# Patient Record
Sex: Female | Born: 1970 | Race: White | Hispanic: No | Marital: Married | Smoking: Former smoker
Health system: Southern US, Community
[De-identification: ages and names within clinical notes are randomized; demographics above are authoritative.]

## PROBLEM LIST (undated history)

## (undated) DIAGNOSIS — I10 Essential (primary) hypertension: Secondary | ICD-10-CM

## (undated) DIAGNOSIS — D649 Anemia, unspecified: Secondary | ICD-10-CM

## (undated) DIAGNOSIS — F329 Major depressive disorder, single episode, unspecified: Secondary | ICD-10-CM

## (undated) DIAGNOSIS — F32A Depression, unspecified: Secondary | ICD-10-CM

## (undated) DIAGNOSIS — F419 Anxiety disorder, unspecified: Secondary | ICD-10-CM

## (undated) DIAGNOSIS — K219 Gastro-esophageal reflux disease without esophagitis: Secondary | ICD-10-CM

## (undated) DIAGNOSIS — J449 Chronic obstructive pulmonary disease, unspecified: Secondary | ICD-10-CM

## (undated) DIAGNOSIS — R011 Cardiac murmur, unspecified: Secondary | ICD-10-CM

## (undated) HISTORY — DX: Major depressive disorder, single episode, unspecified: F32.9

## (undated) HISTORY — DX: Chronic obstructive pulmonary disease, unspecified: J44.9

## (undated) HISTORY — PX: OTHER SURGICAL HISTORY: SHX169

## (undated) HISTORY — DX: Gastro-esophageal reflux disease without esophagitis: K21.9

## (undated) HISTORY — DX: Cardiac murmur, unspecified: R01.1

## (undated) HISTORY — DX: Anemia, unspecified: D64.9

## (undated) HISTORY — PX: CHOLECYSTECTOMY: SHX55

## (undated) HISTORY — DX: Essential (primary) hypertension: I10

## (undated) HISTORY — DX: Depression, unspecified: F32.A

## (undated) HISTORY — DX: Anxiety disorder, unspecified: F41.9

---

## 1997-12-11 ENCOUNTER — Emergency Department (HOSPITAL_COMMUNITY): Admission: EM | Admit: 1997-12-11 | Discharge: 1997-12-11 | Payer: Self-pay | Admitting: Emergency Medicine

## 1998-08-21 ENCOUNTER — Emergency Department (HOSPITAL_COMMUNITY): Admission: EM | Admit: 1998-08-21 | Discharge: 1998-08-22 | Payer: Self-pay | Admitting: Emergency Medicine

## 1999-03-20 ENCOUNTER — Emergency Department (HOSPITAL_COMMUNITY): Admission: EM | Admit: 1999-03-20 | Discharge: 1999-03-20 | Payer: Self-pay

## 2002-01-05 ENCOUNTER — Emergency Department (HOSPITAL_COMMUNITY): Admission: EM | Admit: 2002-01-05 | Discharge: 2002-01-05 | Payer: Self-pay | Admitting: Emergency Medicine

## 2004-04-29 ENCOUNTER — Emergency Department (HOSPITAL_COMMUNITY): Admission: EM | Admit: 2004-04-29 | Discharge: 2004-04-29 | Payer: Self-pay | Admitting: Family Medicine

## 2007-01-27 ENCOUNTER — Emergency Department: Payer: Self-pay | Admitting: Emergency Medicine

## 2007-09-10 ENCOUNTER — Emergency Department (HOSPITAL_COMMUNITY): Admission: EM | Admit: 2007-09-10 | Discharge: 2007-09-10 | Payer: Self-pay | Admitting: Emergency Medicine

## 2010-02-23 ENCOUNTER — Encounter: Admission: RE | Admit: 2010-02-23 | Discharge: 2010-02-23 | Payer: Self-pay | Admitting: Internal Medicine

## 2010-11-13 ENCOUNTER — Other Ambulatory Visit: Payer: Self-pay | Admitting: Internal Medicine

## 2010-11-13 DIAGNOSIS — Z1231 Encounter for screening mammogram for malignant neoplasm of breast: Secondary | ICD-10-CM

## 2010-12-07 ENCOUNTER — Ambulatory Visit
Admission: RE | Admit: 2010-12-07 | Discharge: 2010-12-07 | Disposition: A | Discharge status: Home / Self Care | Payer: Medicaid Other | Source: Ambulatory Visit | Attending: Internal Medicine | Admitting: Internal Medicine

## 2010-12-07 DIAGNOSIS — Z1231 Encounter for screening mammogram for malignant neoplasm of breast: Secondary | ICD-10-CM

## 2011-03-18 LAB — I-STAT 8, (EC8 V) (CONVERTED LAB)
Chloride: 105
Glucose, Bld: 147 — ABNORMAL HIGH
Potassium: 3.3 — ABNORMAL LOW
pCO2, Ven: 38.3 — ABNORMAL LOW
pH, Ven: 7.453 — ABNORMAL HIGH

## 2015-02-07 ENCOUNTER — Ambulatory Visit (INDEPENDENT_AMBULATORY_CARE_PROVIDER_SITE_OTHER): Payer: Self-pay

## 2015-02-07 ENCOUNTER — Observation Stay (HOSPITAL_COMMUNITY)
Admission: EM | Admit: 2015-02-07 | Discharge: 2015-02-09 | Disposition: A | Discharge status: Home / Self Care | Payer: Self-pay | Attending: Internal Medicine | Admitting: Internal Medicine

## 2015-02-07 ENCOUNTER — Encounter (HOSPITAL_COMMUNITY): Payer: Self-pay | Admitting: *Deleted

## 2015-02-07 ENCOUNTER — Ambulatory Visit (INDEPENDENT_AMBULATORY_CARE_PROVIDER_SITE_OTHER): Payer: Self-pay | Admitting: Family Medicine

## 2015-02-07 VITALS — BP 208/102 | HR 78 | Temp 98.4°F | Resp 18 | Ht 62.0 in | Wt 214.0 lb

## 2015-02-07 DIAGNOSIS — D649 Anemia, unspecified: Secondary | ICD-10-CM | POA: Insufficient documentation

## 2015-02-07 DIAGNOSIS — R519 Headache, unspecified: Secondary | ICD-10-CM

## 2015-02-07 DIAGNOSIS — R011 Cardiac murmur, unspecified: Secondary | ICD-10-CM | POA: Insufficient documentation

## 2015-02-07 DIAGNOSIS — I161 Hypertensive emergency: Secondary | ICD-10-CM

## 2015-02-07 DIAGNOSIS — R0902 Hypoxemia: Secondary | ICD-10-CM | POA: Insufficient documentation

## 2015-02-07 DIAGNOSIS — R0609 Other forms of dyspnea: Secondary | ICD-10-CM

## 2015-02-07 DIAGNOSIS — I10 Essential (primary) hypertension: Secondary | ICD-10-CM

## 2015-02-07 DIAGNOSIS — J449 Chronic obstructive pulmonary disease, unspecified: Secondary | ICD-10-CM | POA: Insufficient documentation

## 2015-02-07 DIAGNOSIS — R9431 Abnormal electrocardiogram [ECG] [EKG]: Secondary | ICD-10-CM

## 2015-02-07 DIAGNOSIS — Z8249 Family history of ischemic heart disease and other diseases of the circulatory system: Secondary | ICD-10-CM

## 2015-02-07 DIAGNOSIS — K219 Gastro-esophageal reflux disease without esophagitis: Secondary | ICD-10-CM | POA: Insufficient documentation

## 2015-02-07 DIAGNOSIS — Z79899 Other long term (current) drug therapy: Secondary | ICD-10-CM | POA: Insufficient documentation

## 2015-02-07 DIAGNOSIS — R51 Headache: Secondary | ICD-10-CM | POA: Insufficient documentation

## 2015-02-07 DIAGNOSIS — Z720 Tobacco use: Secondary | ICD-10-CM | POA: Insufficient documentation

## 2015-02-07 DIAGNOSIS — F329 Major depressive disorder, single episode, unspecified: Secondary | ICD-10-CM | POA: Insufficient documentation

## 2015-02-07 DIAGNOSIS — R002 Palpitations: Secondary | ICD-10-CM

## 2015-02-07 DIAGNOSIS — I160 Hypertensive urgency: Secondary | ICD-10-CM | POA: Diagnosis present

## 2015-02-07 DIAGNOSIS — F419 Anxiety disorder, unspecified: Secondary | ICD-10-CM | POA: Insufficient documentation

## 2015-02-07 LAB — GLUCOSE, POCT (MANUAL RESULT ENTRY): POC Glucose: 102 mg/dl — AB (ref 70–99)

## 2015-02-07 LAB — EKG 12-LEAD

## 2015-02-07 MED ORDER — LORAZEPAM 2 MG/ML IJ SOLN
1.0000 mg | Freq: Once | INTRAMUSCULAR | Status: AC
  Administered 2015-02-08: 1 mg via INTRAVENOUS
  Filled 2015-02-07: qty 1

## 2015-02-07 MED ORDER — OXYCODONE-ACETAMINOPHEN 5-325 MG PO TABS
1.0000 | ORAL_TABLET | Freq: Once | ORAL | Status: AC
  Administered 2015-02-07: 1 via ORAL

## 2015-02-07 MED ORDER — OXYCODONE-ACETAMINOPHEN 5-325 MG PO TABS
ORAL_TABLET | ORAL | Status: AC
  Filled 2015-02-07: qty 1

## 2015-02-07 NOTE — ED Notes (Signed)
Pt states she took 50 mg metoprolol this morning at 11am.

## 2015-02-07 NOTE — Patient Instructions (Signed)
Go to [facility] emergency room for your blood pressure, value of her headache, and possible abnormal EKG. If any changes between now and your evaluation at the emergency room call 911. If follow-up needed after emergency room, call your primary care provider or you can return here if needed. I did advise the charge nurse you were on the way.

## 2015-02-07 NOTE — ED Provider Notes (Signed)
CSN: [id number]     Arrival date & time 02/07/15  2044 History  This chart was scribed for Varney Biles, MD by Hansel Feinstein, ED Scribe. This patient was seen in room A12C/A12C and the patient's care was started at 11:33 PM.     Chief Complaint  Patient presents with  . Hypertension   The history is provided by the patient. No language interpreter was used.    HPI Comments: Pamela Ho is a 44 y.o. female with hx of HTN, GERD, anxiety, depression, COPD referred by Urgent Care who presents to the Emergency Department complaining of moderate HTN (203/98 at home, 227/95 on arrival) onset tonight. She states associated diaphoresis onset 3 months ago and worsened 3 days ago (pt attributes this to anxiety and heat), intermittent blurry vision (with HA, but none right now), throbbing intermittent HA "worst of her life" localized to occipital and temporal region (relieved completely by Percocet on arrival) onset one month ago, recent unintentional weight loss (15lbs in the past month). Per pt, she checked her BP at a Pharmacy yesterday and it came back very high, so she went to Urgent Care. Pt notes that she woke up with her current HA and took 4 Ibuprofen throughout the day with moderate relief. She is a current smoker, 2ppd, and notes that she has not finished a pack today. She notes that she began smoking heavily 25 years ago, with 2 years of 2 ppd. Pt reports night sweats onset 3 years ago, but no new changes. Pt notes a recent change in corrective lenses Rx. No Hx of cerebral hemorrhage, DM, stroke, MI. FHx of cerebral hemorrhage on paternal grandmothers side after chemotherapy, but no FHx of cancer<40yo. Pt takes Zoloft and Klonopin for anxiety, Metoprolol for HTN. She denies nausea, vomiting, lightheadedness with the HA. She also denies CP, SOB, back pain.   With the headaches, there are no nausea, vomiting, visual complains, seizures, altered mental status, loss of consciousness, new weakness, or  numbness, no gait instability.   Past Medical History  Diagnosis Date  . Anemia   . Anxiety   . COPD (chronic obstructive pulmonary disease)   . Depression   . GERD (gastroesophageal reflux disease)   . Heart murmur   . Hypertension    Past Surgical History  Procedure Laterality Date  . Cholecystectomy     Family History  Problem Relation Age of Onset  . Diabetes Mother   . Heart disease Mother   . Hyperlipidemia Mother   . Diabetes Father   . Cancer Father   . Heart disease Father   . Hyperlipidemia Father   . Hypertension Father   . Heart disease Maternal Grandmother   . Hyperlipidemia Maternal Grandmother   . Hypertension Maternal Grandmother   . Heart disease Maternal Grandfather   . Hyperlipidemia Maternal Grandfather   . Hypertension Maternal Grandfather   . Heart disease Paternal Grandmother   . Hyperlipidemia Paternal Grandmother   . Hypertension Paternal Grandmother   . Stroke Paternal Grandmother   . Diabetes Paternal Grandmother   . Cancer Paternal Grandmother    Social History  Substance Use Topics  . Smoking status: Current Every Day Smoker -- 1.50 packs/day for 30 years    Types: Cigarettes  . Smokeless tobacco: None  . Alcohol Use: No   OB History    No data available     Review of Systems  Constitutional: Positive for diaphoresis and unexpected weight change.  Eyes: Positive for visual disturbance.  Respiratory: Negative for shortness of breath.   Cardiovascular: Negative for chest pain.       HTN  Gastrointestinal: Negative for nausea and vomiting.  Musculoskeletal: Negative for back pain.  Neurological: Positive for headaches. Negative for light-headedness.  A complete 10 system review of systems was obtained and all systems are negative except as noted in the HPI and PMH.    Allergies  Review of patient's allergies indicates no known allergies.  Home Medications   Prior to Admission medications   Medication Sig Start Date End Date  Taking? Authorizing Provider  clonazePAM (KLONOPIN) 0.5 MG tablet Take 0.5 mg by mouth 2 (two) times daily as needed for anxiety.   Yes Historical Provider, MD  ibuprofen (ADVIL,MOTRIN) 200 MG tablet Take 400 mg by mouth every 6 (six) hours as needed for mild pain or moderate pain.   Yes Historical Provider, MD  IRON PO Take 1 tablet by mouth daily.    Yes Historical Provider, MD  metoprolol (LOPRESSOR) 50 MG tablet Take 50 mg by mouth daily.   Yes Historical Provider, MD  omeprazole (PRILOSEC) 20 MG capsule Take 20 mg by mouth daily.   Yes Historical Provider, MD  pravastatin (PRAVACHOL) 20 MG tablet Take 20 mg by mouth daily.   Yes Historical Provider, MD  sertraline (ZOLOFT) 100 MG tablet Take 150 mg by mouth daily.   Yes Historical Provider, MD   BP 194/84 mmHg  Pulse 72  Temp(Src) 97.9 F (36.6 C) (Oral)  Resp 24  Ht _0  (1.575 m)  Wt 214 lb (97.07 kg)  BMI 39.13 kg/m2  SpO2 92%  LMP 10/04/2014 (Approximate) Physical Exam  Constitutional: She is oriented to person, place, and time. She appears well-developed and well-nourished.  HENT:  Head: Normocephalic and atraumatic.  Eyes: Conjunctivae and EOM are normal. Pupils are equal, round, and reactive to light.  Pupils are 17m and equal, reactive to light.  Neck: Normal range of motion. Neck supple.  Cardiovascular: Normal rate, regular rhythm and normal heart sounds.   No murmurs appreciated. 2+ and equal radial pulses bilaterally. 1+ and equal DP.  Pulmonary/Chest: Effort normal. No respiratory distress. She has rales.  Rales at the bases.   Abdominal: She exhibits no distension.  Musculoskeletal: Normal range of motion.  Neurological: She is alert and oriented to person, place, and time.  Cranial nerves 2-12 intact. Sensory exam for BUE and BLE equal and normal. Finger to nose exam normal.  Skin: Skin is warm and dry.  Psychiatric: She has a normal mood and affect. Her behavior is normal.  Nursing note and vitals  reviewed.  ED Course  Procedures (including critical care time) DIAGNOSTIC STUDIES: Oxygen Saturation is 94% on RA, adequate by my interpretation.    COORDINATION OF CARE: 11:45 PM Discussed treatment plan with pt at bedside and pt agreed to plan.   Labs Review Labs Reviewed  CBC WITH DIFFERENTIAL/PLATELET - Abnormal; Notable for the following:    RBC 6.01 (*)    Hemoglobin 16.1 (*)    HCT 51.4 (*)    RDW 18.4 (*)    Neutrophils Relative % 79 (*)    All other components within normal limits  BASIC METABOLIC PANEL - Abnormal; Notable for the following:    Glucose, Bld 129 (*)    All other components within normal limits  BRAIN NATRIURETIC PEPTIDE - Abnormal; Notable for the following:    B Natriuretic Peptide 276.3 (*)    All other components within normal limits  PROTIME-INR  APTT  TROPONIN I  D-DIMER, QUANTITATIVE (NOT AT [facility])    Imaging Review Dg Chest 2 View  02/07/2015   CLINICAL DATA:  Dyspnea on exertion  EXAM: CHEST  2 VIEW  COMPARISON:  02/23/2010  FINDINGS: Borderline cardiomegaly is chronic, accentuated by hypoventilation. Negative aortic and hilar contours.  Chronic interstitial coarsening which appears bronchitic. There is no edema, consolidation, effusion, or pneumothorax.  IMPRESSION: Chronic bronchitic markings and borderline cardiomegaly. No acute superimposed process.   Electronically Signed   By: Monte Fantasia M.D.   On: 02/07/2015 21:13   Ct Head Wo Contrast  02/08/2015   CLINICAL DATA:  Headache and high blood pressure, acute onset. Initial encounter.  EXAM: CT HEAD WITHOUT CONTRAST  TECHNIQUE: Contiguous axial images were obtained from the base of the skull through the vertex without intravenous contrast.  COMPARISON:  None.  FINDINGS: There is no evidence of acute infarction, mass lesion, or intra- or extra-axial hemorrhage on CT.  The posterior fossa, including the cerebellum, brainstem and fourth ventricle, is within normal limits. The third and lateral  ventricles, and basal ganglia are unremarkable in appearance. The cerebral hemispheres are symmetric in appearance, with normal gray-white differentiation. No mass effect or midline shift is seen.  There is no evidence of fracture; visualized osseous structures are unremarkable in appearance. The orbits are within normal limits. The paranasal sinuses and mastoid air cells are well-aerated. No significant soft tissue abnormalities are seen.  IMPRESSION: Unremarkable noncontrast CT of the head.   Electronically Signed   By: Garald Balding M.D.   On: 02/08/2015 02:50   I have personally reviewed and evaluated these images and lab results as part of my medical decision-making.   EKG Interpretation   Date/Time:  Wednesday February 08 2015 00:10:37 EDT Ventricular Rate:  69 PR Interval:  127 QRS Duration: 96 QT Interval:  420 QTC Calculation: 450 R Axis:   92 Text Interpretation:  Sinus rhythm Biatrial enlargement Borderline right  axis deviation Minimal ST depression, diffuse leads avR has mild < 1 mm ST  elevation with ST depression in the lateral leads. No old tracing to  compare Confirmed by Leeyah Heather, MD, Alexandros Ewan [phone]) on 02/08/2015 12:16:01 AM      _0 :00 am - Pt's highest MAP was close to 157mhg. PT was given ativan - now her MAP is 116 (200/90). We will order iv labetalol and oral hydralazine. Pt also has hypoxia  - 86% on room air. Will add dimer to screen for PE. Pt will need admission for her hypoxia and HTN emergency. Pt has a headache - will get CT head. She is no longer diaphoretic and certainly looks more calm than the first time around.  MDM   Final diagnoses:  Hypertensive emergency  Anxiety  Hypoxia    I personally performed the services described in this documentation, which was scribed in my presence. The recorded information has been reviewed and is accurate.  Pt comes in with cc of headaches, sweats, elevated BP. She is taking metroprolol as prescribed. Headache has  been present for > 1 month, it was severe - but currently she is pain free. Pain is waxing and waning. She has no focal neuro deficits. She had some blurry vision at one point. DDX: SAH, Brain AN, Thrombosis - but i am favoring hypertensive headaches given she is pain free with percocets, and reports that she thinks that pain got worse as she didn't have access to ibuprofen in the waiting room that she  usually takes for her headaches. CT head is normal.  Pt is noted to be hypoxic. She has no hx of O2 needs. She is a smoker. Pt has no asthma like findngs on the exam and the CXR is clear. Ambulatory pulsox is in the low 80s. Will need further workup. Dimer is pending due to lab not adding it on - Dr. Hal Hope will f/u.  BP: MAP was 150 mmhg at it's peak here. With ativan - as there was an element of anxiety, BP came down to map around 120. i gave patient iv labetalol and oral hydralazine 20 and 25 mg respectively, and the BP has stayed elevated - 190/84 on reassessment (MAP 90 mmhg).     Varney Biles, MD 02/08/15 [phone]

## 2015-02-07 NOTE — Progress Notes (Addendum)
Subjective:   This chart was scribed for Pamela Ray, MD by Erling Conte, Medical Scribe. This patient was seen in Room 1 and the patient's care was started at 6:32 PM.   Patient ID: Pamela Ho, female    DOB: [birth year], 44 y.o.   MRN: [id number]  Chief Complaint  Patient presents with  . Hypertension    Check BP at pharmacy twice yesterday was around: 201/91, checked BP today at mother house: 203/98  . Depression    Per triage screening    HPI Pamela Ho is a 44 y.o. female   PCP: Dr. Jilda Panda  Pt here for elevated blood pressure. She is a new pt to our office.  Elevated Blood Pressure She reports recent elevated BP readings. She checked her BP at a pharmacy yesterday and it was running at 201/91 and she checked again today and it was 203/98. Pt reports associated intermittent HAs for 1 month. She states the HA is worse in the morning as soon as she wakes up. She states the pain is exacerbated by lying down and is relieved when she sits up. She has been taking Ibuprofen for HA relief and has been taking 1600 mg daily. She was initially taking Tylenol for the first 2 weeks but felt that the Ibuprofen helped more. Pt has a h/o of HTN and was previously taking spironolactone, which she went off of 3 months ago. She began taking metoprolol 3 months ago which she said was for her heart palpitations. She admits she has not been taking the medication as prescribed and has only been taking 22m 1x daily and she is supposed to be taking the 50 mg 2x daily. She states when she was on BP medication previously her BP readings were in the 120s/70s. Pt notes she has been having intermittent SOB with exertion for 1 year but contributes it more to her weight than being related to her high BP. She has a family h/o cardiac issues through both parents. Her mother was 698when she had a massive coronary and her father was in his late 475s Pt's maternal grandmother had an intercerebral  bleed. She denies any h/o cardiac workup. She admits to having a EKG 2 years ago which was normal with the exception of PVCs. She states that she has been having some intermittent diaphoresis which she contributes to her menopause symptoms but states it has gotten worse with her elevated BP. She denies any h/o DM or elevated blood sugar. She is current everyday smoker. She denies any chest pain, SOB, nausea, vomiting, weakness, numbness, confusion, difficulty urinating, slurred speech or new/worsening blurred vision.   Depression Positive screening.  Depression screen PHQ 2/9 02/07/2015  Decreased Interest 3  Down, Depressed, Hopeless 3  PHQ - 2 Score 6  Altered sleeping 3  Tired, decreased energy 3  Change in appetite 3  Feeling bad or failure about yourself  3  Trouble concentrating 0  Moving slowly or fidgety/restless 0  Suicidal thoughts 0  PHQ-9 Score 18  Difficult doing work/chores Very difficult  He denies ay suicidal symptoms. She is currently on treatment for depression with Zoloft and Klonopin as needed for anxiety.    There are no active problems to display for this patient.  Past Medical History  Diagnosis Date  . Anemia   . Anxiety   . COPD (chronic obstructive pulmonary disease)   . Depression   . GERD (gastroesophageal reflux disease)   . Heart murmur   .  Hypertension    Past Surgical History  Procedure Laterality Date  . Cholecystectomy     Not on File Prior to Admission medications   Medication Sig Start Date End Date Taking? Authorizing Provider  clonazePAM (KLONOPIN) 0.5 MG tablet Take 0.5 mg by mouth 2 (two) times daily as needed for anxiety.   Yes Historical Provider, MD  IRON PO Take by mouth.   Yes Historical Provider, MD  metoprolol tartrate (LOPRESSOR) 25 MG tablet Take 25 mg by mouth 2 (two) times daily. Pt has only been taking one in the morning   Yes Historical Provider, MD  omeprazole (PRILOSEC) 20 MG capsule Take 20 mg by mouth daily.   Yes  Historical Provider, MD  pravastatin (PRAVACHOL) 10 MG tablet Take 10 mg by mouth daily.   Yes Historical Provider, MD  sertraline (ZOLOFT) 100 MG tablet Take 150 mg by mouth daily.   Yes Historical Provider, MD   Social History   Social History  . Marital Status: Divorced    Spouse Name: N/A  . Number of Children: N/A  . Years of Education: N/A   Occupational History  . Not on file.   Social History Main Topics  . Smoking status: Current Every Day Smoker -- 1.50 packs/day for 30 years    Types: Cigarettes  . Smokeless tobacco: Not on file  . Alcohol Use: No  . Drug Use: No  . Sexual Activity: Not on file   Other Topics Concern  . Not on file   Social History Narrative  . No narrative on file   Review of Systems  Constitutional: Positive for diaphoresis.  Eyes: Negative for visual disturbance.  Respiratory: Negative for shortness of breath.   Cardiovascular: Positive for palpitations (h/o). Negative for chest pain.  Gastrointestinal: Negative for nausea and vomiting.  Genitourinary: Negative for difficulty urinating.  Neurological: Positive for headaches. Negative for speech difficulty, weakness and numbness.  Psychiatric/Behavioral: Positive for dysphoric mood (h/o). Negative for suicidal ideas and confusion. The patient is nervous/anxious (h/o).        Objective:   Physical Exam  Constitutional: She is oriented to person, place, and time. She appears well-developed and well-nourished. No distress.  HENT:  Head: Normocephalic and atraumatic.  Eyes: Conjunctivae and EOM are normal. Pupils are equal, round, and reactive to light. Right eye exhibits no nystagmus. Left eye exhibits no nystagmus.  Color contacts in place  Neck: Neck supple. Carotid bruit is not present. No tracheal deviation present.  Cardiovascular: Normal rate, regular rhythm, normal heart sounds and intact distal pulses.   Pulmonary/Chest: Effort normal and breath sounds normal. No respiratory  distress.  Abdominal: Soft. She exhibits no pulsatile midline mass. There is no tenderness.  Musculoskeletal: Normal range of motion.  Neurological: She is alert and oriented to person, place, and time. She displays a negative Romberg sign.  No pronator drift. Normal finger to nose. Normal heel to toe. Non focal exam  Skin: Skin is warm. She is diaphoretic (sweat on brow).  Psychiatric: She has a normal mood and affect. Her behavior is normal.  Nursing note and vitals reviewed.   Filed Vitals:   02/07/15 1803 02/07/15 1817  BP: 204/104 220/104  Pulse: 78   Temp: 98.4 F (36.9 C)   TempSrc: Oral   Resp: 18   Height: _0  (1.575 m)   Weight: 214 lb (97.07 kg)   SpO2: 92%    [facility] reading (PRIMARY) by  Dr. Carlota Raspberry CXR: Cardiomegaly with few increased interstitial markings,  no discrete infiltrate  EKG reading by Dr. Carlota Raspberry Sinus rhythm rate 66. Non specific ST depression lateral leads and Lead I. Non specific t-wave in Lead III. No prior EKG available for review.  Results for orders placed or performed in visit on 02/07/15  POCT glucose (manual entry)  Result Value Ref Range   POC Glucose 102 (A) 70 - 99 mg/dl  EKG 12-Lead  Result Value Ref Range   FINAL DIAGNOSIS:     8:02 PM- results discussed. Repeat BP 208/102. HA still present but lessening. Denies CP or current palpitations or dyspnea. Recommended ER eval but she would like to go by private vehicle. Triage Nurse advised     Assessment & Plan:   TIFANI DACK is a 44 y.o. female Essential hypertension - Plan: Basic metabolic panel, POCT glucose (manual entry), EKG 12-Lead  Nonintractable episodic headache, unspecified headache type - Plan: TSH, POCT glucose (manual entry)  Palpitations - Plan: Basic metabolic panel, TSH, EKG 29-FAOZ  Dyspnea on exertion - Plan: DG Chest 2 View  Family history of early CAD  Nonspecific abnormal electrocardiogram (ECG) (EKG)  History of hyperlipidemia, hypertension,  palpitations, tobacco abuse, and family history of early cardiac disease in both mother and father.  Presents with elevated blood pressures noted at home for the past 2 days, but did not check prior to that for at least a few months. She had been treated with metoprolol 50 mg twice a day for palpitations, not sure if this was also for blood pressure, but has only been taking that once per day. She was also previously on spironolactone that she discontinued when starting metoprolol.  Additionally his complaint of headache and intermittent post past month with occasional blurry vision for what sounds like longer than the past month.   -Nonfocal neuro exam in office, but with elevated blood pressure at level seen here, hypertensive urgency discussed. Headache did improve while in office but did still persist.  -Abnormal EKG with nonspecific ST depression as above, and unfortunately no prior EKG available for comparison. She did note that a few years ago she did have an EKG and was told it was normal.  -Suspected cardiomegaly based on chest x-Ho. No apparent signs of failure in office, but concern of this with cardiomegaly, and dyspnea on exertion with flights of stairs, versus underlying deconditioning with obesity.   -With risk factors of tobacco use, hypertension, family history of early cardiac disease, and hyperlipidemia, recommended further evaluation in the emergency room tonight for this elevated blood pressure and possible abnormal EKG. She chose to go by private vehicle. Charge nurse advised.   Meds ordered this encounter  Medications  . sertraline (ZOLOFT) 100 MG tablet    Sig: Take 150 mg by mouth daily.  . clonazePAM (KLONOPIN) 0.5 MG tablet    Sig: Take 0.5 mg by mouth 2 (two) times daily as needed for anxiety.  . metoprolol tartrate (LOPRESSOR) 25 MG tablet    Sig: Take 25 mg by mouth 2 (two) times daily. Pt has only been taking one in the morning  . IRON PO    Sig: Take by mouth.  .  pravastatin (PRAVACHOL) 10 MG tablet    Sig: Take 10 mg by mouth daily.  Marland Kitchen omeprazole (PRILOSEC) 20 MG capsule    Sig: Take 20 mg by mouth daily.   Patient Instructions  [facility] to [facility] emergency room for your blood pressure, value of her headache, and possible abnormal EKG. If any changes between  now and your evaluation at the emergency room call 911. If follow-up needed after emergency room, call your primary care provider or you can return here if needed. I did advise the charge nurse you were on the way.     I personally performed the services described in this documentation, which was scribed in my presence. The recorded information has been reviewed and considered, and addended by me as needed.

## 2015-02-07 NOTE — ED Notes (Signed)
Pt reports hx of HTN, is supposed to take 50 mg Metoprolol BID but only takes it once in the morning. Went to [facility] d/t BP but they sent pt to ED.

## 2015-02-08 ENCOUNTER — Emergency Department (HOSPITAL_COMMUNITY): Payer: Self-pay

## 2015-02-08 ENCOUNTER — Encounter (HOSPITAL_COMMUNITY): Payer: Self-pay | Admitting: Radiology

## 2015-02-08 ENCOUNTER — Observation Stay (HOSPITAL_COMMUNITY): Payer: Medicaid Other

## 2015-02-08 DIAGNOSIS — R519 Headache, unspecified: Secondary | ICD-10-CM | POA: Diagnosis present

## 2015-02-08 DIAGNOSIS — I10 Essential (primary) hypertension: Secondary | ICD-10-CM

## 2015-02-08 DIAGNOSIS — R51 Headache: Secondary | ICD-10-CM

## 2015-02-08 DIAGNOSIS — I160 Hypertensive urgency: Secondary | ICD-10-CM | POA: Diagnosis present

## 2015-02-08 LAB — BASIC METABOLIC PANEL
Anion gap: 8 (ref 5–15)
BUN: 15 mg/dL (ref 6–20)
BUN: 18 mg/dL (ref 7–25)
CALCIUM: 9.7 mg/dL (ref 8.6–10.2)
CHLORIDE: 107 mmol/L (ref 101–111)
CO2: 24 mmol/L (ref 22–32)
CO2: 29 mmol/L (ref 20–31)
Calcium: 8.9 mg/dL (ref 8.9–10.3)
Chloride: 104 mmol/L (ref 98–110)
Creat: 1.17 mg/dL — ABNORMAL HIGH (ref 0.50–1.10)
Creatinine, Ser: 0.94 mg/dL (ref 0.44–1.00)
GFR calc Af Amer: >60 mL/min (ref >60)
GFR calc non Af Amer: >60 mL/min (ref >60)
GLUCOSE: 101 mg/dL — AB (ref 65–99)
GLUCOSE: 129 mg/dL — AB (ref 65–99)
POTASSIUM: 4.3 mmol/L (ref 3.5–5.1)
Potassium: 4.1 mmol/L (ref 3.5–5.3)
SODIUM: 144 mmol/L (ref 135–146)
Sodium: 139 mmol/L (ref 135–145)

## 2015-02-08 LAB — CBC WITH DIFFERENTIAL/PLATELET
BASOS PCT: 0 % (ref 0–1)
Basophils Absolute: 0 10*3/uL (ref 0.0–0.1)
Basophils Absolute: 0 10*3/uL (ref 0.0–0.1)
Basophils Relative: 0 % (ref 0–1)
EOS ABS: 0.1 10*3/uL (ref 0.0–0.7)
EOS PCT: 1 % (ref 0–5)
Eosinophils Absolute: 0.1 10*3/uL (ref 0.0–0.7)
Eosinophils Relative: 1 % (ref 0–5)
HCT: 51.4 % — ABNORMAL HIGH (ref 36.0–46.0)
HCT: 48 % — ABNORMAL HIGH (ref 36.0–46.0)
HEMOGLOBIN: 16.1 g/dL — AB (ref 12.0–15.0)
Hemoglobin: 14.7 g/dL (ref 12.0–15.0)
LYMPHS ABS: 1.2 10*3/uL (ref 0.7–4.0)
LYMPHS PCT: 16 % (ref 12–46)
Lymphocytes Relative: 18 % (ref 12–46)
Lymphs Abs: 1.1 10*3/uL (ref 0.7–4.0)
MCH: 26.4 pg (ref 26.0–34.0)
MCH: 26.8 pg (ref 26.0–34.0)
MCHC: 31.3 g/dL (ref 30.0–36.0)
MCHC: 30.6 g/dL (ref 30.0–36.0)
MCV: 85.5 fL (ref 78.0–100.0)
MCV: 86.3 fL (ref 78.0–100.0)
MONO ABS: 0.2 10*3/uL (ref 0.1–1.0)
MONOS PCT: 4 % (ref 3–12)
Monocytes Absolute: 0.3 10*3/uL (ref 0.1–1.0)
Monocytes Relative: 4 % (ref 3–12)
NEUTROS PCT: 79 % — AB (ref 43–77)
Neutro Abs: 6 10*3/uL (ref 1.7–7.7)
Neutro Abs: 4.4 10*3/uL (ref 1.7–7.7)
Neutrophils Relative %: 76 % (ref 43–77)
Platelets: 164 10*3/uL (ref 150–400)
Platelets: 123 10*3/uL — ABNORMAL LOW (ref 150–400)
RBC: 5.56 MIL/uL — ABNORMAL HIGH (ref 3.87–5.11)
RBC: 6.01 MIL/uL — AB (ref 3.87–5.11)
RDW: 18.4 % — ABNORMAL HIGH (ref 11.5–15.5)
RDW: 18.2 % — ABNORMAL HIGH (ref 11.5–15.5)
WBC: 5.8 10*3/uL (ref 4.0–10.5)
WBC: 7.7 10*3/uL (ref 4.0–10.5)

## 2015-02-08 LAB — COMPREHENSIVE METABOLIC PANEL
ALBUMIN: 3.3 g/dL — AB (ref 3.5–5.0)
ALT: 14 U/L (ref 14–54)
ANION GAP: 10 (ref 5–15)
AST: 16 U/L (ref 15–41)
Alkaline Phosphatase: 69 U/L (ref 38–126)
BUN: 13 mg/dL (ref 6–20)
CALCIUM: 9.1 mg/dL (ref 8.9–10.3)
CHLORIDE: 106 mmol/L (ref 101–111)
CO2: 23 mmol/L (ref 22–32)
Creatinine, Ser: 0.81 mg/dL (ref 0.44–1.00)
GFR calc non Af Amer: >60 mL/min (ref >60)
GLUCOSE: 115 mg/dL — AB (ref 65–99)
POTASSIUM: 3.1 mmol/L — AB (ref 3.5–5.1)
SODIUM: 139 mmol/L (ref 135–145)
Total Bilirubin: 0.7 mg/dL (ref 0.3–1.2)
Total Protein: 6.7 g/dL (ref 6.5–8.1)

## 2015-02-08 LAB — RAPID URINE DRUG SCREEN, HOSP PERFORMED
AMPHETAMINES: NOT DETECTED
BARBITURATES: NOT DETECTED
BENZODIAZEPINES: NOT DETECTED
COCAINE: NOT DETECTED
Opiates: NOT DETECTED
TETRAHYDROCANNABINOL: NOT DETECTED

## 2015-02-08 LAB — D-DIMER, QUANTITATIVE: D-Dimer, Quant: 0.51 ug/mL-FEU — ABNORMAL HIGH (ref 0.00–0.48)

## 2015-02-08 LAB — TSH
TSH: 2.851 u[IU]/mL (ref 0.350–4.500)
TSH: 4.048 u[IU]/mL (ref 0.350–4.500)

## 2015-02-08 LAB — APTT: aPTT: 30 seconds (ref 24–37)

## 2015-02-08 LAB — BRAIN NATRIURETIC PEPTIDE: B NATRIURETIC PEPTIDE 5: 276.3 pg/mL — AB (ref 0.0–100.0)

## 2015-02-08 LAB — PROTIME-INR
INR: 1.1 (ref 0.00–1.49)
Prothrombin Time: 14.4 seconds (ref 11.6–15.2)

## 2015-02-08 LAB — PREGNANCY, URINE: PREG TEST UR: NEGATIVE

## 2015-02-08 LAB — TROPONIN I

## 2015-02-08 MED ORDER — CLONAZEPAM 0.5 MG PO TABS
0.5000 mg | ORAL_TABLET | Freq: Two times a day (BID) | ORAL | Status: DC | PRN
  Administered 2015-02-08 – 2015-02-09 (×3): 0.5 mg via ORAL
  Filled 2015-02-08 (×4): qty 1

## 2015-02-08 MED ORDER — PANTOPRAZOLE SODIUM 40 MG PO TBEC
40.0000 mg | DELAYED_RELEASE_TABLET | Freq: Every day | ORAL | Status: DC
  Administered 2015-02-08 – 2015-02-09 (×2): 40 mg via ORAL
  Filled 2015-02-08 (×2): qty 1

## 2015-02-08 MED ORDER — FUROSEMIDE 10 MG/ML IJ SOLN
40.0000 mg | Freq: Once | INTRAMUSCULAR | Status: AC
  Administered 2015-02-08: 40 mg via INTRAVENOUS
  Filled 2015-02-08: qty 4

## 2015-02-08 MED ORDER — METOCLOPRAMIDE HCL 5 MG/ML IJ SOLN
10.0000 mg | Freq: Once | INTRAMUSCULAR | Status: AC
  Administered 2015-02-08: 10 mg via INTRAVENOUS
  Filled 2015-02-08: qty 2

## 2015-02-08 MED ORDER — SODIUM CHLORIDE 0.9 % IJ SOLN
3.0000 mL | Freq: Two times a day (BID) | INTRAMUSCULAR | Status: DC
  Administered 2015-02-08 – 2015-02-09 (×3): 3 mL via INTRAVENOUS

## 2015-02-08 MED ORDER — SPIRONOLACTONE 25 MG PO TABS
25.0000 mg | ORAL_TABLET | Freq: Every day | ORAL | Status: DC
  Administered 2015-02-08 – 2015-02-09 (×2): 25 mg via ORAL
  Filled 2015-02-08 (×2): qty 1

## 2015-02-08 MED ORDER — ONDANSETRON HCL 4 MG PO TABS: 4.0000 mg | ORAL_TABLET | Freq: Four times a day (QID) | ORAL | Status: DC | PRN

## 2015-02-08 MED ORDER — METOPROLOL TARTRATE 50 MG PO TABS
50.0000 mg | ORAL_TABLET | Freq: Two times a day (BID) | ORAL | Status: DC
  Administered 2015-02-08 – 2015-02-09 (×2): 50 mg via ORAL
  Filled 2015-02-08 (×2): qty 1

## 2015-02-08 MED ORDER — HYDRALAZINE HCL 20 MG/ML IJ SOLN
10.0000 mg | INTRAMUSCULAR | Status: DC | PRN
  Administered 2015-02-08 – 2015-02-09 (×3): 10 mg via INTRAVENOUS
  Filled 2015-02-08 (×3): qty 1

## 2015-02-08 MED ORDER — SERTRALINE HCL 50 MG PO TABS
150.0000 mg | ORAL_TABLET | Freq: Every day | ORAL | Status: DC
  Administered 2015-02-08 – 2015-02-09 (×2): 150 mg via ORAL
  Filled 2015-02-08 (×4): qty 1

## 2015-02-08 MED ORDER — ONDANSETRON HCL 4 MG/2ML IJ SOLN: 4.0000 mg | Freq: Four times a day (QID) | INTRAMUSCULAR | Status: DC | PRN

## 2015-02-08 MED ORDER — ACETAMINOPHEN 325 MG PO TABS
650.0000 mg | ORAL_TABLET | Freq: Four times a day (QID) | ORAL | Status: DC | PRN
  Administered 2015-02-08 – 2015-02-09 (×4): 650 mg via ORAL
  Filled 2015-02-08 (×4): qty 2

## 2015-02-08 MED ORDER — ACETAMINOPHEN 650 MG RE SUPP: 650.0000 mg | Freq: Four times a day (QID) | RECTAL | Status: DC | PRN

## 2015-02-08 MED ORDER — KETOROLAC TROMETHAMINE 30 MG/ML IJ SOLN
30.0000 mg | Freq: Once | INTRAMUSCULAR | Status: AC
Start: 2015-02-08 — End: 2015-02-08
  Administered 2015-02-08: 30 mg via INTRAVENOUS
  Filled 2015-02-08: qty 1

## 2015-02-08 MED ORDER — LISINOPRIL 10 MG PO TABS
10.0000 mg | ORAL_TABLET | Freq: Every day | ORAL | Status: DC
  Administered 2015-02-08 – 2015-02-09 (×2): 10 mg via ORAL
  Filled 2015-02-08 (×2): qty 1

## 2015-02-08 MED ORDER — PRAVASTATIN SODIUM 20 MG PO TABS
20.0000 mg | ORAL_TABLET | Freq: Every day | ORAL | Status: DC
  Administered 2015-02-08 – 2015-02-09 (×2): 20 mg via ORAL
  Filled 2015-02-08 (×2): qty 1

## 2015-02-08 MED ORDER — LABETALOL HCL 5 MG/ML IV SOLN
20.0000 mg | Freq: Once | INTRAVENOUS | Status: AC
  Administered 2015-02-08: 20 mg via INTRAVENOUS
  Filled 2015-02-08: qty 4

## 2015-02-08 MED ORDER — IOHEXOL 350 MG/ML SOLN
100.0000 mL | Freq: Once | INTRAVENOUS | Status: AC | PRN
  Administered 2015-02-08: 100 mL via INTRAVENOUS

## 2015-02-08 MED ORDER — HYDRALAZINE HCL 25 MG PO TABS
25.0000 mg | ORAL_TABLET | Freq: Once | ORAL | Status: AC
  Administered 2015-02-08: 25 mg via ORAL
  Filled 2015-02-08: qty 1

## 2015-02-08 MED ORDER — ENSURE ENLIVE PO LIQD
237.0000 mL | Freq: Two times a day (BID) | ORAL | Status: DC
  Administered 2015-02-08: 237 mL via ORAL

## 2015-02-08 MED ORDER — METOPROLOL TARTRATE 50 MG PO TABS
50.0000 mg | ORAL_TABLET | Freq: Every day | ORAL | Status: DC
  Administered 2015-02-08: 50 mg via ORAL
  Filled 2015-02-08: qty 1

## 2015-02-08 NOTE — Progress Notes (Signed)
[facility] PROGRESS NOTE   MARISAL SWAREY TTS:[id number] DOB: 1970/10/19 DOA: 02/07/2015 PCP: Jilda Panda, MD  HPI/Subjective: Denies any chest pain or shortness of breath., Denies any headache.  Assessment/Plan: Principal Problem:   Hypertensive urgency Active Problems:   Headache   Patient seen and examined earlier today by my colleague Dr. Hal Hope. This is a no chart note. Presented with headache and elevated blood pressure, blood pressure medications restarted. Slightly elevated BNP, give 1 dose of Lasix. Hypokalemia, replete with oral supplements.  Code Status: Full Code Family Communication: Plan discussed with the patient. Disposition Plan: Remains inpatient Diet: Diet Heart Room service appropriate?: Yes; Fluid consistency:: Thin  Consultants:  none  Procedures:  none  Antibiotics:  none   Objective: Filed Vitals:   02/08/15 1122  BP:   Pulse:   Temp: 98.5 F (36.9 C)  Resp:    No intake or output data in the 24 hours ending 02/08/15 1239 Filed Weights   02/07/15 2131 02/08/15 0701  Weight: 97.07 kg (214 lb) 99.156 kg (218 lb 9.6 oz)    Exam: General: Alert and awake, oriented x3, not in any acute distress. HEENT: anicteric sclera, pupils reactive to light and accommodation, EOMI CVS: S1-S2 clear, no murmur rubs or gallops Chest: clear to auscultation bilaterally, no wheezing, rales or rhonchi Abdomen: soft nontender, nondistended, normal bowel sounds, no organomegaly Extremities: no cyanosis, clubbing or edema noted bilaterally Neuro: Cranial nerves II-XII intact, no focal neurological deficits  Data Reviewed: Basic Metabolic Panel:  Recent Labs Lab 02/08/15 02/08/15 0926  NA 139 139  K 4.3 3.1*  CL 107 106  CO2 24 23  GLUCOSE 129* 115*  BUN 15 13  CREATININE 0.94 0.81  CALCIUM 8.9 9.1   Liver Function Tests:  Recent Labs Lab 02/08/15 0926  AST 16  ALT 14  ALKPHOS 69  BILITOT 0.7  PROT 6.7  ALBUMIN 3.3*    No results for input(s): LIPASE, AMYLASE in the last 168 hours. No results for input(s): AMMONIA in the last 168 hours. CBC:  Recent Labs Lab 02/08/15 02/08/15 0926  WBC 7.7 5.8  NEUTROABS 6.0 4.4  HGB 16.1* 14.7  HCT 51.4* 48.0*  MCV 85.5 86.3  PLT 164 123*   Cardiac Enzymes:  Recent Labs Lab 02/08/15  TROPONINI <0.03   BNP (last 3 results)  Recent Labs  02/08/15  BNP 276.3*    ProBNP (last 3 results) No results for input(s): PROBNP in the last 8760 hours.  CBG: No results for input(s): GLUCAP in the last 168 hours.  Micro No results found for this or any previous visit (from the past 240 hour(s)).   Studies: Dg Chest 2 View  02/07/2015   CLINICAL DATA:  Dyspnea on exertion  EXAM: CHEST  2 VIEW  COMPARISON:  02/23/2010  FINDINGS: Borderline cardiomegaly is chronic, accentuated by hypoventilation. Negative aortic and hilar contours.  Chronic interstitial coarsening which appears bronchitic. There is no edema, consolidation, effusion, or pneumothorax.  IMPRESSION: Chronic bronchitic markings and borderline cardiomegaly. No acute superimposed process.   Electronically Signed   By: Monte Fantasia M.D.   On: 02/07/2015 21:13   Ct Head Wo Contrast  02/08/2015   CLINICAL DATA:  Headache and high blood pressure, acute onset. Initial encounter.  EXAM: CT HEAD WITHOUT CONTRAST  TECHNIQUE: Contiguous axial images were obtained from the base of the skull through the vertex without intravenous contrast.  COMPARISON:  None.  FINDINGS: There is no evidence of acute infarction, mass lesion, or intra-  or extra-axial hemorrhage on CT.  The posterior fossa, including the cerebellum, brainstem and fourth ventricle, is within normal limits. The third and lateral ventricles, and basal ganglia are unremarkable in appearance. The cerebral hemispheres are symmetric in appearance, with normal gray-white differentiation. No mass effect or midline shift is seen.  There is no evidence of fracture;  visualized osseous structures are unremarkable in appearance. The orbits are within normal limits. The paranasal sinuses and mastoid air cells are well-aerated. No significant soft tissue abnormalities are seen.  IMPRESSION: Unremarkable noncontrast CT of the head.   Electronically Signed   By: Garald Balding M.D.   On: 02/08/2015 02:50   Ct Angio Chest Pe W/cm &/or Wo Cm  02/08/2015   CLINICAL DATA:  44 year old female with hypoxia and chest pain for several days. Initial encounter.  EXAM: CT ANGIOGRAPHY CHEST WITH CONTRAST  TECHNIQUE: Multidetector CT imaging of the chest was performed using the standard protocol during bolus administration of intravenous contrast. Multiplanar CT image reconstructions and MIPs were obtained to evaluate the vascular anatomy.  CONTRAST:  160m OMNIPAQUE IOHEXOL 350 MG/ML SOLN  COMPARISON:  Chest radiographs 02/07/2015 and earlier.  FINDINGS: Adequate contrast bolus timing in the pulmonary arterial tree. Mild upper lobe respiratory motion artifact. No focal filling defect identified in the pulmonary arterial tree to suggest the presence of acute pulmonary embolism.  Major airways are patent. Low lung volumes with bilateral widespread pulmonary mosaic attenuation. Small calcified granuloma in the right lower lobe. No consolidation. No pleural effusion.  Cardiomegaly. No pericardial effusion. Increased bilateral hilar lymph node tissue, up to 8 mm in thickness. Similar mildly increased precarinal and right peritracheal nodal tissue, up to 10 mm in thickness. No calcified mediastinal nodes. Lymph nodes also appear increased at the thoracic inlet in number, and to a lesser extent size measuring up to 10 mm (left level 4 station). There is no axillary lymphadenopathy.  There is splenomegaly in the abdomen, with splenic volume greater than or equal to 719 mL (normal splenic volume range 83 - 412 mL). No discrete splenic lesion is identified. No upper abdominal lymphadenopathy. There is  a partially visible 17 mm left adrenal nodule. Mild right adrenal thickening.  Surgically absent gallbladder. Negative visualized liver, pancreas, and bowel in the upper abdomen. Negative visualized aorta.  No osseous abnormality identified.  Review of the MIP images confirms the above findings.  IMPRESSION: 1.  No evidence of acute pulmonary embolus. 2. Increased number of 10 mm or smaller thoracic inlet, mediastinal, and hilar lymph nodes. Superimposed splenomegaly. No axillary or visualized upper abdominal lymphadenopathy. Still, this constellation is suspicious for a lymphoproliferative disorder, or less likely chronic inflammatory process such as sarcoidosis. 3. Low lung volumes with pulmonary mosaic attenuation which might reflect pulmonary small airway or small vessel disease. 4. Cardiomegaly.  No pericardial or pleural effusion.   Electronically Signed   By: HGenevie AnnM.D.   On: 02/08/2015 10:27    Scheduled Meds: . feeding supplement (ENSURE ENLIVE)  237 mL Oral BID BM  . metoprolol  50 mg Oral Daily  . pantoprazole  40 mg Oral Daily  . pravastatin  20 mg Oral Daily  . sertraline  150 mg Oral Daily  . sodium chloride  3 mL Intravenous Q12H  . spironolactone  25 mg Oral Daily   Continuous Infusions:      Time spent: 35 minutes    EPark[facility] HospitalA  [facility] Pager [phone] 7PM-7AM, please contact night-coverage at [email or web address], password [facility]  02/08/2015, 12:39 PM

## 2015-02-08 NOTE — ED Notes (Signed)
Patient transported to CT 

## 2015-02-08 NOTE — ED Notes (Signed)
Pt ambulated without assistance. Her SPO2 was 91% initially. Once she started walking it fluctuated between 84%-88%. Pt was able to speak in full sentences during this. Her respirations remained constant and work of breathing remained normal. Pt tolerated well.

## 2015-02-08 NOTE — H&P (Signed)
[facility] History and Physical  Pamela Ho YJW:[id number] DOB: 1971/01/02 DOA: 02/07/2015  Referring physician: Dr.Nanvati. PCP: No primary care provider on file. Dr.Moreiro. Specialists: None.  Chief Complaint: Headache. Elevated blood pressure.  HPI: Pamela Ho is a 44 y.o. female history of hypertension and ongoing tobacco abuse presents to the ER because of headache. Patient also is noted that her blood pressure was very high. Patient has been having headache which was globally. With no focal deficits. In the ER patient's blood pressure was found to be more than 037 systolic. Patient also was mildly hypoxic in the ER. Chest x-ray shows chronic bronchitis changes. On exam patient was not wheezing. Patient was given labetalol and IV hydralazine for which patient blood pressure decreased to 190 but still started going back up again. Patient states she is presently on metoprolol but previously she was to be on spironolactone which she discontinued 2 months ago fearing that her blood pressure Medrol. Patient denies any chest pain or shortness of breath. Patient has been admitted for further observation and management of hypertensive urgency.   Review of Systems: As presented in the history of presenting illness, rest negative.  Past Medical History  Diagnosis Date  . Anemia   . Anxiety   . COPD (chronic obstructive pulmonary disease)   . Depression   . GERD (gastroesophageal reflux disease)   . Heart murmur   . Hypertension    Past Surgical History  Procedure Laterality Date  . Cholecystectomy    . Cyst from neck     Social History:  reports that she has been smoking Cigarettes.  She has a 45 pack-year smoking history. She does not have any smokeless tobacco history on file. She reports that she does not drink alcohol or use illicit drugs. Where does patient live home. Can patient participate in ADLs? Yes.  No Known Allergies  Family History:  Family History   Problem Relation Age of Onset  . Diabetes Mother   . Heart disease Mother   . Hyperlipidemia Mother   . Diabetes Father   . Cancer Father   . Heart disease Father   . Hyperlipidemia Father   . Hypertension Father   . Heart disease Maternal Grandmother   . Hyperlipidemia Maternal Grandmother   . Hypertension Maternal Grandmother   . Heart disease Maternal Grandfather   . Hyperlipidemia Maternal Grandfather   . Hypertension Maternal Grandfather   . Heart disease Paternal Grandmother   . Hyperlipidemia Paternal Grandmother   . Hypertension Paternal Grandmother   . Stroke Paternal Grandmother   . Diabetes Paternal Grandmother   . Cancer Paternal Grandmother       Prior to Admission medications   Medication Sig Start Date End Date Taking? Authorizing Provider  clonazePAM (KLONOPIN) 0.5 MG tablet Take 0.5 mg by mouth 2 (two) times daily as needed for anxiety.   Yes Historical Provider, MD  ibuprofen (ADVIL,MOTRIN) 200 MG tablet Take 400 mg by mouth every 6 (six) hours as needed for mild pain or moderate pain.   Yes Historical Provider, MD  IRON PO Take 1 tablet by mouth daily.    Yes Historical Provider, MD  metoprolol (LOPRESSOR) 50 MG tablet Take 50 mg by mouth daily.   Yes Historical Provider, MD  omeprazole (PRILOSEC) 20 MG capsule Take 20 mg by mouth daily.   Yes Historical Provider, MD  pravastatin (PRAVACHOL) 20 MG tablet Take 20 mg by mouth daily.   Yes Historical Provider, MD  sertraline (ZOLOFT) 100  MG tablet Take 150 mg by mouth daily.   Yes Historical Provider, MD    Physical Exam: Filed Vitals:   02/08/15 0515 02/08/15 0530 02/08/15 0637 02/08/15 0645  BP: 192/91 202/94 196/80 192/87  Pulse: 65 66 68 68  Temp:      TempSrc:      Resp:      Height:      Weight:      SpO2: 95% 97% 96% 96%     General:  Moderately built and nourished.  Eyes: Anicteric no pallor.  ENT: No discharge from the ears eyes nose and mouth.  Neck: No mass felt. No neck  rigidity.  Cardiovascular: S1-S2 heard.  Respiratory: No rhonchi or crepitations.  Abdomen: Soft nontender bowel sounds present.  Skin: No rash.  Musculoskeletal: No edema.  Psychiatric: Appears normal.  Neurologic: Alert awake oriented to time place and person. Moves all extremities.  Labs on Admission:  Basic Metabolic Panel:  Recent Labs Lab 02/08/15  NA 139  K 4.3  CL 107  CO2 24  GLUCOSE 129*  BUN 15  CREATININE 0.94  CALCIUM 8.9   Liver Function Tests: No results for input(s): AST, ALT, ALKPHOS, BILITOT, PROT, ALBUMIN in the last 168 hours. No results for input(s): LIPASE, AMYLASE in the last 168 hours. No results for input(s): AMMONIA in the last 168 hours. CBC:  Recent Labs Lab 02/08/15  WBC 7.7  NEUTROABS 6.0  HGB 16.1*  HCT 51.4*  MCV 85.5  PLT 164   Cardiac Enzymes:  Recent Labs Lab 02/08/15  TROPONINI <0.03    BNP (last 3 results)  Recent Labs  02/08/15  BNP 276.3*    ProBNP (last 3 results) No results for input(s): PROBNP in the last 8760 hours.  CBG: No results for input(s): GLUCAP in the last 168 hours.  Radiological Exams on Admission: Dg Chest 2 View  02/07/2015   CLINICAL DATA:  Dyspnea on exertion  EXAM: CHEST  2 VIEW  COMPARISON:  02/23/2010  FINDINGS: Borderline cardiomegaly is chronic, accentuated by hypoventilation. Negative aortic and hilar contours.  Chronic interstitial coarsening which appears bronchitic. There is no edema, consolidation, effusion, or pneumothorax.  IMPRESSION: Chronic bronchitic markings and borderline cardiomegaly. No acute superimposed process.   Electronically Signed   By: Monte Fantasia M.D.   On: 02/07/2015 21:13   Ct Head Wo Contrast  02/08/2015   CLINICAL DATA:  Headache and high blood pressure, acute onset. Initial encounter.  EXAM: CT HEAD WITHOUT CONTRAST  TECHNIQUE: Contiguous axial images were obtained from the base of the skull through the vertex without intravenous contrast.  COMPARISON:   None.  FINDINGS: There is no evidence of acute infarction, mass lesion, or intra- or extra-axial hemorrhage on CT.  The posterior fossa, including the cerebellum, brainstem and fourth ventricle, is within normal limits. The third and lateral ventricles, and basal ganglia are unremarkable in appearance. The cerebral hemispheres are symmetric in appearance, with normal gray-white differentiation. No mass effect or midline shift is seen.  There is no evidence of fracture; visualized osseous structures are unremarkable in appearance. The orbits are within normal limits. The paranasal sinuses and mastoid air cells are well-aerated. No significant soft tissue abnormalities are seen.  IMPRESSION: Unremarkable noncontrast CT of the head.   Electronically Signed   By: Garald Balding M.D.   On: 02/08/2015 02:50    EKG: Independently reviewed. Normal sinus rhythm with nonspecific ST changes.  Assessment/Plan Principal Problem:   Hypertensive urgency Active Problems:  Headache   1. Hypertensive urgency - at this time I have restarted patient on spironolactone and patient is agreeable to that. Continue metoprolol. When necessary IV hydralazine for systolic blood pressure more than 180. Closely observe. 2. Headache - probably secondary to uncontrolled blood pressure. Presently improved. Patient is nonfocal. CT head was unremarkable. Closely observe. 3. Tobacco abuse - tobacco cessation counseling requested.  Since patient was mildly hypoxic and d-dimer was positive CT angiogram of the chest was ordered which is pending.   DVT Prophylaxis SCDs.  Code Status: Full code.  Family Communication: Discussed with patient.  Disposition Plan: Admit for observation.    Lesslie Mckeehan N. [facility] Pager [phone].  If 7PM-7AM, please contact night-coverage [email or web address] Password [employer] 02/08/2015, 6:52 AM

## 2015-02-08 NOTE — ED Notes (Signed)
Attempted to call report to Janett Billow, RN on [address]. Report not received. Reports orders needed for blood pressure on floor. Number left with floor nurse to return call.

## 2015-02-09 DIAGNOSIS — R51 Headache: Secondary | ICD-10-CM

## 2015-02-09 LAB — BASIC METABOLIC PANEL
Anion gap: 8 (ref 5–15)
BUN: 12 mg/dL (ref 6–20)
CO2: 27 mmol/L (ref 22–32)
CREATININE: 0.79 mg/dL (ref 0.44–1.00)
Calcium: 9.2 mg/dL (ref 8.9–10.3)
Chloride: 104 mmol/L (ref 101–111)
GFR calc Af Amer: >60 mL/min (ref >60)
GLUCOSE: 125 mg/dL — AB (ref 65–99)
POTASSIUM: 2.9 mmol/L — AB (ref 3.5–5.1)
SODIUM: 139 mmol/L (ref 135–145)

## 2015-02-09 MED ORDER — LISINOPRIL 10 MG PO TABS: 10.0000 mg | ORAL_TABLET | Freq: Every day | ORAL | Status: DC

## 2015-02-09 MED ORDER — SPIRONOLACTONE 25 MG PO TABS: 25.0000 mg | ORAL_TABLET | Freq: Every day | ORAL | Status: DC

## 2015-02-09 MED ORDER — METOPROLOL TARTRATE 50 MG PO TABS: 50.0000 mg | ORAL_TABLET | Freq: Two times a day (BID) | ORAL | Status: DC

## 2015-02-09 MED ORDER — POTASSIUM CHLORIDE CRYS ER 20 MEQ PO TBCR
60.0000 meq | EXTENDED_RELEASE_TABLET | Freq: Four times a day (QID) | ORAL | Status: AC
  Administered 2015-02-09 (×2): 60 meq via ORAL
  Filled 2015-02-09 (×2): qty 3

## 2015-02-09 NOTE — Discharge Summary (Signed)
Physician Discharge Summary  Pamela Ho HBZ:[id number] DOB: Feb 16, 1971 DOA: 02/07/2015  PCP: Jilda Panda, MD  Admit date: 02/07/2015 Discharge date: 02/09/2015  Time spent: 40 minutes  Recommendations for Outpatient Follow-up:  1. Follow-up with primary care physician within one week. 2. Metoprolol change to 50 mg twice a day, added lisinopril 10 mg and restarted Aldactone at 25 mg daily. 3. Check BMP specifically for potassium and renal function.  Discharge Diagnoses:  Principal Problem:   Hypertensive urgency Active Problems:   Headache   Discharge Condition: Stable  Diet recommendation: Heart healthy  Filed Weights   02/07/15 2131 02/08/15 0701 02/09/15 0400  Weight: 97.07 kg (214 lb) 99.156 kg (218 lb 9.6 oz) 97.614 kg (215 lb 3.2 oz)    History of present illness:  Pamela Ho is a 44 y.o. female history of hypertension and ongoing tobacco abuse presents to the ER because of headache. Patient also is noted that her blood pressure was very high. Patient has been having headache which was globally. With no focal deficits. In the ER patient's blood pressure was found to be more than 101 systolic. Patient also was mildly hypoxic in the ER. Chest x-ray shows chronic bronchitis changes. On exam patient was not wheezing. Patient was given labetalol and IV hydralazine for which patient blood pressure decreased to 190 but still started going back up again. Patient states she is presently on metoprolol but previously she was to be on spironolactone which she discontinued 2 months ago fearing that her blood pressure Medrol. Patient denies any chest pain or shortness of breath. Patient has been admitted for further observation and management of hypertensive urgency.   Hospital Course:   Accelerated hypertension Patient presented to the hospital with global headache, his BP off more than 200. Patient reported that she was on 50 mg of metoprolol twice a day and 25 mg of Aldactone  which was changed recently. Started on twice a day metoprolol, added lisinopril and put back on Aldactone 25 mg. Blood pressure doing much better. She has negative troponin. Patient should get 2-D echo and hemoglobin A1c, patient prefers this to be done as outpatient. Patient needs outpatient follow-up with cardiology, benefit from evaluation with 2-D echo.  Headache This is could be secondary to her accelerated hypertension, this is resolved. CT scan of the head was done and showed no evidence of abnormalities.  Hypoxia Patient was hypoxic in the emergency department with oxygen saturation nadir of 84%. Has slightly positive d-dimer of 0.51, CT angiography was done negative for abnormalities. This is resolved. Please not that patient smokes one pack per day down from 2 packs per day.  Tobacco abuse Patient smokes one pack per day down from 2 packs per day, been doing that for some time. Patient counseled extensively. Follow-up with PCP.  Splenomegaly Patient has slight lymphadenopathy and splenomegaly, incidentally noticed in the CT scan. Further follow-up as outpatient is needed, she will need follow-up CT, if still has splenomegaly/lymphadenopathy might re-assess for lymphoproliferative disorders versus sarcoidosis.  Hypokalemia Presented with potassium of 3.1, this is worsened after patient got 40 mg of Lasix 2.9 (though she got 40 mEq of potassium) Patient got total of 120 mEq oral potassium prior to discharge. Patient will be on lisinopril and Aldactone as outpatient, BMP within 1 week.  Morbid obesity Patient will probably benefit from outpatient polysomnogram.  Procedures:  None  Consultations:  None  Discharge Exam: Filed Vitals:   02/09/15 0946  BP: 144/81  Pulse: 64  Temp:  Resp:    General: Alert and awake, oriented x3, not in any acute distress. HEENT: anicteric sclera, pupils reactive to light and accommodation, EOMI CVS: S1-S2 clear, no murmur rubs  or gallops Chest: clear to auscultation bilaterally, no wheezing, rales or rhonchi Abdomen: soft nontender, nondistended, normal bowel sounds, no organomegaly Extremities: no cyanosis, clubbing or edema noted bilaterally Neuro: Cranial nerves II-XII intact, no focal neurological deficits  Discharge Instructions   Discharge Instructions    Diet - low sodium heart healthy    Complete by:  As directed      Increase activity slowly    Complete by:  As directed           Current Discharge Medication List    START taking these medications   Details  lisinopril (PRINIVIL,ZESTRIL) 10 MG tablet Take 1 tablet (10 mg total) by mouth daily. Qty: 30 tablet, Refills: 0    spironolactone (ALDACTONE) 25 MG tablet Take 1 tablet (25 mg total) by mouth daily. Qty: 30 tablet, Refills: 0      CONTINUE these medications which have CHANGED   Details  metoprolol (LOPRESSOR) 50 MG tablet Take 1 tablet (50 mg total) by mouth 2 (two) times daily. Qty: 60 tablet, Refills: 0      CONTINUE these medications which have NOT CHANGED   Details  clonazePAM (KLONOPIN) 0.5 MG tablet Take 0.5 mg by mouth 2 (two) times daily as needed for anxiety.    IRON PO Take 1 tablet by mouth daily.     omeprazole (PRILOSEC) 20 MG capsule Take 20 mg by mouth daily.    pravastatin (PRAVACHOL) 20 MG tablet Take 20 mg by mouth daily.    sertraline (ZOLOFT) 100 MG tablet Take 150 mg by mouth daily.      STOP taking these medications     ibuprofen (ADVIL,MOTRIN) 200 MG tablet        No Known Allergies Follow-up Information    Follow up with Jilda Panda, MD In 1 week.   Specialty:  Internal Medicine   Contact information:   [address] [place] [address] [phone]        The results of significant diagnostics from this hospitalization (including imaging, microbiology, ancillary and laboratory) are listed below for reference.    Significant Diagnostic Studies: Dg Chest 2 View  02/07/2015    CLINICAL DATA:  Dyspnea on exertion  EXAM: CHEST  2 VIEW  COMPARISON:  02/23/2010  FINDINGS: Borderline cardiomegaly is chronic, accentuated by hypoventilation. Negative aortic and hilar contours.  Chronic interstitial coarsening which appears bronchitic. There is no edema, consolidation, effusion, or pneumothorax.  IMPRESSION: Chronic bronchitic markings and borderline cardiomegaly. No acute superimposed process.   Electronically Signed   By: Monte Fantasia M.D.   On: 02/07/2015 21:13   Ct Head Wo Contrast  02/08/2015   CLINICAL DATA:  Headache and high blood pressure, acute onset. Initial encounter.  EXAM: CT HEAD WITHOUT CONTRAST  TECHNIQUE: Contiguous axial images were obtained from the base of the skull through the vertex without intravenous contrast.  COMPARISON:  None.  FINDINGS: There is no evidence of acute infarction, mass lesion, or intra- or extra-axial hemorrhage on CT.  The posterior fossa, including the cerebellum, brainstem and fourth ventricle, is within normal limits. The third and lateral ventricles, and basal ganglia are unremarkable in appearance. The cerebral hemispheres are symmetric in appearance, with normal gray-white differentiation. No mass effect or midline shift is seen.  There is no evidence of fracture; visualized osseous structures  are unremarkable in appearance. The orbits are within normal limits. The paranasal sinuses and mastoid air cells are well-aerated. No significant soft tissue abnormalities are seen.  IMPRESSION: Unremarkable noncontrast CT of the head.   Electronically Signed   By: Garald Balding M.D.   On: 02/08/2015 02:50   Ct Angio Chest Pe W/cm &/or Wo Cm  02/08/2015   CLINICAL DATA:  44 year old female with hypoxia and chest pain for several days. Initial encounter.  EXAM: CT ANGIOGRAPHY CHEST WITH CONTRAST  TECHNIQUE: Multidetector CT imaging of the chest was performed using the standard protocol during bolus administration of intravenous contrast. Multiplanar  CT image reconstructions and MIPs were obtained to evaluate the vascular anatomy.  CONTRAST:  177m OMNIPAQUE IOHEXOL 350 MG/ML SOLN  COMPARISON:  Chest radiographs 02/07/2015 and earlier.  FINDINGS: Adequate contrast bolus timing in the pulmonary arterial tree. Mild upper lobe respiratory motion artifact. No focal filling defect identified in the pulmonary arterial tree to suggest the presence of acute pulmonary embolism.  Major airways are patent. Low lung volumes with bilateral widespread pulmonary mosaic attenuation. Small calcified granuloma in the right lower lobe. No consolidation. No pleural effusion.  Cardiomegaly. No pericardial effusion. Increased bilateral hilar lymph node tissue, up to 8 mm in thickness. Similar mildly increased precarinal and right peritracheal nodal tissue, up to 10 mm in thickness. No calcified mediastinal nodes. Lymph nodes also appear increased at the thoracic inlet in number, and to a lesser extent size measuring up to 10 mm (left level 4 station). There is no axillary lymphadenopathy.  There is splenomegaly in the abdomen, with splenic volume greater than or equal to 719 mL (normal splenic volume range 83 - 412 mL). No discrete splenic lesion is identified. No upper abdominal lymphadenopathy. There is a partially visible 17 mm left adrenal nodule. Mild right adrenal thickening.  Surgically absent gallbladder. Negative visualized liver, pancreas, and bowel in the upper abdomen. Negative visualized aorta.  No osseous abnormality identified.  Review of the MIP images confirms the above findings.  IMPRESSION: 1.  No evidence of acute pulmonary embolus. 2. Increased number of 10 mm or smaller thoracic inlet, mediastinal, and hilar lymph nodes. Superimposed splenomegaly. No axillary or visualized upper abdominal lymphadenopathy. Still, this constellation is suspicious for a lymphoproliferative disorder, or less likely chronic inflammatory process such as sarcoidosis. 3. Low lung  volumes with pulmonary mosaic attenuation which might reflect pulmonary small airway or small vessel disease. 4. Cardiomegaly.  No pericardial or pleural effusion.   Electronically Signed   By: HGenevie AnnM.D.   On: 02/08/2015 10:27    Microbiology: No results found for this or any previous visit (from the past 240 hour(s)).   Labs: Basic Metabolic Panel:  Recent Labs Lab 02/07/15 1858 02/08/15 02/08/15 0926 02/09/15 0835  NA 144 139 139 139  K 4.1 4.3 3.1* 2.9*  CL 104 107 106 104  CO2 _0 GLUCOSE 101* 129* 115* 125*  BUN _1 CREATININE 1.17* 0.94 0.81 0.79  CALCIUM 9.7 8.9 9.1 9.2   Liver Function Tests:  Recent Labs Lab 02/08/15 0926  AST 16  ALT 14  ALKPHOS 69  BILITOT 0.7  PROT 6.7  ALBUMIN 3.3*   No results for input(s): LIPASE, AMYLASE in the last 168 hours. No results for input(s): AMMONIA in the last 168 hours. CBC:  Recent Labs Lab 02/08/15 02/08/15 0926  WBC 7.7 5.8  NEUTROABS 6.0 4.4  HGB 16.1* 14.7  HCT 51.4*  48.0*  MCV 85.5 86.3  PLT 164 123*   Cardiac Enzymes:  Recent Labs Lab 02/08/15  TROPONINI <0.03   BNP: BNP (last 3 results)  Recent Labs  02/08/15  BNP 276.3*    ProBNP (last 3 results) No results for input(s): PROBNP in the last 8760 hours.  CBG: No results for input(s): GLUCAP in the last 168 hours.     Signed:  Lauraine Crespo A  [facility] 02/09/2015, 12:38 PM

## 2015-02-09 NOTE — Care Management Note (Signed)
Case Management Note  Patient Details  Name: Pamela Ho MRN: [id number] Date of Birth: Mar 05, 1971                Action/Plan: Pt d/c and in need of sleep study per MD. CM did fax order form to [place] is without insurance. No further needs from CM at this time.   Bethena Roys, RN 02/09/2015, 2:54 PM

## 2015-04-28 ENCOUNTER — Ambulatory Visit (HOSPITAL_BASED_OUTPATIENT_CLINIC_OR_DEPARTMENT_OTHER): Payer: Self-pay

## 2015-07-14 ENCOUNTER — Ambulatory Visit (HOSPITAL_BASED_OUTPATIENT_CLINIC_OR_DEPARTMENT_OTHER): Payer: Medicaid Other | Attending: Internal Medicine

## 2016-11-12 ENCOUNTER — Other Ambulatory Visit: Payer: Self-pay | Admitting: Internal Medicine

## 2016-11-12 DIAGNOSIS — Z1231 Encounter for screening mammogram for malignant neoplasm of breast: Secondary | ICD-10-CM

## 2016-11-29 ENCOUNTER — Ambulatory Visit: Payer: Medicaid Other

## 2017-01-03 ENCOUNTER — Ambulatory Visit: Payer: Medicaid Other

## 2017-01-06 ENCOUNTER — Ambulatory Visit: Payer: Medicaid Other

## 2017-01-09 ENCOUNTER — Ambulatory Visit
Admission: RE | Admit: 2017-01-09 | Discharge: 2017-01-09 | Disposition: A | Discharge status: Home / Self Care | Payer: No Typology Code available for payment source | Source: Ambulatory Visit | Attending: Internal Medicine | Admitting: Internal Medicine

## 2017-01-09 DIAGNOSIS — Z1231 Encounter for screening mammogram for malignant neoplasm of breast: Secondary | ICD-10-CM

## 2019-04-08 ENCOUNTER — Other Ambulatory Visit: Payer: Self-pay

## 2019-04-08 DIAGNOSIS — Z20822 Contact with and (suspected) exposure to covid-19: Secondary | ICD-10-CM

## 2019-04-10 LAB — NOVEL CORONAVIRUS, NAA: SARS-CoV-2, NAA: NOT DETECTED

## 2021-08-05 ENCOUNTER — Emergency Department (HOSPITAL_COMMUNITY): Payer: Self-pay

## 2021-08-05 ENCOUNTER — Other Ambulatory Visit: Payer: Self-pay

## 2021-08-05 ENCOUNTER — Inpatient Hospital Stay (HOSPITAL_COMMUNITY)
Admission: EM | Admit: 2021-08-05 | Discharge: 2021-08-12 | DRG: 286 | Disposition: A | Discharge status: Home / Self Care | Payer: Self-pay | Attending: Internal Medicine | Admitting: Internal Medicine

## 2021-08-05 ENCOUNTER — Observation Stay (HOSPITAL_COMMUNITY): Payer: Self-pay

## 2021-08-05 ENCOUNTER — Encounter (HOSPITAL_COMMUNITY): Payer: Self-pay | Admitting: Emergency Medicine

## 2021-08-05 DIAGNOSIS — Z8249 Family history of ischemic heart disease and other diseases of the circulatory system: Secondary | ICD-10-CM

## 2021-08-05 DIAGNOSIS — E669 Obesity, unspecified: Secondary | ICD-10-CM | POA: Diagnosis present

## 2021-08-05 DIAGNOSIS — I5082 Biventricular heart failure: Secondary | ICD-10-CM | POA: Diagnosis present

## 2021-08-05 DIAGNOSIS — I501 Left ventricular failure: Secondary | ICD-10-CM | POA: Diagnosis present

## 2021-08-05 DIAGNOSIS — E8809 Other disorders of plasma-protein metabolism, not elsewhere classified: Secondary | ICD-10-CM | POA: Diagnosis present

## 2021-08-05 DIAGNOSIS — K7460 Unspecified cirrhosis of liver: Secondary | ICD-10-CM | POA: Diagnosis present

## 2021-08-05 DIAGNOSIS — I110 Hypertensive heart disease with heart failure: Principal | ICD-10-CM | POA: Diagnosis present

## 2021-08-05 DIAGNOSIS — E7439 Other disorders of intestinal carbohydrate absorption: Secondary | ICD-10-CM | POA: Diagnosis present

## 2021-08-05 DIAGNOSIS — Z888 Allergy status to other drugs, medicaments and biological substances status: Secondary | ICD-10-CM

## 2021-08-05 DIAGNOSIS — K760 Fatty (change of) liver, not elsewhere classified: Secondary | ICD-10-CM | POA: Diagnosis present

## 2021-08-05 DIAGNOSIS — K766 Portal hypertension: Secondary | ICD-10-CM | POA: Diagnosis present

## 2021-08-05 DIAGNOSIS — R188 Other ascites: Secondary | ICD-10-CM

## 2021-08-05 DIAGNOSIS — F411 Generalized anxiety disorder: Secondary | ICD-10-CM | POA: Diagnosis present

## 2021-08-05 DIAGNOSIS — R7989 Other specified abnormal findings of blood chemistry: Secondary | ICD-10-CM | POA: Diagnosis present

## 2021-08-05 DIAGNOSIS — I2721 Secondary pulmonary arterial hypertension: Secondary | ICD-10-CM | POA: Diagnosis present

## 2021-08-05 DIAGNOSIS — Z79899 Other long term (current) drug therapy: Secondary | ICD-10-CM

## 2021-08-05 DIAGNOSIS — I509 Heart failure, unspecified: Secondary | ICD-10-CM

## 2021-08-05 DIAGNOSIS — I5033 Acute on chronic diastolic (congestive) heart failure: Secondary | ICD-10-CM | POA: Diagnosis present

## 2021-08-05 DIAGNOSIS — R0600 Dyspnea, unspecified: Secondary | ICD-10-CM

## 2021-08-05 DIAGNOSIS — E876 Hypokalemia: Secondary | ICD-10-CM | POA: Diagnosis present

## 2021-08-05 DIAGNOSIS — E278 Other specified disorders of adrenal gland: Secondary | ICD-10-CM

## 2021-08-05 DIAGNOSIS — R0602 Shortness of breath: Secondary | ICD-10-CM

## 2021-08-05 DIAGNOSIS — Z6837 Body mass index (BMI) 37.0-37.9, adult: Secondary | ICD-10-CM

## 2021-08-05 DIAGNOSIS — J9811 Atelectasis: Secondary | ICD-10-CM | POA: Diagnosis present

## 2021-08-05 DIAGNOSIS — I10 Essential (primary) hypertension: Secondary | ICD-10-CM

## 2021-08-05 DIAGNOSIS — Z20822 Contact with and (suspected) exposure to covid-19: Secondary | ICD-10-CM | POA: Diagnosis present

## 2021-08-05 DIAGNOSIS — J9601 Acute respiratory failure with hypoxia: Secondary | ICD-10-CM | POA: Diagnosis present

## 2021-08-05 DIAGNOSIS — Z83438 Family history of other disorder of lipoprotein metabolism and other lipidemia: Secondary | ICD-10-CM

## 2021-08-05 DIAGNOSIS — R739 Hyperglycemia, unspecified: Secondary | ICD-10-CM | POA: Diagnosis present

## 2021-08-05 DIAGNOSIS — R778 Other specified abnormalities of plasma proteins: Secondary | ICD-10-CM

## 2021-08-05 DIAGNOSIS — J441 Chronic obstructive pulmonary disease with (acute) exacerbation: Secondary | ICD-10-CM | POA: Diagnosis not present

## 2021-08-05 DIAGNOSIS — I2723 Pulmonary hypertension due to lung diseases and hypoxia: Secondary | ICD-10-CM | POA: Diagnosis present

## 2021-08-05 DIAGNOSIS — J810 Acute pulmonary edema: Secondary | ICD-10-CM

## 2021-08-05 DIAGNOSIS — J90 Pleural effusion, not elsewhere classified: Secondary | ICD-10-CM

## 2021-08-05 DIAGNOSIS — I5032 Chronic diastolic (congestive) heart failure: Secondary | ICD-10-CM | POA: Insufficient documentation

## 2021-08-05 DIAGNOSIS — F17210 Nicotine dependence, cigarettes, uncomplicated: Secondary | ICD-10-CM | POA: Diagnosis present

## 2021-08-05 DIAGNOSIS — I071 Rheumatic tricuspid insufficiency: Secondary | ICD-10-CM | POA: Diagnosis present

## 2021-08-05 DIAGNOSIS — N179 Acute kidney failure, unspecified: Secondary | ICD-10-CM | POA: Diagnosis present

## 2021-08-05 DIAGNOSIS — E785 Hyperlipidemia, unspecified: Secondary | ICD-10-CM | POA: Diagnosis present

## 2021-08-05 DIAGNOSIS — K219 Gastro-esophageal reflux disease without esophagitis: Secondary | ICD-10-CM | POA: Diagnosis present

## 2021-08-05 DIAGNOSIS — F32A Depression, unspecified: Secondary | ICD-10-CM | POA: Diagnosis present

## 2021-08-05 DIAGNOSIS — D696 Thrombocytopenia, unspecified: Secondary | ICD-10-CM

## 2021-08-05 DIAGNOSIS — J918 Pleural effusion in other conditions classified elsewhere: Secondary | ICD-10-CM | POA: Diagnosis present

## 2021-08-05 HISTORY — DX: Heart failure, unspecified: I50.9

## 2021-08-05 LAB — CBC WITH DIFFERENTIAL/PLATELET
Abs Immature Granulocytes: 0.02 10*3/uL (ref 0.00–0.07)
Basophils Absolute: 0 10*3/uL (ref 0.0–0.1)
Basophils Relative: 0 %
Eosinophils Absolute: 0 10*3/uL (ref 0.0–0.5)
Eosinophils Relative: 0 %
HCT: 43.3 % (ref 36.0–46.0)
Hemoglobin: 11.5 g/dL — ABNORMAL LOW (ref 12.0–15.0)
Immature Granulocytes: 1 %
Lymphocytes Relative: 20 %
Lymphs Abs: 0.9 10*3/uL (ref 0.7–4.0)
MCH: 21.1 pg — ABNORMAL LOW (ref 26.0–34.0)
MCHC: 26.6 g/dL — ABNORMAL LOW (ref 30.0–36.0)
MCV: 79.3 fL — ABNORMAL LOW (ref 80.0–100.0)
Monocytes Absolute: 0.3 10*3/uL (ref 0.1–1.0)
Monocytes Relative: 7 %
Neutro Abs: 3.2 10*3/uL (ref 1.7–7.7)
Neutrophils Relative %: 72 %
Platelets: 110 10*3/uL — ABNORMAL LOW (ref 150–400)
RBC: 5.46 MIL/uL — ABNORMAL HIGH (ref 3.87–5.11)
RDW: 22.8 % — ABNORMAL HIGH (ref 11.5–15.5)
Smear Review: DECREASED
WBC: 4.4 10*3/uL (ref 4.0–10.5)
nRBC: 0.7 % — ABNORMAL HIGH (ref 0.0–0.2)

## 2021-08-05 LAB — COMPREHENSIVE METABOLIC PANEL
ALT: 33 U/L (ref 0–44)
AST: 23 U/L (ref 15–41)
Albumin: 2.7 g/dL — ABNORMAL LOW (ref 3.5–5.0)
Alkaline Phosphatase: 86 U/L (ref 38–126)
Anion gap: 12 (ref 5–15)
BUN: 36 mg/dL — ABNORMAL HIGH (ref 6–20)
CO2: 31 mmol/L (ref 22–32)
Calcium: 9.5 mg/dL (ref 8.9–10.3)
Chloride: 91 mmol/L — ABNORMAL LOW (ref 98–111)
Creatinine, Ser: 1.33 mg/dL — ABNORMAL HIGH (ref 0.44–1.00)
GFR, Estimated: 48 mL/min — ABNORMAL LOW (ref >60)
Glucose, Bld: 193 mg/dL — ABNORMAL HIGH (ref 70–99)
Potassium: 2.8 mmol/L — ABNORMAL LOW (ref 3.5–5.1)
Sodium: 134 mmol/L — ABNORMAL LOW (ref 135–145)
Total Bilirubin: 3 mg/dL — ABNORMAL HIGH (ref 0.3–1.2)
Total Protein: 6.2 g/dL — ABNORMAL LOW (ref 6.5–8.1)

## 2021-08-05 LAB — TROPONIN I (HIGH SENSITIVITY)
Troponin I (High Sensitivity): 41 ng/L — ABNORMAL HIGH (ref <18)
Troponin I (High Sensitivity): 39 ng/L — ABNORMAL HIGH (ref <18)

## 2021-08-05 LAB — I-STAT VENOUS BLOOD GAS, ED
Acid-Base Excess: 9 mmol/L — ABNORMAL HIGH (ref 0.0–2.0)
Bicarbonate: 35.8 mmol/L — ABNORMAL HIGH (ref 20.0–28.0)
Calcium, Ion: 1.18 mmol/L (ref 1.15–1.40)
HCT: 42 % (ref 36.0–46.0)
Hemoglobin: 14.3 g/dL (ref 12.0–15.0)
O2 Saturation: 74 %
Potassium: 2.8 mmol/L — ABNORMAL LOW (ref 3.5–5.1)
Sodium: 136 mmol/L (ref 135–145)
TCO2: 37 mmol/L — ABNORMAL HIGH (ref 22–32)
pCO2, Ven: 54.8 mmHg (ref 44.0–60.0)
pH, Ven: 7.423 (ref 7.250–7.430)
pO2, Ven: 40 mmHg (ref 32.0–45.0)

## 2021-08-05 LAB — BRAIN NATRIURETIC PEPTIDE: B Natriuretic Peptide: 550.9 pg/mL — ABNORMAL HIGH (ref 0.0–100.0)

## 2021-08-05 LAB — RESP PANEL BY RT-PCR (FLU A&B, COVID) ARPGX2
Influenza A by PCR: NEGATIVE
Influenza B by PCR: NEGATIVE
SARS Coronavirus 2 by RT PCR: NEGATIVE

## 2021-08-05 MED ORDER — CLONAZEPAM 0.5 MG PO TABS
0.5000 mg | ORAL_TABLET | Freq: Two times a day (BID) | ORAL | Status: DC | PRN
  Administered 2021-08-06 – 2021-08-12 (×12): 0.5 mg via ORAL
  Filled 2021-08-05 (×13): qty 1

## 2021-08-05 MED ORDER — SERTRALINE HCL 50 MG PO TABS
150.0000 mg | ORAL_TABLET | Freq: Every day | ORAL | Status: DC
  Administered 2021-08-06 – 2021-08-12 (×7): 150 mg via ORAL
  Filled 2021-08-05 (×7): qty 3

## 2021-08-05 MED ORDER — PANTOPRAZOLE SODIUM 40 MG PO TBEC
40.0000 mg | DELAYED_RELEASE_TABLET | Freq: Every day | ORAL | Status: DC
  Administered 2021-08-06 – 2021-08-12 (×7): 40 mg via ORAL
  Filled 2021-08-05 (×7): qty 1

## 2021-08-05 MED ORDER — POTASSIUM CHLORIDE 10 MEQ/100ML IV SOLN
10.0000 meq | INTRAVENOUS | Status: AC
  Administered 2021-08-05 – 2021-08-06 (×2): 10 meq via INTRAVENOUS
  Filled 2021-08-05 (×2): qty 100

## 2021-08-05 MED ORDER — ONDANSETRON HCL 4 MG PO TABS: 4.0000 mg | ORAL_TABLET | Freq: Four times a day (QID) | ORAL | Status: DC | PRN

## 2021-08-05 MED ORDER — ENOXAPARIN SODIUM 40 MG/0.4ML IJ SOSY
40.0000 mg | PREFILLED_SYRINGE | INTRAMUSCULAR | Status: DC
  Administered 2021-08-06 – 2021-08-12 (×7): 40 mg via SUBCUTANEOUS
  Filled 2021-08-05 (×7): qty 0.4

## 2021-08-05 MED ORDER — ACETAMINOPHEN 650 MG RE SUPP: 650.0000 mg | Freq: Four times a day (QID) | RECTAL | Status: DC | PRN

## 2021-08-05 MED ORDER — ACETAMINOPHEN 325 MG PO TABS
650.0000 mg | ORAL_TABLET | Freq: Four times a day (QID) | ORAL | Status: DC | PRN
  Administered 2021-08-10 – 2021-08-11 (×2): 650 mg via ORAL
  Filled 2021-08-05 (×2): qty 2

## 2021-08-05 MED ORDER — FUROSEMIDE 10 MG/ML IJ SOLN
40.0000 mg | Freq: Two times a day (BID) | INTRAMUSCULAR | Status: DC
  Administered 2021-08-06 (×2): 40 mg via INTRAVENOUS
  Filled 2021-08-05 (×2): qty 4

## 2021-08-05 MED ORDER — PRAVASTATIN SODIUM 10 MG PO TABS
20.0000 mg | ORAL_TABLET | Freq: Every day | ORAL | Status: DC
  Administered 2021-08-06 – 2021-08-12 (×7): 20 mg via ORAL
  Filled 2021-08-05 (×7): qty 2

## 2021-08-05 MED ORDER — ONDANSETRON HCL 4 MG/2ML IJ SOLN: 4.0000 mg | Freq: Four times a day (QID) | INTRAMUSCULAR | Status: DC | PRN

## 2021-08-05 MED ORDER — METOPROLOL SUCCINATE ER 50 MG PO TB24
50.0000 mg | ORAL_TABLET | Freq: Every day | ORAL | Status: DC
  Administered 2021-08-06 – 2021-08-12 (×7): 50 mg via ORAL
  Filled 2021-08-05 (×4): qty 1
  Filled 2021-08-05: qty 2
  Filled 2021-08-05 (×2): qty 1

## 2021-08-05 MED ORDER — FUROSEMIDE 10 MG/ML IJ SOLN
60.0000 mg | Freq: Once | INTRAMUSCULAR | Status: AC
  Administered 2021-08-05: 60 mg via INTRAVENOUS
  Filled 2021-08-05: qty 6

## 2021-08-05 MED ORDER — GUAIFENESIN ER 600 MG PO TB12
600.0000 mg | ORAL_TABLET | Freq: Two times a day (BID) | ORAL | Status: DC | PRN
  Administered 2021-08-07 – 2021-08-12 (×7): 600 mg via ORAL
  Filled 2021-08-05 (×7): qty 1

## 2021-08-05 MED ORDER — POLYETHYLENE GLYCOL 3350 17 G PO PACK: 17.0000 g | PACK | Freq: Every day | ORAL | Status: DC | PRN

## 2021-08-05 NOTE — ED Provider Notes (Signed)
[facility] EMERGENCY DEPARTMENT Provider Note   CSN: [id number] Arrival date & time: 08/05/21  2022     History  Chief Complaint  Patient presents with   SOB / COPD    Pamela Ho is a 51 y.o. female.  Patient is a 51 year old female with a history of COPD, GERD, hypertension, anxiety and depression who is presenting today with complaint of shortness of breath.  Patient reports for months now she has noticed gradually worsening shortness of breath, cough that is intermittently productive of yellow sputum but reports over the last few days shortness of breath had gotten worse and tonight it became severe.  She reports it feels like her stomach is full and she cannot get a deep breath.  She does not feel like the nebulizer and the Solu-Medrol that was given by EMS has helped her breathing.  She denies any fever, chest pain.  She has not had nausea or vomiting.  She does continue to use tobacco products but denies any drug use or alcohol use.  No history of heart disease or liver issues.  She reports she has not seen a doctor since she started having the symptoms in November because she was scared of what they may find.  The history is provided by the patient and the EMS personnel.      Home Medications Prior to Admission medications   Medication Sig Start Date End Date Taking? Authorizing Provider  acetaminophen (TYLENOL) 500 MG tablet Take 500 mg by mouth every 6 (six) hours as needed for mild pain.   Yes [provider]  chlorthalidone (HYGROTON) 25 MG tablet Take 25 mg by mouth daily. 07/22/21  Yes [provider]  cholecalciferol (VITAMIN D3) 25 MCG (1000 UNIT) tablet Take 1,000 Units by mouth daily.   Yes [provider]  clonazePAM (KLONOPIN) 0.5 MG tablet Take 0.5 mg by mouth 2 (two) times daily as needed for anxiety.   Yes [provider]  guaiFENesin (MUCINEX) 600 MG 12 hr tablet Take 600 mg by mouth 2 (two) times  daily as needed for cough.   Yes [provider]  LEXAPRO 10 MG tablet Take 20 mg by mouth daily. 07/16/21  Yes [provider]  metoprolol succinate (TOPROL-XL) 50 MG 24 hr tablet Take 50 mg by mouth daily. 07/20/21  Yes [provider]  omeprazole (PRILOSEC) 20 MG capsule Take 20 mg by mouth daily.   Yes [provider]  pravastatin (PRAVACHOL) 20 MG tablet Take 20 mg by mouth daily.   Yes [provider]  sertraline (ZOLOFT) 100 MG tablet Take 150 mg by mouth daily.   Yes [provider]  spironolactone (ALDACTONE) 25 MG tablet Take 1 tablet (25 mg total) by mouth daily. 02/09/15  Yes Verlee Monte, MD  IRON PO Take 1 tablet by mouth daily.  Patient not taking: Reported on 08/05/2021    [provider]  lisinopril (PRINIVIL,ZESTRIL) 10 MG tablet Take 1 tablet (10 mg total) by mouth daily. Patient not taking: Reported on 08/05/2021 02/09/15   Verlee Monte, MD  metoprolol (LOPRESSOR) 50 MG tablet Take 1 tablet (50 mg total) by mouth 2 (two) times daily. Patient not taking: Reported on 08/05/2021 02/09/15   Verlee Monte, MD      Allergies    Lexapro [escitalopram] and Lisinopril    Review of Systems   Review of Systems  Physical Exam Updated Vital Signs BP 100/76    Pulse 91  Temp 97.7 F (36.5 C) (Oral)    Resp (!) 26    Ht _0  (1.575 m)    Wt 86.2 kg    SpO2 93%    BMI 34.75 kg/m  Physical Exam Vitals and nursing note reviewed.  Constitutional:      General: She is in acute distress.     Appearance: She is well-developed. She is ill-appearing.  HENT:     Head: Normocephalic and atraumatic.     Mouth/Throat:     Mouth: Mucous membranes are dry.  Eyes:     General: No scleral icterus.    Pupils: Pupils are equal, round, and reactive to light.  Cardiovascular:     Rate and Rhythm: Normal rate and regular rhythm.     Heart sounds: Normal heart sounds. No murmur heard.   No friction rub.  Pulmonary:     Effort:  Pulmonary effort is normal. Tachypnea present.     Breath sounds: Normal breath sounds. Decreased air movement present. No wheezing or rales.     Comments: Coarse wet sounding cough Abdominal:     General: Bowel sounds are normal. There is distension.     Palpations: Abdomen is soft.     Tenderness: There is no abdominal tenderness. There is no guarding or rebound.  Musculoskeletal:        General: No tenderness. Normal range of motion.     Right lower leg: Edema present.     Left lower leg: Edema present.     Comments: Trace edema in bilateral lower ext.    Skin:    General: Skin is warm and dry.     Findings: No rash.     Comments: Pale and slightly yellow skin  Neurological:     Mental Status: She is alert and oriented to person, place, and time. Mental status is at baseline.     Cranial Nerves: No cranial nerve deficit.  Psychiatric:        Mood and Affect: Mood normal.        Behavior: Behavior normal.    ED Results / Procedures / Treatments   Labs (all labs ordered are listed, but only abnormal results are displayed) Labs Reviewed  CBC WITH DIFFERENTIAL/PLATELET - Abnormal; Notable for the following components:      Result Value   RBC 5.46 (*)    Hemoglobin 11.5 (*)    MCV 79.3 (*)    MCH 21.1 (*)    MCHC 26.6 (*)    RDW 22.8 (*)    Platelets 110 (*)    nRBC 0.7 (*)    All other components within normal limits  COMPREHENSIVE METABOLIC PANEL - Abnormal; Notable for the following components:   Sodium 134 (*)    Potassium 2.8 (*)    Chloride 91 (*)    Glucose, Bld 193 (*)    BUN 36 (*)    Creatinine, Ser 1.33 (*)    Total Protein 6.2 (*)    Albumin 2.7 (*)    Total Bilirubin 3.0 (*)    GFR, Estimated 48 (*)    All other components within normal limits  BRAIN NATRIURETIC PEPTIDE - Abnormal; Notable for the following components:   B Natriuretic Peptide 550.9 (*)    All other components within normal limits  I-STAT VENOUS BLOOD GAS, ED - Abnormal; Notable for the  following components:   Bicarbonate 35.8 (*)    TCO2 37 (*)    Acid-Base Excess 9.0 (*)  Potassium 2.8 (*)    All other components within normal limits  TROPONIN I (HIGH SENSITIVITY) - Abnormal; Notable for the following components:   Troponin I (High Sensitivity) 41 (*)    All other components within normal limits  RESP PANEL BY RT-PCR (FLU A&B, COVID) [id number]    EKG EKG Interpretation  Date/Time:  Sunday August 05 2021 20:27:19 EST Ventricular Rate:  83 PR Interval:  146 QRS Duration: 100 QT Interval:  335 QTC Calculation: 394 R Axis:   90 Text Interpretation: Sinus rhythm Atrial premature complex Borderline right axis deviation Nonspecific repol abnormality, diffuse leads No significant change since last tracing Confirmed by Blanchie Dessert [id number]) on 08/05/2021 8:49:00 PM  Radiology DG Chest Portable 1 View  Result Date: 08/05/2021 CLINICAL DATA:  Shortness of breath EXAM: PORTABLE CHEST 1 VIEW COMPARISON:  02/07/2015 FINDINGS: Mild cardiomegaly. Interstitial prominence may reflect early interstitial edema. No effusions or acute bony abnormality. IMPRESSION: Mild cardiomegaly and interstitial prominence which may reflect interstitial edema. Electronically Signed   By: Rolm Baptise M.D.   On: 08/05/2021 20:55    Procedures Procedures    Medications Ordered in ED Medications  potassium chloride 10 mEq in 100 mL IVPB (has no administration in time range)  furosemide (LASIX) injection 60 mg (has no administration in time range)    ED Course/ Medical Decision Making/ A&P                           Medical Decision Making Amount and/or Complexity of Data Reviewed Independent Historian: EMS External Data Reviewed: labs and notes. Labs: ordered. Decision-making details documented in ED Course. Radiology: ordered and independent interpretation performed. Decision-making details documented in ED Course. ECG/medicine tests: ordered and independent interpretation performed.  Decision-making details documented in ED Course.  Risk Prescription drug management. Decision regarding hospitalization.   Patient is a 51 year old female presenting today with complaints of shortness of breath.  She has a wet congested sounding cough and per EMS she was wheezing prior to arrival.  Outside information was obtained from patient's external medical records as well as EMS.  Sats were normal upon their arrival but she did receive Solu-Medrol 125 and 2 DuoNebs.  Patient is currently satting at 100% on a neb mask.  She reports she does not feel like the symptoms improved with the breathing treatments.  She does have a history of COPD and concern for possible COPD exacerbation versus pneumonia but also concerned of ACS and CHF.  She denies any prior history of liver disease.  She has no prior history of blood clots and does not take any anticoagulation.  We will continue patient on nasal cannula oxygen due to her work of breathing.  Chest x-ray and labs are pending.  I independently evaluated patient's EKG that shows no acute findings at this time.  Lower suspicion for ACS, pericarditis, myocarditis.  10:16 PM I independently interpreted patient's chest x-ray and appears to have pulmonary edema.  Radiology reported mild cardiomegaly with evidence of interstitial edema.  I independently interpreted patient's labs and VBG without evidence of acidosis or hypercarbia.  COVID is negative, elevated BNP today of 550 from 200 in the past and mild elevation of troponin at 41, CMP with hypokalemia of 2.8 and creatinine of 1.33 with normal LFTs, CBC with normal white count and hemoglobin of 11.5.  On repeat evaluation patient is on nasal cannula oxygen sitting upright and reports feeling okay.  Currently sats are in  the low 90s and respiratory rate is in the mid 20s.  Suspect CHF exacerbation.  Lower suspicion for COPD at this time as patient is not wheezing and x-ray favors fluid overload.  Bedside echo  shows no evidence of large pericardial effusion.  Patient given IV potassium and started on IV Lasix for diuresis.  Patient does meet admission criteria spoke with the hospitalist for admission.  Discussed the findings with the patient and her husband.  They are comfortable with this plan.  CRITICAL CARE Performed by: Shineka Auble Total critical care time: 30 minutes Critical care time was exclusive of separately billable procedures and treating other patients. Critical care was necessary to treat or prevent imminent or life-threatening deterioration. Critical care was time spent personally by me on the following activities: development of treatment plan with patient and/or surrogate as well as nursing, discussions with consultants, evaluation of patient's response to treatment, examination of patient, obtaining history from patient or surrogate, ordering and performing treatments and interventions, ordering and review of laboratory studies, ordering and review of radiographic studies, pulse oximetry and re-evaluation of patient's condition.     EMERGENCY DEPARTMENT [place] CARDIAC EXAM "Study: Limited Ultrasound of the Heart and Pericardium"  INDICATIONS:Dyspnea Multiple views of the heart and pericardium were obtained in real-time with a multi-frequency probe.  PERFORMED RA:FOADLK IMAGES ARCHIVED?: No LIMITATIONS:  None VIEWS USED: Parasternal long axis INTERPRETATION: Cardiac activity present, Pericardial effusioin absent, and Cardiac tamponade absent         Final Clinical Impression(s) / ED Diagnoses Final diagnoses:  Acute congestive heart failure, unspecified heart failure type [facility])    Rx / DC Orders ED Discharge Orders     None         Blanchie Dessert, MD 08/05/21 2220

## 2021-08-05 NOTE — ED Triage Notes (Signed)
Patient arrived with EMS from home reports worsening SOB with chest congestion and productive cough/wheezing . No fever or chills. She received Solumedrol 125 mg IV and 2 doses of Duoneb nebulizer prior to arrival .

## 2021-08-05 NOTE — H&P (Signed)
History and Physical    Patient: Pamela Ho MRN: [id number] DOA: 08/05/2021  Date of Service: the patient was seen and examined on 08/06/2021  Patient coming from: Home via EMS  Chief Complaint:  Chief Complaint  Patient presents with   SOB / COPD    HPI:   51 year old female with past medical history of hypertension, COPD, obesity, dependence, nicotine dependence who presents to [facility] emergency room via EMS with complaints of shortness of breath.  Patient reports that for the past several months she has been experiencing increasing dyspnea with exertion.  Symptoms were initially mild but gradually worsened over the next several months.  In the past week in particular, patient's shortness breath has become particularly severe and has now become associated with cough intermittently productive of white to yellow sputum.  Patient notes that over the same span of time she has developed pillow orthopnea, bilateral lower extremity edema and increasing abdominal girth.  Patient denies any associated chest pain, fevers, sick contacts, recent travel or contact with COVID-19 infection.  Patient's symptoms continued to worsen until EMS was contacted who promptly came to evaluate the patient, noting that she was in mild respiratory distress.  EMS notes the patient was exhibiting diffuse wheezing and regularly is administered bronchodilator therapy as well as a dose of Solu-Medrol.  Patient was promptly brought into [facility] emergency department for evaluation.  Upon evaluation in the emergency department ER provider clinically felt patient was suffering from acute congestive heart failure.  BNP was found to be somewhat elevated at 550.  Chest x-ray revealed mild cardiogenic pulmonary edema.  Bedside echocardiography performed by the emergency room provider revealed no evidence of pericardial effusion.  ER provider ordered 60 mg of intravenous Lasix and the hospitalist group was  then called to assess the patient for admission of the hospital.    Review of Systems: Review of Systems  Respiratory:  Positive for cough, shortness of breath and wheezing.   Cardiovascular:  Positive for orthopnea and leg swelling.  All other systems reviewed and are negative.   Past Medical History:  Diagnosis Date   Acute congestive heart failure ([place]) 08/05/2021   Anemia    Anxiety    COPD (chronic obstructive pulmonary disease) (HCC)    Depression    GERD (gastroesophageal reflux disease)    Heart murmur    Hypertension     Past Surgical History:  Procedure Laterality Date   CHOLECYSTECTOMY     cyst from neck      Social History:  reports that she has been smoking cigarettes. She has a 45.00 pack-year smoking history. She has never used smokeless tobacco. She reports that she does not drink alcohol and does not use drugs.  Allergies  Allergen Reactions   Lexapro [Escitalopram]     "Made me crazy"    Lisinopril     "Angioedema"    Family History  Problem Relation Age of Onset   Diabetes Mother    Heart disease Mother    Hyperlipidemia Mother    Diabetes Father    Cancer Father    Heart disease Father    Hyperlipidemia Father    Hypertension Father    Heart disease Maternal Grandmother    Hyperlipidemia Maternal Grandmother    Hypertension Maternal Grandmother    Heart disease Maternal Grandfather    Hyperlipidemia Maternal Grandfather    Hypertension Maternal Grandfather    Heart disease Paternal Grandmother    Hyperlipidemia Paternal Grandmother  Hypertension Paternal Grandmother    Stroke Paternal Grandmother    Diabetes Paternal Grandmother    Cancer Paternal Grandmother    Breast cancer Neg Hx     Prior to Admission medications   Medication Sig Start Date End Date Taking? Authorizing Provider  acetaminophen (TYLENOL) 500 MG tablet Take 500 mg by mouth every 6 (six) hours as needed for mild pain.   Yes [provider]  chlorthalidone  (HYGROTON) 25 MG tablet Take 25 mg by mouth daily. 07/22/21  Yes [provider]  cholecalciferol (VITAMIN D3) 25 MCG (1000 UNIT) tablet Take 1,000 Units by mouth daily.   Yes [provider]  clonazePAM (KLONOPIN) 0.5 MG tablet Take 0.5 mg by mouth 2 (two) times daily as needed for anxiety.   Yes [provider]  guaiFENesin (MUCINEX) 600 MG 12 hr tablet Take 600 mg by mouth 2 (two) times daily as needed for cough.   Yes [provider]  LEXAPRO 10 MG tablet Take 20 mg by mouth daily. 07/16/21  Yes [provider]  metoprolol succinate (TOPROL-XL) 50 MG 24 hr tablet Take 50 mg by mouth daily. 07/20/21  Yes [provider]  omeprazole (PRILOSEC) 20 MG capsule Take 20 mg by mouth daily.   Yes [provider]  pravastatin (PRAVACHOL) 20 MG tablet Take 20 mg by mouth daily.   Yes [provider]  sertraline (ZOLOFT) 100 MG tablet Take 150 mg by mouth daily.   Yes [provider]  spironolactone (ALDACTONE) 25 MG tablet Take 1 tablet (25 mg total) by mouth daily. 02/09/15  Yes Verlee Monte, MD  IRON PO Take 1 tablet by mouth daily.  Patient not taking: Reported on 08/05/2021    [provider]  lisinopril (PRINIVIL,ZESTRIL) 10 MG tablet Take 1 tablet (10 mg total) by mouth daily. Patient not taking: Reported on 08/05/2021 02/09/15   Verlee Monte, MD  metoprolol (LOPRESSOR) 50 MG tablet Take 1 tablet (50 mg total) by mouth 2 (two) times daily. Patient not taking: Reported on 08/05/2021 02/09/15   Verlee Monte, MD    Physical Exam:  Vitals:   08/05/21 2115 08/05/21 2145 08/05/21 2215 08/05/21 2230  BP: 100/76 (!) 104/53 (!) 107/54 101/62  Pulse: 91 87 86 85  Resp: (!) 26 (!) _0 Temp:      TempSrc:      SpO2: 93% 94% 92% 92%  Weight:      Height:        Constitutional: Awake alert and oriented x3, no associated distress.   Skin: Mild jaundice noted.  Sparse petechia noted over the extremities.  Poor  skin turgor noted.   Eyes: Pupils are equally reactive to light.  Minimal scleral icterus noted with some conjunctival pallor.   ENMT: Moist mucous membranes noted.  Posterior pharynx clear of any exudate or lesions.   Neck: normal, supple, no masses, no thyromegaly.  No evidence of jugular venous distension.   Respiratory: Bibasilar rales noted with scattered rhonchi bilaterally.  No evidence of wheezing.  Normal respiratory effort. No accessory muscle use.  Cardiovascular: Regular rate and rhythm, no murmurs / rubs / gallops.  Severe bilateral lower extremity pitting edema that tracks in the feet all the way up through the thighs.. 2+ pedal pulses. No carotid bruits.  Chest:   Nontender without crepitus or deformity.   Back:   Nontender without crepitus or deformity. Abdomen: Markedly protuberant abdomen that is soft and nontender.    No evidence  of intra-abdominal masses.  Positive bowel sounds noted in all quadrants.   Musculoskeletal: No joint deformity upper and lower extremities. Good ROM, no contractures. Normal muscle tone.  Neurologic: CN 2-12 grossly intact. Sensation intact.  Patient moving all 4 extremities spontaneously.  Patient is following all commands.  Patient is responsive to verbal stimuli.   Psychiatric: Patient exhibits normal mood with flat affect.  Patient seems to possess insight as to their current situation.    Data Reviewed:  I have personally reviewed and interpreted labs, imaging.  Significant findings are:  Platelet count 110.   Hemoglobin 11.5.   Potassium 2.8.   Troponins noted to be slightly elevated at 39 and 41.  Chest x-ray personally reviewed revealing mild cardiomegaly with bilateral patchy lower lobe infiltrates concerning for pulmonary edema.   Right upper quadrant ultrasound revealing surface nodularity consistent with cirrhosis with minimal  perihepatic ascites.  EKG: Personally reviewed.  Rhythm is normal sinus rhythm with heart rate of 83 bpm.   No dynamic ST segment changes appreciated.    Assessment and Plan: * Acute congestive heart failure (HCC) Patient presenting with several month history of progressively worsening dyspnea on exertion, peripheral edema and cough with increasing abdominal girth BNP found to be markedly elevated at 550 with chest x-ray identifying cardiomegaly with pulmonary edema all concerning for cardiogenic volume overload I believe patient is primarily suffering from acute congestive heart failure however there likely is a component of some degree of volume overload due to previously undiagnosed cirrhosis Patient has been given a dose of 60 mg of intravenous Lasix by the emergency department provider, will continue Lasix 40 mg IV twice daily and adjust accordingly based on clinical response Resume home regimen of spironolactone as well considering hypokalemia and liver disease. Echocardiogram ordered for the morning Strict input and output monitoring Supplemental oxygen for bouts of hypoxia Monitoring renal function and electrolytes with serial chemistires Daily weights   Acute pulmonary edema (HCC)- (present on admission) Pulmonary edema seen on chest x-ray felt to primarily be cardiogenic in nature due to congestive heart failure Providing patient with diuretics, with as needed antitussives and supplemental oxygen necessary Remainder of assessment and plan as above  Cirrhosis of liver ([place])- (present on admission) Patient exhibiting mild jaundice, petechia and increased abdominal girth on exam Evaluation of labs reveals hypoalbuminemia, mild hyperbilirubinemia and what seems to be thrombocytopenia since at least 2016 I have obtained a right upper quadrant ultrasound that reveals evidence of new diagnosis of cirrhosis of undetermined etiology Obtaining PT, PTT, hepatitis panel, acetaminophen level, antimicrosomal antibody, ANA, anti-smooth muscle antibody Obtaining echocardiogram  Obtaining ascites  survey considering protuberant abdomen Based on results of initial work-up and clinical course consideration can be made for inpatient GI evaluation versus referral for outpatient follow-up  Elevated troponin level not due myocardial infarction- (present on admission) Slightly elevated serial troponins with flat trajectory of elevation Patient is chest pain-free Likely secondary to underlying illness, plaque rupture is unlikely Monitoring patient on telemetry   Essential hypertension- (present on admission) Resume patients home regimen of oral antihypertensives Titrate antihypertensive regimen as necessary to achieve adequate BP control PRN intravenous antihypertensives for excessively elevated blood pressure    Nicotine dependence, cigarettes, uncomplicated- (present on admission) Counseling patient on smoking cessation daily  Generalized anxiety disorder- (present on admission) Continuing home regimen of as needed clonazepam Continue home regimen of Zoloft  GERD without esophagitis- (present on admission) Continuing home regimen of daily PPI therapy.   Hypokalemia- (present on admission)  Replacing with potassium chloride Resuming home regimen of spironolactone Evaluating for concurrent hypomagnesemia  Monitoring potassium levels with serial chemistries.        Code Status:  Full code  code status decision has been confirmed with: patient Family Communication: Husband is at bedside and has been updated on plan of care.  Consults: None  Severity of Illness:  The appropriate patient status for this p we will resume home regimen of spironolactone as well considering hypokalemia atient is OBSERVATION. Observation status is judged to be reasonable and necessary in order to provide the required intensity of service to ensure the patient's safety. The patient's presenting symptoms, physical exam findings, and initial radiographic and laboratory data in the context of their  medical condition is felt to place them at decreased risk for further clinical deterioration. Furthermore, it is anticipated that the patient will be medically stable for discharge from the hospital within 2 midnights of admission.   Author:  Vernelle Emerald MD  08/06/2021 3:36 AM

## 2021-08-06 ENCOUNTER — Observation Stay (HOSPITAL_COMMUNITY): Payer: No Typology Code available for payment source

## 2021-08-06 ENCOUNTER — Observation Stay (HOSPITAL_COMMUNITY): Payer: Self-pay

## 2021-08-06 DIAGNOSIS — F411 Generalized anxiety disorder: Secondary | ICD-10-CM | POA: Diagnosis present

## 2021-08-06 DIAGNOSIS — E876 Hypokalemia: Secondary | ICD-10-CM | POA: Diagnosis present

## 2021-08-06 DIAGNOSIS — I5033 Acute on chronic diastolic (congestive) heart failure: Secondary | ICD-10-CM

## 2021-08-06 DIAGNOSIS — I5032 Chronic diastolic (congestive) heart failure: Secondary | ICD-10-CM | POA: Insufficient documentation

## 2021-08-06 DIAGNOSIS — I509 Heart failure, unspecified: Secondary | ICD-10-CM

## 2021-08-06 DIAGNOSIS — K219 Gastro-esophageal reflux disease without esophagitis: Secondary | ICD-10-CM | POA: Diagnosis present

## 2021-08-06 DIAGNOSIS — K7460 Unspecified cirrhosis of liver: Secondary | ICD-10-CM | POA: Diagnosis present

## 2021-08-06 LAB — ECHOCARDIOGRAM COMPLETE
AR max vel: 2.31 cm2
AV Peak grad: 8.2 mmHg
Ao pk vel: 1.43 m/s
Area-P 1/2: 4.15 cm2
Calc EF: 62.9 %
Height: 62 in
S' Lateral: 2.7 cm
Single Plane A2C EF: 62.1 %
Single Plane A4C EF: 63.8 %
Weight: 3040 oz

## 2021-08-06 LAB — COMPREHENSIVE METABOLIC PANEL
ALT: 29 U/L (ref 0–44)
AST: 21 U/L (ref 15–41)
Albumin: 2.5 g/dL — ABNORMAL LOW (ref 3.5–5.0)
Alkaline Phosphatase: 77 U/L (ref 38–126)
Anion gap: 11 (ref 5–15)
BUN: 35 mg/dL — ABNORMAL HIGH (ref 6–20)
CO2: 32 mmol/L (ref 22–32)
Calcium: 9.4 mg/dL (ref 8.9–10.3)
Chloride: 91 mmol/L — ABNORMAL LOW (ref 98–111)
Creatinine, Ser: 1.31 mg/dL — ABNORMAL HIGH (ref 0.44–1.00)
GFR, Estimated: 49 mL/min — ABNORMAL LOW (ref >60)
Glucose, Bld: 346 mg/dL — ABNORMAL HIGH (ref 70–99)
Potassium: 3 mmol/L — ABNORMAL LOW (ref 3.5–5.1)
Sodium: 134 mmol/L — ABNORMAL LOW (ref 135–145)
Total Bilirubin: 2.2 mg/dL — ABNORMAL HIGH (ref 0.3–1.2)
Total Protein: 5.8 g/dL — ABNORMAL LOW (ref 6.5–8.1)

## 2021-08-06 LAB — HEPATITIS PANEL, ACUTE
HCV Ab: NONREACTIVE
Hep A IgM: NONREACTIVE
Hep B C IgM: NONREACTIVE
Hepatitis B Surface Ag: NONREACTIVE

## 2021-08-06 LAB — PROTIME-INR

## 2021-08-06 LAB — HEMOGLOBIN A1C
Hgb A1c MFr Bld: 5.5 % (ref 4.8–5.6)
Mean Plasma Glucose: 111.15 mg/dL

## 2021-08-06 LAB — APTT

## 2021-08-06 LAB — ACETAMINOPHEN LEVEL: Acetaminophen (Tylenol), Serum: <10 ug/mL — ABNORMAL LOW (ref 10–30)

## 2021-08-06 LAB — AMMONIA: Ammonia: 51 umol/L — ABNORMAL HIGH (ref 9–35)

## 2021-08-06 LAB — GLUCOSE, CAPILLARY
Glucose-Capillary: 201 mg/dL — ABNORMAL HIGH (ref 70–99)
Glucose-Capillary: 184 mg/dL — ABNORMAL HIGH (ref 70–99)

## 2021-08-06 LAB — MAGNESIUM: Magnesium: 1.8 mg/dL (ref 1.7–2.4)

## 2021-08-06 MED ORDER — INSULIN ASPART 100 UNIT/ML IJ SOLN
0.0000 [IU] | Freq: Every day | INTRAMUSCULAR | Status: DC
  Administered 2021-08-11: 2 [IU] via SUBCUTANEOUS

## 2021-08-06 MED ORDER — ALBUTEROL SULFATE (2.5 MG/3ML) 0.083% IN NEBU
2.5000 mg | INHALATION_SOLUTION | RESPIRATORY_TRACT | Status: DC | PRN
  Administered 2021-08-08 – 2021-08-09 (×4): 2.5 mg via RESPIRATORY_TRACT
  Filled 2021-08-06 (×6): qty 3

## 2021-08-06 MED ORDER — HYDRALAZINE HCL 20 MG/ML IJ SOLN: 10.0000 mg | Freq: Four times a day (QID) | INTRAMUSCULAR | Status: DC | PRN

## 2021-08-06 MED ORDER — SPIRONOLACTONE 25 MG PO TABS
25.0000 mg | ORAL_TABLET | Freq: Every day | ORAL | Status: DC
  Administered 2021-08-06 – 2021-08-12 (×7): 25 mg via ORAL
  Filled 2021-08-06 (×7): qty 1

## 2021-08-06 MED ORDER — LACTULOSE 10 GM/15ML PO SOLN: 10.0000 g | Freq: Two times a day (BID) | ORAL | Status: DC | PRN

## 2021-08-06 MED ORDER — INSULIN ASPART 100 UNIT/ML IJ SOLN
0.0000 [IU] | Freq: Three times a day (TID) | INTRAMUSCULAR | Status: DC
  Administered 2021-08-06: 3 [IU] via SUBCUTANEOUS
  Administered 2021-08-07 (×2): 1 [IU] via SUBCUTANEOUS
  Administered 2021-08-08 (×3): 2 [IU] via SUBCUTANEOUS
  Administered 2021-08-09 (×2): 1 [IU] via SUBCUTANEOUS
  Administered 2021-08-09 – 2021-08-11 (×3): 2 [IU] via SUBCUTANEOUS
  Administered 2021-08-11: 3 [IU] via SUBCUTANEOUS
  Administered 2021-08-12: 5 [IU] via SUBCUTANEOUS
  Administered 2021-08-12: 3 [IU] via SUBCUTANEOUS

## 2021-08-06 MED ORDER — FLUTICASONE PROPIONATE 50 MCG/ACT NA SUSP
1.0000 | Freq: Every day | NASAL | Status: DC
  Administered 2021-08-06: 2 via NASAL
  Administered 2021-08-07: 1 via NASAL
  Administered 2021-08-08: 2 via NASAL
  Administered 2021-08-09: 1 via NASAL
  Administered 2021-08-10 – 2021-08-12 (×3): 2 via NASAL
  Filled 2021-08-06: qty 16

## 2021-08-06 MED ORDER — POTASSIUM CHLORIDE CRYS ER 20 MEQ PO TBCR
40.0000 meq | EXTENDED_RELEASE_TABLET | Freq: Once | ORAL | Status: AC
  Administered 2021-08-06: 40 meq via ORAL
  Filled 2021-08-06: qty 2

## 2021-08-06 NOTE — Progress Notes (Signed)
Heart Failure Nurse Navigator Progress Note  Following to assess for HV TOC readiness.   Pending ECHO.   Complicated hospitalization d/t mixed COPD, CHF, and cirrhosis. Will follow to determine need/timing of HV TOC clinic appt.   Pricilla Holm, MSN, RN Heart Failure Nurse Navigator [phone]

## 2021-08-06 NOTE — Assessment & Plan Note (Addendum)
Continuing PPI daily

## 2021-08-06 NOTE — Assessment & Plan Note (Addendum)
-  Continuing home regimen of as needed clonazepam & Zoloft

## 2021-08-06 NOTE — Assessment & Plan Note (Addendum)
Patient presenting with several month history of progressively worsening dyspnea on exertion, peripheral edema and cough with increasing abdominal girth  BNP found to be markedly elevated at 550 with chest x-ray identifying cardiomegaly with pulmonary edema all concerning for cardiogenic volume overload  I believe patient is primarily suffering from acute congestive heart failure however there likely is a component of some degree of volume overload due to previously undiagnosed cirrhosis  Patient has been given a dose of 60 mg of intravenous Lasix by the emergency department provider, will continue Lasix 40 mg IV twice daily and adjust accordingly based on clinical response  Resume home regimen of spironolactone as well considering hypokalemia and liver disease.  Echocardiogram ordered for the morning Strict input and output monitoring Supplemental oxygen for bouts of hypoxia Monitoring renal function and electrolytes with serial chemistires Daily weights

## 2021-08-06 NOTE — Assessment & Plan Note (Addendum)
-  Elevated troponin level likely due to LV strain.  Downtrending.

## 2021-08-06 NOTE — Progress Notes (Signed)
PROGRESS NOTE  Pamela Ho  DOB: April 27, 1971  PCP: Jilda Panda, MD IWL:[id number]  DOA: 08/05/2021  LOS: 0 days  Hospital Day: 2  Brief narrative: Pamela Ho is a 51 y.o. female with PMH significant for Obesity, HTN, chronic smoking, COPD, GERD, anxiety/depression. Patient presented to the ED on 08/05/2021 with complaint of progressively worsening shortness of breath Patient reports shortness of breath for last several months which has been progressively worsening particularly in the last 1 week.  She has associated cough, orthopnea, bilateral lower extremity edema, abdominal distention. EMS noted her to have wheezing, gave bronchodilators and a dose of Solu-Medrol and brought to the ED.  In the ED, patient was afebrile, heart rate in 80s, respiratory rate 22, blood pressure 127/67, breathing on room air Labs significant for sodium low at 134, potassium low at 2.8, BUN/creatinine elevated to 36/1.33, total bilirubin 3, BNP elevated to 550, troponin elevated to 41, WC count normal at 4.4, hemoglobin 11.5, platelet 110 Respiratory virus panel including influenza and COVID PCR negative  Chest x-ray showed mild cardiomegaly and interstitial edema EKG with normal sinus rhythm at 83 bpm, QTc 394 ms, no ST-T wave changes Ultrasound abdomen showed findings consistent with liver cirrhosis, mildly prominent portal vein, minimal therapeutic ascites and a small right pleural effusion  Patient was given 1 dose of Lasix IV 60 mg in the ED. Admitted to hospitalist service for further evaluation and management.   Subjective: Patient was seen and examined this morning.  Pleasant middle-aged Caucasian female.  Lying down in bed.  On 2 L oxygen by nasal cannula.  Trying to catch up sleep from last night. Chart reviewed.  Hemodynamically stable Labs this morning with potassium still low at 3, BUN/creatinine 35/1.31.  Assessment/Plan: Principal Problem:   Acute congestive heart  failure (HCC) Active Problems:   Acute pulmonary edema (HCC)   Essential hypertension   Elevated troponin level not due myocardial infarction   Nicotine dependence, cigarettes, uncomplicated   Cirrhosis of liver (HCC)   Generalized anxiety disorder   GERD without esophagitis   Hypokalemia   Volume overload status -Likely from combination of liver cirrhosis with portal hypertension and probable underlying CHF. -Patient presented with several months of progressive shortness of breath, cough, abdominal distention, bilateral pedal edema, orthopnea. -Elevated BNP, chest x-ray with pulm edema -Given IV Lasix in the ED, currently on 40 mg IV twice daily -Pending echocardiogram -Currently on 3 L oxygen by nasal cannula.  Liver cirrhosis with portal hypertension Hyperammonemia -It seems patient has a history of cirrhosis since 2016.  But she states she hurt it first time this admission.   -Unclear etiology.  Denies alcohol use.  On admission, hepatitis panel and serologic work-up was sent. -Not enough fluid pocket to drain ascites -Home meds include metoprolol succinate 50 mg daily, chlorthalidone 25 mg daily, Aldactone 25 mg daily. -Currently on IV Lasix.  Continue metoprolol and Aldactone. -Continue to monitor blood pressure -Ammonia level elevated to 51.  Will start on lactulose. Recent Labs  Lab 08/05/21 2054 08/06/21 0415  AST 23 21  ALT 33 29  ALKPHOS 86 77  BILITOT 3.0* 2.2*  PROT 6.2* 5.8*  ALBUMIN 2.7* 2.5*  AMMONIA  --  51*  INR  --  SPECIMEN CLOTTED   AKI -Baseline creatinine not available.  Presented with creatinine elevated 1.33.  Expect improvement with decongestion. Recent Labs    08/05/21 2054 08/06/21 0415  BUN 36* 35*  CREATININE 1.33* 1.31*   Hypokalemia -Potassium was  significantly low at 2.8 on presentation.  Replaced.  Still low at 3 this morning.  Continue replacement.  Monitor in light of IV diuresis Recent Labs  Lab 08/05/21 2054 08/05/21 2101  08/06/21 0415  K 2.8* 2.8* 3.0*  MG  --   --  1.8   Elevated troponin  -Likely due to LV strain.  Seems downtrending. Recent Labs    08/05/21 2054 08/05/21 2227  TROPONINIHS 41* 39*   Hyperglycemia -blood sugar level was elevated to 346 this morning.  No history of diabetes.  Obtain A1c -Start on sliding scale insulin No results found for: HGBA1C No results for input(s): GLUCAP in the last 168 hours.  GERD -Continue PPI  Chronic smoking -Patient states he smokes 1-1/2 pack/day.  Counseled to quit.  Nicotine patch offered  Generalized anxiety disorder -Continue Zoloft and as needed Klonopin  Mobility: Encourage ambulation Goals of care   Code Status: Full Code    Nutritional status:  Body mass index is 37.9 kg/m.      Diet:  Diet Order             Diet heart healthy/carb modified Room service appropriate? Yes; Fluid consistency: Thin  Diet effective now                   DVT prophylaxis:  enoxaparin (LOVENOX) injection 40 mg Start: 08/06/21 1000   Antimicrobials: None Fluid: None Consultants: None Family Communication: Family at bedside  Status is: Observation  Continue in-hospital care because: Needs IV diuresis, pending further work-up Level of care: Telemetry Cardiac   Dispo: The patient is from: Home              Anticipated d/c is to: Hopefully home in 2 to 3 days              Patient currently is not medically stable to d/c.   Difficult to place patient No     Infusions:    Scheduled Meds:  enoxaparin (LOVENOX) injection  40 mg Subcutaneous Q24H   furosemide  40 mg Intravenous BID   insulin aspart  0-5 Units Subcutaneous QHS   insulin aspart  0-9 Units Subcutaneous TID WC   metoprolol succinate  50 mg Oral Daily   pantoprazole  40 mg Oral Daily   pravastatin  20 mg Oral Daily   sertraline  150 mg Oral Daily   spironolactone  25 mg Oral Daily    PRN meds: acetaminophen **OR** acetaminophen, albuterol, clonazePAM, guaiFENesin,  hydrALAZINE, lactulose, ondansetron **OR** ondansetron (ZOFRAN) IV, polyethylene glycol   Antimicrobials: Anti-infectives (From admission, onward)    None       Objective: Vitals:   08/06/21 1001 08/06/21 1057  BP:  97/69  Pulse:  72  Resp:  19  Temp: 97.6 F (36.4 C) (!) 97.5 F (36.4 C)  SpO2:  90%    Intake/Output Summary (Last 24 hours) at 08/06/2021 1258 Last data filed at 08/06/2021 1244 Gross per 24 hour  Intake --  Output 1325 ml  Net -1325 ml   Filed Weights   08/05/21 2025 08/06/21 1057  Weight: 86.2 kg 94 kg   Weight change:  Body mass index is 37.9 kg/m.   Physical Exam: General exam: Middle-aged Caucasian female.  Looks older for her age.  Not in physical distress Skin: No rashes, lesions or ulcers. HEENT: Atraumatic, normocephalic, no obvious bleeding Lungs: Diminished air entry in both bases CVS: Regular rate and rhythm, no murmur GI/Abd soft, nontender, nondistended, bowel  sound present CNS: Sleepy, opens eyes on verbal command.  Able to follow motor commands. Psychiatry: Sad affect Extremities: Trace to 1+ bilateral pedal edema  Data Review: I have personally reviewed the laboratory data and studies available.  F/u labs ordered Unresulted Labs (From admission, onward)     Start     Ordered   08/07/21 0500  CBC with Differential/Platelet  Daily,   R      08/06/21 0938   08/07/21 0349  Basic metabolic panel  Daily,   R      08/06/21 0938   08/07/21 0500  Ammonia  Tomorrow morning,   R        08/06/21 0938   08/07/21 0500  HIV Antibody (routine testing w rflx)  Once,   R        08/07/21 0500   08/06/21 1258  Hemoglobin A1c  Add-on,   AD       Comments: To assess prior glycemic control    08/06/21 1257   08/06/21 0535  CBC with Differential/Platelet  Once,   R        08/06/21 0535   08/06/21 0500  CBC WITH DIFFERENTIAL  Tomorrow morning,   R        08/05/21 2305   08/06/21 0500  Hepatitis panel, acute  Tomorrow morning,   R         08/06/21 0202   08/06/21 0500  Antinuclear Antibodies, IFA  Tomorrow morning,   R        08/06/21 0322   08/06/21 0500  Anti-smooth muscle antibody, IgG  Tomorrow morning,   R        08/06/21 0322   08/06/21 0500  AntiMicrosomal Ab-Liver / Kidney  Tomorrow morning,   R        08/06/21 0322            Signed, Terrilee Croak, MD [facility] 08/06/2021

## 2021-08-06 NOTE — Assessment & Plan Note (Addendum)
Hypomagnesemia  - replaced

## 2021-08-06 NOTE — Progress Notes (Signed)
Mobility Specialist Progress Note:   08/06/21 1620  Mobility  Activity Transferred to/from [facility]  Level of Assistance Standby assist, set-up cues, supervision of patient - no hands on  Assistive Device None  Distance Ambulated (ft) 2 ft  Activity Response Tolerated well  $Mobility charge 1 Mobility   Pt requesting to get on BSC. Not requiring any physical assist throughout transfer. Pt left on [facility] educated to call NSG when finished. RN aware.   Nelta Numbers [facility] Phone: [phone] Office Phone: [phone]

## 2021-08-06 NOTE — Progress Notes (Signed)
Heart Failure Nurse Navigator Progress Note  Attempted to assess for HV TOC readiness. Pt currently using BSC. Family at bedside.   Will attempt interview at a later time.  Pricilla Holm, MSN, RN Heart Failure Nurse Navigator [phone]

## 2021-08-06 NOTE — Progress Notes (Signed)
Inpatient Diabetes Program Recommendations  AACE/ADA: New Consensus Statement on Inpatient Glycemic Control (2015)  Target Ranges:  Prepandial:   less than 140 mg/dL      Peak postprandial:   less than 180 mg/dL (1-2 hours)      Critically ill patients:  140 - 180 mg/dL   No results found for: GLUCAP, HGBA1C  Review of Glycemic Control  Latest Reference Range & Units 08/06/21 04:15  Glucose 70 - 99 mg/dL 346 (H)  (H): Data is abnormally high  Inpatient Diabetes Program Recommendations:   Please consider: -Glycemic control order set with Novolog 0-9 units tid + hs 0-5 hs -A1c if appropriate  Thank you, Bethena Roys E. Clark Clowdus, RN, MSN, CDE  Diabetes Coordinator Inpatient Glycemic Control Team Team Pager [phone] (8am-5pm) 08/06/2021 12:07 PM

## 2021-08-06 NOTE — ED Notes (Signed)
Breakfast orders Placed

## 2021-08-06 NOTE — Assessment & Plan Note (Addendum)
° °

## 2021-08-06 NOTE — Assessment & Plan Note (Addendum)
-  smoking 1- 1.5 PPD since age 51 - Patient has been counseled to stop smoking

## 2021-08-06 NOTE — Assessment & Plan Note (Addendum)
-  It seems patient has a history of cirrhosis since 2016.    -Unclear etiology.  Denies alcohol use.  - Acute hepatitis panel negative. AMA/ASMA/ANA negative.  - ? If congestive hepatopathy from right heart failure vs NASH

## 2021-08-06 NOTE — TOC Progression Note (Signed)
Transition of Care [facility]) - Progression Note    Patient Details  Name: Pamela Ho MRN: [id number] Date of Birth: [birth year]  Transition of Care [facility]) CM/SW Contact  Zenon Mayo, RN Phone Number: 08/06/2021, 1:47 PM  Clinical Narrative:     Transition of Care [facility]) Screening Note   Patient Details  Name: Pamela Ho Date of Birth: Mar 30, 1971   Transition of Care [facility]) CM/SW Contact:    Zenon Mayo, RN Phone Number: 08/06/2021, 1:47 PM    From home, with SOB, CHF,  Cirrhosis of liver, TOC will continue to monitor patient advancement through interdisciplinary progression rounds. If new patient transition needs arise, please place a TOC consult.          Expected Discharge Plan and Services                                                 Social Determinants of Health (SDOH) Interventions    Readmission Risk Interventions No flowsheet data found.

## 2021-08-06 NOTE — Assessment & Plan Note (Signed)
·   Pulmonary edema seen on chest x-ray felt to primarily be cardiogenic in nature due to congestive heart failure  Providing patient with diuretics, with as needed antitussives and supplemental oxygen necessary  Remainder of assessment and plan as above

## 2021-08-06 NOTE — Progress Notes (Signed)
Patient arrived onto the unit from ED. Tele monitor applied and CCMD notified. [place] replaced. VS obtained and stable. Provided education on heart healthy diet and the importance of daily weight to patient's and patients daughter and husband at bedside. Patient, patient's daughter and husband acknowledge understanding.

## 2021-08-07 ENCOUNTER — Encounter (HOSPITAL_COMMUNITY): Payer: Self-pay | Admitting: Internal Medicine

## 2021-08-07 DIAGNOSIS — R739 Hyperglycemia, unspecified: Secondary | ICD-10-CM | POA: Diagnosis present

## 2021-08-07 DIAGNOSIS — J9601 Acute respiratory failure with hypoxia: Secondary | ICD-10-CM | POA: Diagnosis present

## 2021-08-07 DIAGNOSIS — N179 Acute kidney failure, unspecified: Secondary | ICD-10-CM | POA: Diagnosis present

## 2021-08-07 DIAGNOSIS — E7439 Other disorders of intestinal carbohydrate absorption: Secondary | ICD-10-CM | POA: Diagnosis present

## 2021-08-07 DIAGNOSIS — I5033 Acute on chronic diastolic (congestive) heart failure: Secondary | ICD-10-CM

## 2021-08-07 LAB — CBC WITH DIFFERENTIAL/PLATELET
Abs Immature Granulocytes: 0 10*3/uL (ref 0.00–0.07)
Basophils Absolute: 0 10*3/uL (ref 0.0–0.1)
Basophils Relative: 0 %
Eosinophils Absolute: 0 10*3/uL (ref 0.0–0.5)
Eosinophils Relative: 0 %
HCT: 42.6 % (ref 36.0–46.0)
Hemoglobin: 10.8 g/dL — ABNORMAL LOW (ref 12.0–15.0)
Lymphocytes Relative: 10 %
Lymphs Abs: 0.7 10*3/uL (ref 0.7–4.0)
MCH: 20.6 pg — ABNORMAL LOW (ref 26.0–34.0)
MCHC: 25.4 g/dL — ABNORMAL LOW (ref 30.0–36.0)
MCV: 81.3 fL (ref 80.0–100.0)
Monocytes Absolute: 0.4 10*3/uL (ref 0.1–1.0)
Monocytes Relative: 6 %
Neutro Abs: 5.5 10*3/uL (ref 1.7–7.7)
Neutrophils Relative %: 84 %
Platelets: 101 10*3/uL — ABNORMAL LOW (ref 150–400)
RBC: 5.24 MIL/uL — ABNORMAL HIGH (ref 3.87–5.11)
RDW: 22.7 % — ABNORMAL HIGH (ref 11.5–15.5)
WBC: 6.5 10*3/uL (ref 4.0–10.5)
nRBC: 0.9 % — ABNORMAL HIGH (ref 0.0–0.2)
nRBC: 4 /100 WBC — ABNORMAL HIGH

## 2021-08-07 LAB — BASIC METABOLIC PANEL
Anion gap: 10 (ref 5–15)
BUN: 24 mg/dL — ABNORMAL HIGH (ref 6–20)
CO2: 41 mmol/L — ABNORMAL HIGH (ref 22–32)
Calcium: 10.1 mg/dL (ref 8.9–10.3)
Chloride: 88 mmol/L — ABNORMAL LOW (ref 98–111)
Creatinine, Ser: 1.04 mg/dL — ABNORMAL HIGH (ref 0.44–1.00)
GFR, Estimated: >60 mL/min (ref >60)
Glucose, Bld: 114 mg/dL — ABNORMAL HIGH (ref 70–99)
Potassium: 2.6 mmol/L — CL (ref 3.5–5.1)
Sodium: 139 mmol/L (ref 135–145)

## 2021-08-07 LAB — GLUCOSE, CAPILLARY
Glucose-Capillary: 115 mg/dL — ABNORMAL HIGH (ref 70–99)
Glucose-Capillary: 150 mg/dL — ABNORMAL HIGH (ref 70–99)
Glucose-Capillary: 149 mg/dL — ABNORMAL HIGH (ref 70–99)
Glucose-Capillary: 155 mg/dL — ABNORMAL HIGH (ref 70–99)

## 2021-08-07 LAB — HIV ANTIBODY (ROUTINE TESTING W REFLEX): HIV Screen 4th Generation wRfx: NONREACTIVE

## 2021-08-07 LAB — ANTI-SMOOTH MUSCLE ANTIBODY, IGG: F-Actin IgG: 5 Units (ref 0–19)

## 2021-08-07 LAB — ANTI-MICROSOMAL ANTIBODY LIVER / KIDNEY: LKM1 Ab: 1.3 Units (ref 0.0–20.0)

## 2021-08-07 LAB — AMMONIA: Ammonia: 40 umol/L — ABNORMAL HIGH (ref 9–35)

## 2021-08-07 MED ORDER — FUROSEMIDE 10 MG/ML IJ SOLN
40.0000 mg | Freq: Two times a day (BID) | INTRAMUSCULAR | Status: DC
  Administered 2021-08-07 – 2021-08-08 (×2): 40 mg via INTRAVENOUS
  Filled 2021-08-07 (×2): qty 4

## 2021-08-07 MED ORDER — FUROSEMIDE 40 MG PO TABS
40.0000 mg | ORAL_TABLET | Freq: Two times a day (BID) | ORAL | Status: DC
  Administered 2021-08-07: 40 mg via ORAL
  Filled 2021-08-07: qty 1

## 2021-08-07 MED ORDER — POTASSIUM CHLORIDE CRYS ER 20 MEQ PO TBCR
40.0000 meq | EXTENDED_RELEASE_TABLET | Freq: Once | ORAL | Status: AC
  Administered 2021-08-07: 40 meq via ORAL
  Filled 2021-08-07: qty 2

## 2021-08-07 MED ORDER — POTASSIUM CHLORIDE CRYS ER 20 MEQ PO TBCR
40.0000 meq | EXTENDED_RELEASE_TABLET | ORAL | Status: AC
  Administered 2021-08-07 (×2): 40 meq via ORAL
  Filled 2021-08-07 (×2): qty 2

## 2021-08-07 MED ORDER — POTASSIUM CHLORIDE 10 MEQ/100ML IV SOLN
10.0000 meq | INTRAVENOUS | Status: DC
  Filled 2021-08-07: qty 100

## 2021-08-07 NOTE — Progress Notes (Signed)
PROGRESS NOTE  Pamela Ho  DOB: 14-Oct-1970  PCP: Jilda Panda, MD ZSW:[id number]  DOA: 08/05/2021  LOS: 1 day  Hospital Day: 3  Brief narrative: Pamela Ho is a 51 y.o. female with PMH significant for Obesity, HTN, chronic smoking, COPD, GERD, anxiety/depression. Patient presented to the ED on 08/05/2021 with complaint of progressively worsening shortness of breath Patient reports shortness of breath for last several months which has been progressively worsening particularly in the last 1 week.  She has associated cough, orthopnea, bilateral lower extremity edema, abdominal distention. EMS noted her to have wheezing, gave bronchodilators and a dose of Solu-Medrol and brought to the ED.  In the ED, patient was afebrile, heart rate in 80s, respiratory rate 22, blood pressure 127/67, breathing on room air Labs significant for sodium low at 134, potassium low at 2.8, BUN/creatinine elevated to 36/1.33, total bilirubin 3, BNP elevated to 550, troponin elevated to 41, WC count normal at 4.4, hemoglobin 11.5, platelet 110 Respiratory virus panel including influenza and COVID PCR negative  Chest x-ray showed mild cardiomegaly and interstitial edema EKG with normal sinus rhythm at 83 bpm, QTc 394 ms, no ST-T wave changes Ultrasound abdomen showed findings consistent with liver cirrhosis, mildly prominent portal vein, minimal therapeutic ascites and a small right pleural effusion  Patient was given 1 dose of Lasix IV 60 mg in the ED. Admitted to hospitalist service for further evaluation and management.   Subjective: Patient was seen and examined this morning.   Lying on bed.  Remains on 3 L oxygen by nasal cannula.  Feels better than yesterday.  Daughter at bedside.  Labs from this morning with significantly low potassium.  Assessment/Plan: Active Problems:   Acute on chronic diastolic CHF (congestive heart failure) (HCC)   Acute respiratory failure with hypoxia (HCC)    Cirrhosis of liver (HCC)   AKI (acute kidney injury) (HCC)   Hypokalemia   Elevated troponin level not due myocardial infarction   Hyperglycemia   GERD without esophagitis   Generalized anxiety disorder   Nicotine dependence, cigarettes, uncomplicated   Assessment and Plan: Acute on chronic diastolic CHF (congestive heart failure) ([place]) Essential hypertension -Patient presented with several months of progressive shortness of breath, cough, abdominal distention, bilateral pedal edema, orthopnea. -Elevated BNP, chest x-ray with pulm edema -Echocardiogram with EF 60 to 65%, G2 DD, and RVSP 59 -Given IV Lasix in the ED, currently on 40 mg IV twice daily. -About 4 L of negative balance since admission. -Net IO Since Admission: -3,850.16 mL [08/07/21 1404] -Continue to monitor for daily intake output, weight, blood pressure, BNP, renal function and electrolytes. Recent Labs  Lab 08/05/21 2054 08/05/21 2101 08/06/21 0415 08/07/21 0501  BNP 550.9*  --   --   --   BUN 36*  --  35* 24*  CREATININE 1.33*  --  1.31* 1.04*  K 2.8* 2.8* 3.0* 2.6*  MG  --   --  1.8  --     Essential hypertension    Acute respiratory failure with hypoxia ([place])- (present on admission) - Due to CHF exacerbation.  Currently on 3 L oxygen by nasal cannula.  Wean down as tolerated. Check ambulatory oxygen requirement.  Cirrhosis of liver ([place])- (present on admission) -It seems patient has a history of cirrhosis since 2016.  But she states she hurt it first time this admission.   -Unclear etiology.  Denies alcohol use.  On admission, hepatitis panel and serologic work-up was sent.  Acute hepatitis panel  negative. AMA/ASMA/ANA negative.  I wonder if patient had liver cirrhosis due to congestive hepatopathy from right heart failure. -Ultrasound paracentesis was tried but not enough fluid pocket to drain ascites -Home meds include metoprolol succinate 50 mg daily, chlorthalidone 25 mg daily, Aldactone 25 mg  daily. -Currently on IV Lasix.  Continue metoprolol and Aldactone. -Continue to monitor blood pressure -Ammonia level elevated to 51.  started on lactulose. Recent Labs  Lab 08/05/21 2054 08/06/21 0415 08/07/21 0501  AST 23 21  --   ALT 33 29  --   ALKPHOS 86 77  --   BILITOT 3.0* 2.2*  --   PROT 6.2* 5.8*  --   ALBUMIN 2.7* 2.5*  --   AMMONIA  --  51* 40*  INR  --  SPECIMEN CLOTTED  --     AKI (acute kidney injury) ([place])- (present on admission) -Baseline creatinine not available.  Presented with creatinine elevated 1.33.  Expect improvement with decongestion. Recent Labs    08/05/21 2054 08/06/21 0415 08/07/21 0501  BUN 36* 35* 24*  CREATININE 1.33* 1.31* 1.04*     Hypokalemia- (present on admission) -Potassium continues to run low despite replacement.  She is on IV Lasix.  Continue aggressive replacement.  Continue to monitor. Recent Labs  Lab 08/05/21 2054 08/05/21 2101 08/06/21 0415 08/07/21 0501  K 2.8* 2.8* 3.0* 2.6*  MG  --   --  1.8  --     Elevated troponin level not due myocardial infarction- (present on admission) -Elevated troponin level likely due to LV strain.  Downtrending.   Hyperglycemia- (present on admission) -blood sugar level was elevated to 346 this morning.  No history of diabetes.  Obtain A1c -Start on sliding scale insulin Recent Labs  Lab 08/06/21 1545 08/06/21 2150 08/07/21 0554 08/07/21 1126  GLUCAP 201* 184* 115* 150*    GERD without esophagitis- (present on admission) Continuing home regimen of daily PPI therapy.   Generalized anxiety disorder- (present on admission) -Continuing home regimen of as needed clonazepam -Continue home regimen of Zoloft  Nicotine dependence, cigarettes, uncomplicated- (present on admission) Counseling patient on smoking cessation daily   Mobility: Encourage ambulation Goals of care   Code Status: Full Code    Nutritional status:  Body mass index is 36.94 kg/m.      Diet:  Diet  Order             Diet heart healthy/carb modified Room service appropriate? Yes; Fluid consistency: Thin  Diet effective now                   DVT prophylaxis:  enoxaparin (LOVENOX) injection 40 mg Start: 08/06/21 1000   Antimicrobials: None Fluid: None Consultants: None Family Communication: Daughter at bedside  Status is: Observation  Continue in-hospital care because: Needs IV diuresis, pending further work-up Level of care: Telemetry Cardiac   Dispo: The patient is from: Home              Anticipated d/c is to: Hopefully home in 2 to 3 days              Patient currently is not medically stable to d/c.   Difficult to place patient No     Infusions:    Scheduled Meds:  enoxaparin (LOVENOX) injection  40 mg Subcutaneous Q24H   fluticasone  1-2 spray Each Nare Daily   furosemide  40 mg Intravenous BID   insulin aspart  0-5 Units Subcutaneous QHS   insulin aspart  0-9 Units Subcutaneous TID WC   metoprolol succinate  50 mg Oral Daily   pantoprazole  40 mg Oral Daily   pravastatin  20 mg Oral Daily   sertraline  150 mg Oral Daily   spironolactone  25 mg Oral Daily    PRN meds: acetaminophen **OR** acetaminophen, albuterol, clonazePAM, guaiFENesin, hydrALAZINE, lactulose, ondansetron **OR** ondansetron (ZOFRAN) IV, polyethylene glycol   Antimicrobials: Anti-infectives (From admission, onward)    None       Objective: Vitals:   08/07/21 1100 08/07/21 1124  BP: (!) 107/48 106/61  Pulse: 71 75  Resp: 20 20  Temp: 98.6 F (37 C) 98.5 F (36.9 C)  SpO2: 91% 92%    Intake/Output Summary (Last 24 hours) at 08/07/2021 1417 Last data filed at 08/07/2021 1355 Gross per 24 hour  Intake 374.84 ml  Output 3000 ml  Net -2625.16 ml   Filed Weights   08/05/21 2025 08/06/21 1057 08/07/21 0526  Weight: 86.2 kg 94 kg 91.6 kg   Weight change: 7.817 kg Body mass index is 36.94 kg/m.   Physical Exam: General exam: Middle-aged Caucasian female.  Looks  older for her age.  Not in physical distress Skin: No rashes, lesions or ulcers. HEENT: Atraumatic, normocephalic, no obvious bleeding Lungs: Diminished air entry in both bases CVS: Regular rate and rhythm, no murmur GI/Abd soft, nontender, nondistended, bowel sound present CNS: Sleepy, opens eyes on verbal command.  Able to follow motor commands. Psychiatry: Sad affect Extremities: Trace to 1+ bilateral pedal edema  Data Review: I have personally reviewed the laboratory data and studies available.  F/u labs ordered Unresulted Labs (From admission, onward)     Start     Ordered   08/08/21 0500  Magnesium  Tomorrow morning,   STAT        08/07/21 0815   08/07/21 0500  CBC with Differential/Platelet  Daily,   R      08/06/21 0938   08/07/21 4037  Basic metabolic panel  Daily,   R      08/06/21 0938   08/06/21 0535  CBC with Differential/Platelet  Once,   R        08/06/21 0535   08/06/21 0500  CBC WITH DIFFERENTIAL  Tomorrow morning,   R        08/05/21 2305   08/06/21 0500  Antinuclear Antibodies, IFA  Tomorrow morning,   R        08/06/21 0322            Signed, Terrilee Croak, MD [facility] 08/07/2021

## 2021-08-07 NOTE — Assessment & Plan Note (Addendum)
-  History of diabetes mellitus.  A1c 5.5.  Blood sugar level was initially elevated because of stress.  Currently running consistently less than 200. Recent Labs  Lab 08/08/21 1150 08/08/21 1612 08/08/21 2056 08/09/21 0420 08/09/21 1059  GLUCAP 156* 185* 143* 127* 142*

## 2021-08-07 NOTE — Assessment & Plan Note (Addendum)
-  Baseline creatinine not available.  Presented with creatinine elevated to 1.33.    Recent Labs    08/05/21 2054 08/06/21 0415 08/07/21 0501 08/08/21 0500 08/09/21 0455  BUN 36* 35* 24* 18 12  CREATININE 1.33* 1.31* 1.04* 0.92 0.74

## 2021-08-07 NOTE — Assessment & Plan Note (Addendum)
With pulmonary hypertension, RV failure and essential hypertension - Question portal pulmonary hypertension -Patient presented with several months of progressive shortness of breath, cough, abdominal distention, bilateral pedal edema, orthopnea. -Elevated BNP, chest x-ray with pulm edema -Echocardiogram with EF 60 to 65%, G2 DD, and RVSP 59  -Also continued on home meds that include metoprolol succinate 50 mg daily and Aldactone 25 mg daily. -Net IO Since Admission: -6,820.16 mL [08/09/21 1318] - transition to Lasix 40 mg daily per heart failure team - holding ACE I

## 2021-08-07 NOTE — Assessment & Plan Note (Deleted)
-  Primarily due to to CHF exacerbation.  Also contributed by pleural effusion, may have underlying COPD as he is a lifelong smoker, may have undiagnosed obstructive sleep apnea.   -Echocardiogram showed right ventricular systolic pressure elevated to 59.  She is currently requiring 7 L of oxygen at rest despite significant diuresis.   -CT chest from 2/15 showed pleural effusion but no other pulmonary parenchymal disease.  Will get cardiology consultation.  She may benefit from right heart cath.

## 2021-08-07 NOTE — Progress Notes (Signed)
Provided dietary and fluid restriction education with patient and patient's family at the bedside. Patient verbalize understanding of importance of low sodium heart healthy diet. With patient's permission, patient answered questions related to patient's plan of care with patient's parents.

## 2021-08-07 NOTE — Progress Notes (Signed)
Heart Failure Nurse Navigator Progress Note  PCP: Jilda Panda, MD PCP-Cardiologist: none Admission Diagnosis: COPD Admitted from: home with family  Presentation:   Pamela Ho presented 2/13 with increased SOB x several weeks. Pt resting in bed sitting criss-cross on 3 LPM via [place]. Spouse at bedside. Patient interactive with interview process. Pt states she is on room air at home, tolerated walking halls with staff well. No assistive devices. Pt states she smokes 1.5PPD x 36 years. Pt states she has high anxiety--takes lexapro or zoloft in the past. Feel zoloft works better for her. Pt has no insurance and is unemployed. Pt on room air at home prior to admission. Has PCP with [facility]. Would like to establish with [facility] if available/needs arise for specialist. Pt states she does not drive due to panic, spouse will drive her.  Explained benefits of Heart & Vascular Transitions of Care Clinic appointment, patient agreeable.    ECHO/ LVEF: 55-60%, G2DD, moderately reduced RV.   Clinical Course:  Past Medical History:  Diagnosis Date   Acute congestive heart failure ([place]) 08/05/2021   Anemia    Anxiety    COPD (chronic obstructive pulmonary disease) (HCC)    Depression    GERD (gastroesophageal reflux disease)    Heart murmur    Hypertension      Social History   Socioeconomic History   Marital status: Married    Spouse name: Katarzyna Wolven   Number of children: 2   Years of education: Not on file   Highest education level: Some college, no degree  Occupational History   Occupation: unemployed  Tobacco Use   Smoking status: Every Day    Packs/day: 1.50    Years: 30.00    Pack years: 45.00    Types: Cigarettes   Smokeless tobacco: Never  Vaping Use   Vaping Use: Never used  Substance and Sexual Activity   Alcohol use: No    Alcohol/week: 0.0 standard drinks   Drug use: No   Sexual activity: Not on file  Other Topics Concern   Not on file   Social History Narrative   Not on file   Social Determinants of Health   Financial Resource Strain: High Risk   Difficulty of Paying Living Expenses: Hard  Food Insecurity: No Food Insecurity   Worried About Running Out of Food in the Last Year: Never true   Ran Out of Food in the Last Year: Never true  Transportation Needs: No Transportation Needs   Lack of Transportation (Medical): No   Lack of Transportation (Non-Medical): No  Physical Activity: Not on file  Stress: Not on file  Social Connections: Not on file    High Risk Criteria for Readmission and/or Poor Patient Outcomes: Heart failure hospital admissions (last 6 months): 1  No Show rate: 14% Difficult social situation: no Demonstrates medication adherence: no, missed medications d/t cost Primary Language: English Literacy level: able to read/write and comprehend.   Barriers of Care:   -medication compliance/cost -tobacco dependence -no insurance -unemployed  Considerations/Referrals:   Referral made to Heart Failure Pharmacist Stewardship: no Referral made to Heart Failure CSW/NCM TOC: yes, appreciated Referral made to Heart & Vascular TOC clinic: yes, 2/22 @ noon  Items for Follow-up on DC/TOC: -optimize -no insurance -medication cost    Pricilla Holm, MSN, RN Heart Failure Nurse Navigator [phone]

## 2021-08-07 NOTE — Progress Notes (Addendum)
Heart Failure Navigation Team Progress Note  PCP: Jilda Panda, MD Primary Cardiologist: N/A Admitted from: home  Past Medical History:  Diagnosis Date   Acute congestive heart failure ([place]) 08/05/2021   Anemia    Anxiety    COPD (chronic obstructive pulmonary disease) (HCC)    Depression    GERD (gastroesophageal reflux disease)    Heart murmur    Hypertension     Social History   Socioeconomic History   Marital status: Married    Spouse name: Stevee Valenta   Number of children: 2   Years of education: Not on file   Highest education level: Some college, no degree  Occupational History   Occupation: unemployed  Tobacco Use   Smoking status: Every Day    Packs/day: 1.50    Years: 30.00    Pack years: 45.00    Types: Cigarettes   Smokeless tobacco: Never  Vaping Use   Vaping Use: Never used  Substance and Sexual Activity   Alcohol use: No    Alcohol/week: 0.0 standard drinks   Drug use: No   Sexual activity: Not on file  Other Topics Concern   Not on file  Social History Narrative   Not on file   Social Determinants of Health   Financial Resource Strain: High Risk   Difficulty of Paying Living Expenses: Hard  Food Insecurity: No Food Insecurity   Worried About Running Out of Food in the Last Year: Never true   Ran Out of Food in the Last Year: Never true  Transportation Needs: No Transportation Needs   Lack of Transportation (Medical): No   Lack of Transportation (Non-Medical): No  Physical Activity: Not on file  Stress: Not on file  Social Connections: Not on file     Heart & Vascular Transition of Care Clinic follow-up: Scheduled for 08/15/21 at noon.  Confirmed transportation.  Immediate social needs: health insurance  HF CSW reached out to [employer] to see about the patient being screened for Medicaid.  Mardel Grudzien, MSW, LCSW [phone] Heart Failure Social Worker

## 2021-08-07 NOTE — Evaluation (Signed)
Physical Therapy Evaluation Patient Details Name: Pamela Ho MRN: [id number] DOB: 1970/11/18 Today's Date: 08/07/2021  History of Present Illness  51 yo admitted 2/12 with SOB and acute CHF. PMhx: HTN, COPD, obesity, nicotine dependence  Clinical Impression  Pt with flat affect and reports struggling with depression and change in medication for months. Pt reports she does not wear O2 at home and feels more SOB since admission. Pt on 4L on arrival at 98% with drop to 87% on 1L and increased time and cues for breathing to recover to 94% on 5L. Pt required 6L during gait to maintain 93%. Pt with decreased strength, balance, transfers and cardiopulmonary tolerance who will benefit from acute therapy to maximize mobility and safety. Pt with initial education for low sodium diet, walking program and pulse ox for home use.        Recommendations for follow up therapy are one component of a multi-disciplinary discharge planning process, led by the attending physician.  Recommendations may be updated based on patient status, additional functional criteria and insurance authorization.  Follow Up Recommendations Home health PT    Assistance Recommended at Discharge Set up Supervision/Assistance  Patient can return home with the following  Assistance with cooking/housework;Assist for transportation;A little help with walking and/or transfers    Equipment Recommendations Rolling walker (2 wheels)  Recommendations for Other Services       Functional Status Assessment Patient has had a recent decline in their functional status and demonstrates the ability to make significant improvements in function in a reasonable and predictable amount of time.     Precautions / Restrictions Precautions Precautions: Fall;Other (comment) Precaution Comments: watch sats      Mobility  Bed Mobility Overal bed mobility: Modified Independent                  Transfers Overall transfer level:  Modified independent                      Ambulation/Gait Ambulation/Gait assistance: Min guard Gait Distance (Feet): 140 Feet Assistive device: None Gait Pattern/deviations: Step-through pattern, Decreased stride length, Wide base of support   Gait velocity interpretation: 1.31 - 2.62 ft/sec, indicative of limited community ambulator   General Gait Details: pt with 2 partial LOB with physical assist to recover balance. Pt with preference for holding rail for stability with 2 standing rest breaks and sats 93% on 6L with gait  Stairs            Wheelchair Mobility    Modified Rankin (Stroke Patients Only)       Balance Overall balance assessment: Needs assistance   Sitting balance-Leahy Scale: Good Sitting balance - Comments: sitting EOB, toilet and chair without assist     Standing balance-Leahy Scale: Good Standing balance comment: pt able to stand without assist and perform limited gait without support but LOB with increased distance                             Pertinent Vitals/Pain Pain Assessment Pain Assessment: No/denies pain    Home Living Family/patient expects to be discharged to:: Private residence Living Arrangements: Spouse/significant other;Children Available Help at Discharge: Family;Available 24 hours/day Type of Home: House Home Access: Level entry       Home Layout: One level Home Equipment: None Additional Comments: pt states she hasn't left the house significantly for years due to anxiety/depression    Prior Function Prior  Level of Function : Independent/Modified Independent               ADLs Comments: family does the housework, cooking and grocery shopping     Hand Dominance        Extremity/Trunk Assessment   Upper Extremity Assessment Upper Extremity Assessment: Generalized weakness    Lower Extremity Assessment Lower Extremity Assessment: Generalized weakness    Cervical / Trunk  Assessment Cervical / Trunk Assessment: Other exceptions Cervical / Trunk Exceptions: rounded shoulders  Communication   Communication: No difficulties  Cognition Arousal/Alertness: Awake/alert Behavior During Therapy: Flat affect Overall Cognitive Status: Within Functional Limits for tasks assessed                                 General Comments: pt and daughter report pt has been struggling with depression for years but since November medication change has significantly impacted her mental health and function        General Comments      Exercises     Assessment/Plan    PT Assessment Patient needs continued PT services  PT Problem List Decreased mobility;Decreased activity tolerance;Cardiopulmonary status limiting activity;Decreased balance;Decreased knowledge of use of DME       PT Treatment Interventions Gait training;Balance training;Functional mobility training;Therapeutic activities;Patient/family education;Neuromuscular re-education;Therapeutic exercise;DME instruction    PT Goals (Current goals can be found in the Care Plan section)  Acute Rehab PT Goals Patient Stated Goal: return home, work on [employer] PT Goal Formulation: With patient/family Time For Goal Achievement: 08/21/21 Potential to Achieve Goals: Fair    Frequency Min 3X/week     Co-evaluation               AM-PAC PT "6 Clicks" Mobility  Outcome Measure Help needed turning from your back to your side while in a flat bed without using bedrails?: None Help needed moving from lying on your back to sitting on the side of a flat bed without using bedrails?: None Help needed moving to and from a bed to a chair (including a wheelchair)?: A Little Help needed standing up from a chair using your arms (e.g., wheelchair or bedside chair)?: A Little Help needed to walk in hospital room?: A Little Help needed climbing 3-5 steps with a railing? : A Little 6 Click Score: 20    End of Session  Equipment Utilized During Treatment: Oxygen Activity Tolerance: Patient tolerated treatment well Patient left: in chair;with call bell/phone within reach;with family/visitor present Nurse Communication: Mobility status PT Visit Diagnosis: Other abnormalities of gait and mobility (R26.89);Difficulty in walking, not elsewhere classified (R26.2)    Time: 0100-7121 PT Time Calculation (min) (ACUTE ONLY): 33 min   Charges:   PT Evaluation $PT Eval Moderate Complexity: 1 Mod PT Treatments $Gait Training: 8-22 mins        Nickola Lenig P, PT Acute Rehabilitation Services Pager: [phone] Office: [phone]   Anayla Giannetti B Evony Rezek 08/07/2021, 1:03 PM

## 2021-08-07 NOTE — Progress Notes (Signed)
SATURATION QUALIFICATIONS: (This note is used to comply with regulatory documentation for home oxygen)  Patient Saturations on 1L at Rest = 87%  Patient Saturations on 4L at rest  = 95%  Patient Saturations on 6 Liters of oxygen while Ambulating = 93%  Please briefly explain why patient needs home oxygen: Pt requires supplemental oxygen at all times to maintain sats >90% Pamela Ho, PT Acute Rehabilitation Services Pager: [phone] Office: [phone]

## 2021-08-08 ENCOUNTER — Inpatient Hospital Stay (HOSPITAL_COMMUNITY): Payer: Self-pay

## 2021-08-08 DIAGNOSIS — J9601 Acute respiratory failure with hypoxia: Secondary | ICD-10-CM

## 2021-08-08 LAB — CBC WITH DIFFERENTIAL/PLATELET
Abs Immature Granulocytes: 0.05 10*3/uL (ref 0.00–0.07)
Basophils Absolute: 0 10*3/uL (ref 0.0–0.1)
Basophils Relative: 0 %
Eosinophils Absolute: 0 10*3/uL (ref 0.0–0.5)
Eosinophils Relative: 0 %
HCT: 40.6 % (ref 36.0–46.0)
Hemoglobin: 10.7 g/dL — ABNORMAL LOW (ref 12.0–15.0)
Immature Granulocytes: 1 %
Lymphocytes Relative: 14 %
Lymphs Abs: 0.8 10*3/uL (ref 0.7–4.0)
MCH: 21.1 pg — ABNORMAL LOW (ref 26.0–34.0)
MCHC: 26.4 g/dL — ABNORMAL LOW (ref 30.0–36.0)
MCV: 80.1 fL (ref 80.0–100.0)
Monocytes Absolute: 0.4 10*3/uL (ref 0.1–1.0)
Monocytes Relative: 7 %
Neutro Abs: 4.5 10*3/uL (ref 1.7–7.7)
Neutrophils Relative %: 78 %
Platelets: 90 10*3/uL — ABNORMAL LOW (ref 150–400)
RBC: 5.07 MIL/uL (ref 3.87–5.11)
RDW: 22.3 % — ABNORMAL HIGH (ref 11.5–15.5)
Smear Review: DECREASED
WBC: 5.8 10*3/uL (ref 4.0–10.5)
nRBC: 0.7 % — ABNORMAL HIGH (ref 0.0–0.2)

## 2021-08-08 LAB — BASIC METABOLIC PANEL
Anion gap: 8 (ref 5–15)
BUN: 18 mg/dL (ref 6–20)
CO2: 42 mmol/L — ABNORMAL HIGH (ref 22–32)
Calcium: 9.8 mg/dL (ref 8.9–10.3)
Chloride: 89 mmol/L — ABNORMAL LOW (ref 98–111)
Creatinine, Ser: 0.92 mg/dL (ref 0.44–1.00)
GFR, Estimated: >60 mL/min (ref >60)
Glucose, Bld: 159 mg/dL — ABNORMAL HIGH (ref 70–99)
Potassium: 3.4 mmol/L — ABNORMAL LOW (ref 3.5–5.1)
Sodium: 139 mmol/L (ref 135–145)

## 2021-08-08 LAB — GLUCOSE, CAPILLARY
Glucose-Capillary: 160 mg/dL — ABNORMAL HIGH (ref 70–99)
Glucose-Capillary: 156 mg/dL — ABNORMAL HIGH (ref 70–99)
Glucose-Capillary: 185 mg/dL — ABNORMAL HIGH (ref 70–99)
Glucose-Capillary: 143 mg/dL — ABNORMAL HIGH (ref 70–99)

## 2021-08-08 LAB — MAGNESIUM: Magnesium: 1.6 mg/dL — ABNORMAL LOW (ref 1.7–2.4)

## 2021-08-08 MED ORDER — IOHEXOL 350 MG/ML SOLN
80.0000 mL | Freq: Once | INTRAVENOUS | Status: AC | PRN
  Administered 2021-08-08: 80 mL via INTRAVENOUS

## 2021-08-08 MED ORDER — FUROSEMIDE 40 MG PO TABS
40.0000 mg | ORAL_TABLET | Freq: Two times a day (BID) | ORAL | Status: DC
  Administered 2021-08-08 – 2021-08-09 (×3): 40 mg via ORAL
  Filled 2021-08-08 (×3): qty 1

## 2021-08-08 MED ORDER — POTASSIUM CHLORIDE CRYS ER 20 MEQ PO TBCR
40.0000 meq | EXTENDED_RELEASE_TABLET | Freq: Once | ORAL | Status: AC
  Administered 2021-08-08: 40 meq via ORAL
  Filled 2021-08-08: qty 2

## 2021-08-08 MED ORDER — MAGNESIUM SULFATE 4 GM/100ML IV SOLN
4.0000 g | Freq: Once | INTRAVENOUS | Status: AC
  Administered 2021-08-08: 4 g via INTRAVENOUS
  Filled 2021-08-08: qty 100

## 2021-08-08 NOTE — Progress Notes (Signed)
Called by RN to give PRN neb. Patient sound asleep and daughter stated if patient wakes up and wants a neb then she will call again. Vitals stable.

## 2021-08-08 NOTE — Progress Notes (Signed)
PROGRESS NOTE  Pamela Ho  DOB: 1970/06/27  PCP: Jilda Panda, MD CVE:[id number]  DOA: 08/05/2021  LOS: 2 days  Hospital Day: 4  Brief narrative: Pamela Ho is a 51 y.o. female with PMH significant for Obesity, HTN, chronic smoking, COPD, GERD, anxiety/depression. Patient presented to the ED on 08/05/2021 with complaint of progressively worsening shortness of breath Patient reports shortness of breath for last several months which has been progressively worsening particularly in the last 1 week.  She has associated cough, orthopnea, bilateral lower extremity edema, abdominal distention. EMS noted her to have wheezing, gave bronchodilators and a dose of Solu-Medrol and brought to the ED.  In the ED, patient was afebrile, heart rate in 80s, respiratory rate 22, blood pressure 127/67, breathing on room air Labs significant for sodium low at 134, potassium low at 2.8, BUN/creatinine elevated to 36/1.33, total bilirubin 3, BNP elevated to 550, troponin elevated to 41, WC count normal at 4.4, hemoglobin 11.5, platelet 110 Respiratory virus panel including influenza and COVID PCR negative  Chest x-ray showed mild cardiomegaly and interstitial edema EKG with normal sinus rhythm at 83 bpm, QTc 394 ms, no ST-T wave changes Ultrasound abdomen showed findings consistent with liver cirrhosis, mildly prominent portal vein, minimal therapeutic ascites and a small right pleural effusion  Patient was given 1 dose of Lasix IV 60 mg in the ED. Admitted to hospitalist service for further evaluation and management.   Subjective: Patient was seen and examined this afternoon.  Lying on bed.  Still requiring 6 L oxygen by nasal cannula. Chest clear on auscultation except for mild cough on deep breathing. Daughter at bedside.  Assessment/Plan: Active Problems:   Acute on chronic diastolic CHF (congestive heart failure) (HCC)   Acute respiratory failure with hypoxia (HCC)   Cirrhosis of  liver (HCC)   AKI (acute kidney injury) (HCC)   Hypokalemia   Elevated troponin level not due myocardial infarction   Hyperglycemia   GERD without esophagitis   Generalized anxiety disorder   Nicotine dependence, cigarettes, uncomplicated   Assessment and Plan: Acute on chronic diastolic CHF (congestive heart failure) ([place]) Essential hypertension -Patient presented with several months of progressive shortness of breath, cough, abdominal distention, bilateral pedal edema, orthopnea. -Elevated BNP, chest x-ray with pulm edema -Echocardiogram with EF 60 to 65%, G2 DD, and RVSP 59 -Currently on IV Lasix 40 mg twice daily with more than 6 L of negative balance.  Switch to oral diuresis today. -Net IO Since Admission: -6,040.16 mL [08/08/21 1421] -Continue to monitor for daily intake output, weight, blood pressure, BNP, renal function and electrolytes. Recent Labs  Lab 08/05/21 2054 08/05/21 2101 08/06/21 0415 08/07/21 0501 08/08/21 0500  BNP 550.9*  --   --   --   --   BUN 36*  --  35* 24* 18  CREATININE 1.33*  --  1.31* 1.04* 0.92  K 2.8* 2.8* 3.0* 2.6* 3.4*  MG  --   --  1.8  --  1.6*    Essential hypertension    Acute respiratory failure with hypoxia ([place])- (present on admission) - Primarily due to to CHF exacerbation.  However patient is requiring more oxygen despite significant diuresis.  She also has underlying COPD and may have sleep apnea.  She is requiring 6 L oxygen today.   I reviewed her last CT scan from 2016 which had suggested sarcoidosis versus lymphoproliferative disorder.  It seems she never had a repeat scan or a follow-up with pulmonologist.   -  I discussed that CT scan with radiologist on-call.  He recommended repeat CT scan of chest with contrast today.  Based on the findings, we we will decide if she needs pulmonary evaluation in the hospital. -Continue to wean down oxygen as tolerated.  Cirrhosis of liver ([place])- (present on admission) -It seems patient has  a history of cirrhosis since 2016.  But she states she hurt it first time this admission.   -Unclear etiology.  Denies alcohol use.  On admission, hepatitis panel and serologic work-up was sent.  Acute hepatitis panel negative. AMA/ASMA/ANA negative.  I wonder if patient had liver cirrhosis due to congestive hepatopathy from right heart failure. -Ultrasound paracentesis was tried but not enough fluid pocket to drain ascites -Home meds include metoprolol succinate 50 mg daily, chlorthalidone 25 mg daily, Aldactone 25 mg daily. -Currently on IV Lasix.  Continue metoprolol and Aldactone. -Continue to monitor blood pressure -On admission, ammonia level was elevated to 51.  started on lactulose.  Improving. Recent Labs  Lab 08/05/21 2054 08/06/21 0415 08/07/21 0501  AST 23 21  --   ALT 33 29  --   ALKPHOS 86 77  --   BILITOT 3.0* 2.2*  --   PROT 6.2* 5.8*  --   ALBUMIN 2.7* 2.5*  --   AMMONIA  --  51* 40*  INR  --  SPECIMEN CLOTTED  --     AKI (acute kidney injury) ([place])- (present on admission) -Baseline creatinine not available.  Presented with creatinine elevated to 1.33.  Improving renal function with decongestion. Recent Labs    08/05/21 2054 08/06/21 0415 08/07/21 0501 08/08/21 0500  BUN 36* 35* 24* 18  CREATININE 1.33* 1.31* 1.04* 0.92     Hypokalemia- (present on admission) Hypomagnesemia  -potassium and magnesium continues to run low despite replacement probably because of IV Lasix.  Labs this morning with potassium low at 3.4 magnesium low at 1.6. Recent Labs  Lab 08/05/21 2054 08/05/21 2101 08/06/21 0415 08/07/21 0501 08/08/21 0500  K 2.8* 2.8* 3.0* 2.6* 3.4*  MG  --   --  1.8  --  1.6*    Elevated troponin level not due myocardial infarction- (present on admission) -Elevated troponin level likely due to LV strain.  Downtrending.   Hyperglycemia- (present on admission) -History of diabetes mellitus.  A1c 5.5.  Blood sugar level was initially elevated because  of stress.  Currently running consistently less than 200. Recent Labs  Lab 08/07/21 1126 08/07/21 1722 08/07/21 2031 08/08/21 0459 08/08/21 1150  GLUCAP 150* 149* 155* 160* 156*    GERD without esophagitis- (present on admission) Continuing PPI daily  Generalized anxiety disorder- (present on admission) -Continuing home regimen of as needed clonazepam -Continue home regimen of Zoloft  Nicotine dependence, cigarettes, uncomplicated- (present on admission) Counseling patient on smoking cessation daily   Mobility: Encourage ambulation Goals of care   Code Status: Full Code    Nutritional status:  Body mass index is 36.73 kg/m.      Diet:  Diet Order             Diet heart healthy/carb modified Room service appropriate? Yes; Fluid consistency: Thin  Diet effective now                   DVT prophylaxis:  enoxaparin (LOVENOX) injection 40 mg Start: 08/06/21 1000   Antimicrobials: None Fluid: None Consultants: None Family Communication: Daughter at bedside  Status is: Observation  Continue in-hospital care because: Needs IV diuresis, pending  further work-up Level of care: Telemetry Cardiac   Dispo: The patient is from: Home              Anticipated d/c is to: Hopefully home in 2 to 3 days              Patient currently is not medically stable to d/c.   Difficult to place patient No     Infusions:    Scheduled Meds:  enoxaparin (LOVENOX) injection  40 mg Subcutaneous Q24H   fluticasone  1-2 spray Each Nare Daily   furosemide  40 mg Oral BID   insulin aspart  0-5 Units Subcutaneous QHS   insulin aspart  0-9 Units Subcutaneous TID WC   metoprolol succinate  50 mg Oral Daily   pantoprazole  40 mg Oral Daily   pravastatin  20 mg Oral Daily   sertraline  150 mg Oral Daily   spironolactone  25 mg Oral Daily    PRN meds: acetaminophen **OR** acetaminophen, albuterol, clonazePAM, guaiFENesin, hydrALAZINE, lactulose, ondansetron **OR** ondansetron  (ZOFRAN) IV, polyethylene glycol   Antimicrobials: Anti-infectives (From admission, onward)    None       Objective: Vitals:   08/08/21 0817 08/08/21 1148  BP: (!) 112/59 103/67  Pulse: 73 65  Resp: 18 19  Temp: 97.8 F (36.6 C) 98.1 F (36.7 C)  SpO2: 93% 94%    Intake/Output Summary (Last 24 hours) at 08/08/2021 1437 Last data filed at 08/08/2021 1148 Gross per 24 hour  Intake 360 ml  Output 2550 ml  Net -2190 ml   Filed Weights   08/06/21 1057 08/07/21 0526 08/08/21 0519  Weight: 94 kg 91.6 kg 91.1 kg   Weight change: -2.9 kg Body mass index is 36.73 kg/m.   Physical Exam: General exam: Middle-aged Caucasian female.  Looks older for her age.  Not in physical distress Skin: No rashes, lesions or ulcers. HEENT: Atraumatic, normocephalic, no obvious bleeding Lungs: Diminished air entry in both bases.  No wheezing or crackles. CVS: Regular rate and rhythm, no murmur GI/Abd soft, nontender, nondistended, bowel sound present CNS: Sleepy, opens eyes on verbal command.  Able to follow motor commands. Psychiatry: Sad affect Extremities: No pedal edema, no calf tenderness  Data Review: I have personally reviewed the laboratory data and studies available.  F/u labs ordered Unresulted Labs (From admission, onward)     Start     Ordered   08/07/21 0500  CBC with Differential/Platelet  Daily,   R      08/06/21 0938   08/07/21 9265  Basic metabolic panel  Daily,   R      08/06/21 0938   08/06/21 0535  CBC with Differential/Platelet  Once,   R        08/06/21 0535   08/06/21 0500  CBC WITH DIFFERENTIAL  Tomorrow morning,   R        08/05/21 2305   08/06/21 0500  Antinuclear Antibodies, IFA  Tomorrow morning,   R        08/06/21 0322            Signed, Terrilee Croak, MD [facility] 08/08/2021

## 2021-08-08 NOTE — Progress Notes (Signed)
MD ordered ABG & Bipap. I went to do these & patient is sitting up eating breakfast in no distress. She says NO to both ABG & Bipap. She's currently on 6L [place] with SpO2-94%. She says she feels fine just a little congested this morning. Will continue to monitor.

## 2021-08-08 NOTE — Progress Notes (Signed)
Patient resting comfortably off BiPAP at this time. Vitals stable.

## 2021-08-09 ENCOUNTER — Inpatient Hospital Stay (HOSPITAL_COMMUNITY): Payer: Self-pay

## 2021-08-09 DIAGNOSIS — J90 Pleural effusion, not elsewhere classified: Secondary | ICD-10-CM

## 2021-08-09 DIAGNOSIS — E278 Other specified disorders of adrenal gland: Secondary | ICD-10-CM

## 2021-08-09 HISTORY — PX: IR THORACENTESIS ASP PLEURAL SPACE W/IMG GUIDE: IMG5380

## 2021-08-09 LAB — BASIC METABOLIC PANEL
Anion gap: 9 (ref 5–15)
BUN: 12 mg/dL (ref 6–20)
CO2: 45 mmol/L — ABNORMAL HIGH (ref 22–32)
Calcium: 9.5 mg/dL (ref 8.9–10.3)
Chloride: 86 mmol/L — ABNORMAL LOW (ref 98–111)
Creatinine, Ser: 0.74 mg/dL (ref 0.44–1.00)
GFR, Estimated: >60 mL/min (ref >60)
Glucose, Bld: 109 mg/dL — ABNORMAL HIGH (ref 70–99)
Potassium: 3.2 mmol/L — ABNORMAL LOW (ref 3.5–5.1)
Sodium: 140 mmol/L (ref 135–145)

## 2021-08-09 LAB — CBC WITH DIFFERENTIAL/PLATELET
Abs Immature Granulocytes: 0.03 10*3/uL (ref 0.00–0.07)
Basophils Absolute: 0 10*3/uL (ref 0.0–0.1)
Basophils Relative: 0 %
Eosinophils Absolute: 0 10*3/uL (ref 0.0–0.5)
Eosinophils Relative: 1 %
HCT: 39.4 % (ref 36.0–46.0)
Hemoglobin: 10 g/dL — ABNORMAL LOW (ref 12.0–15.0)
Immature Granulocytes: 1 %
Lymphocytes Relative: 13 %
Lymphs Abs: 0.8 10*3/uL (ref 0.7–4.0)
MCH: 20.7 pg — ABNORMAL LOW (ref 26.0–34.0)
MCHC: 25.4 g/dL — ABNORMAL LOW (ref 30.0–36.0)
MCV: 81.4 fL (ref 80.0–100.0)
Monocytes Absolute: 0.4 10*3/uL (ref 0.1–1.0)
Monocytes Relative: 7 %
Neutro Abs: 4.6 10*3/uL (ref 1.7–7.7)
Neutrophils Relative %: 78 %
Platelets: 95 10*3/uL — ABNORMAL LOW (ref 150–400)
RBC: 4.84 MIL/uL (ref 3.87–5.11)
RDW: 21.8 % — ABNORMAL HIGH (ref 11.5–15.5)
WBC: 5.9 10*3/uL (ref 4.0–10.5)
nRBC: 0.3 % — ABNORMAL HIGH (ref 0.0–0.2)

## 2021-08-09 LAB — BODY FLUID CELL COUNT WITH DIFFERENTIAL
Eos, Fluid: 1 %
Lymphs, Fluid: 39 %
Monocyte-Macrophage-Serous Fluid: 4 % — ABNORMAL LOW (ref 50–90)
Neutrophil Count, Fluid: 55 % — ABNORMAL HIGH (ref 0–25)
Other Cells, Fluid: 1 %
Total Nucleated Cell Count, Fluid: 2347 cu mm — ABNORMAL HIGH (ref 0–1000)

## 2021-08-09 LAB — GLUCOSE, PLEURAL OR PERITONEAL FLUID: Glucose, Fluid: 122 mg/dL

## 2021-08-09 LAB — ANTINUCLEAR ANTIBODIES, IFA: ANA Ab, IFA: NEGATIVE

## 2021-08-09 LAB — PROTEIN, PLEURAL OR PERITONEAL FLUID: Total protein, fluid: <3 g/dL

## 2021-08-09 LAB — ALBUMIN, PLEURAL OR PERITONEAL FLUID: Albumin, Fluid: <1.5 g/dL

## 2021-08-09 LAB — LACTATE DEHYDROGENASE, PLEURAL OR PERITONEAL FLUID: LD, Fluid: 249 U/L — ABNORMAL HIGH (ref 3–23)

## 2021-08-09 LAB — GLUCOSE, CAPILLARY
Glucose-Capillary: 127 mg/dL — ABNORMAL HIGH (ref 70–99)
Glucose-Capillary: 142 mg/dL — ABNORMAL HIGH (ref 70–99)
Glucose-Capillary: 193 mg/dL — ABNORMAL HIGH (ref 70–99)
Glucose-Capillary: 108 mg/dL — ABNORMAL HIGH (ref 70–99)

## 2021-08-09 MED ORDER — LIDOCAINE HCL 1 % IJ SOLN
INTRAMUSCULAR | Status: AC
  Filled 2021-08-09: qty 20

## 2021-08-09 MED ORDER — LIDOCAINE HCL (PF) 1 % IJ SOLN
INTRAMUSCULAR | Status: DC | PRN
  Administered 2021-08-09: 10 mL
  Administered 2021-08-10: 1 mL

## 2021-08-09 MED ORDER — POTASSIUM CHLORIDE CRYS ER 20 MEQ PO TBCR
40.0000 meq | EXTENDED_RELEASE_TABLET | Freq: Once | ORAL | Status: AC
  Administered 2021-08-09: 40 meq via ORAL
  Filled 2021-08-09: qty 2

## 2021-08-09 MED ORDER — PHENYLEPHRINE HCL 1 % NA SOLN
1.0000 [drp] | Freq: Four times a day (QID) | NASAL | Status: DC | PRN
  Administered 2021-08-09: 1 [drp] via NASAL
  Filled 2021-08-09: qty 15

## 2021-08-09 NOTE — Assessment & Plan Note (Deleted)
-  CT scan chest from 2/15 showed moderate to large right pleural effusion.  Thoracentesis today yielded 1.5 L of pleural fluid.  Fluid analysis pending.

## 2021-08-09 NOTE — Assessment & Plan Note (Addendum)
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 -Radiologist recommended adrenal washout CT or chemical shift MRI.  -Consider imaging as outpatient

## 2021-08-09 NOTE — Progress Notes (Signed)
PROGRESS NOTE  Pamela Ho  DOB: March 17, 1971  PCP: Jilda Panda, MD TOI:[id number]  DOA: 08/05/2021  LOS: 3 days  Hospital Day: 5  Brief narrative: Pamela Ho is a 51 y.o. female with PMH significant for Obesity, HTN, chronic smoking, COPD, GERD, anxiety/depression. Patient presented to the ED on 08/05/2021 with complaint of progressively worsening shortness of breath Patient reports shortness of breath for last several months which has been progressively worsening particularly in the last 1 week.  She has associated cough, orthopnea, bilateral lower extremity edema, abdominal distention. EMS noted her to have wheezing, gave bronchodilators and a dose of Solu-Medrol and brought to the ED.  In the ED, patient was afebrile, heart rate in 80s, respiratory rate 22, blood pressure 127/67, breathing on room air Labs significant for sodium low at 134, potassium low at 2.8, BUN/creatinine elevated to 36/1.33, total bilirubin 3, BNP elevated to 550, troponin elevated to 41, WC count normal at 4.4, hemoglobin 11.5, platelet 110 Respiratory virus panel including influenza and COVID PCR negative  Chest x-ray showed mild cardiomegaly and interstitial edema EKG with normal sinus rhythm at 83 bpm, QTc 394 ms, no ST-T wave changes Ultrasound abdomen showed findings consistent with liver cirrhosis, mildly prominent portal vein, minimal therapeutic ascites and a small right pleural effusion  Patient was given 1 dose of Lasix IV 60 mg in the ED. Admitted to hospitalist service for further evaluation and management.   Subjective: Patient was seen and examined this afternoon.  Sitting up in bed.  Feels better than yesterday but is still on 7 L oxygen by nasal cannula.  O2 sat 93%. CT chest from yesterday showed right pleural effusion.  Underwent thoracentesis this morning.  Pending fluid analysis.  Assessment/Plan: Active Problems:   Acute on chronic diastolic CHF (congestive heart  failure) (HCC)   Acute respiratory failure with hypoxia (HCC)   Cirrhosis of liver (HCC)   AKI (acute kidney injury) (HCC)   Hypokalemia   Elevated troponin level not due myocardial infarction   Hyperglycemia   GERD without esophagitis   Generalized anxiety disorder   Nicotine dependence, cigarettes, uncomplicated   Assessment and Plan: Acute on chronic diastolic CHF (congestive heart failure) ([place]) Essential hypertension -Patient presented with several months of progressive shortness of breath, cough, abdominal distention, bilateral pedal edema, orthopnea. -Elevated BNP, chest x-ray with pulm edema -Echocardiogram with EF 60 to 65%, G2 DD, and RVSP 59 -Currently on IV Lasix 40 mg twice daily with more than 6 L of negative balance.  Switch to oral diuresis today. -Net IO Since Admission: -6,040.16 mL [08/08/21 1421] -Continue to monitor for daily intake output, weight, blood pressure, BNP, renal function and electrolytes. Recent Labs  Lab 08/05/21 2054 08/05/21 2101 08/06/21 0415 08/07/21 0501 08/08/21 0500  BNP 550.9*  --   --   --   --   BUN 36*  --  35* 24* 18  CREATININE 1.33*  --  1.31* 1.04* 0.92  K 2.8* 2.8* 3.0* 2.6* 3.4*  MG  --   --  1.8  --  1.6*    Essential hypertension    Acute respiratory failure with hypoxia ([place])- (present on admission) - Primarily due to to CHF exacerbation.  However patient is requiring more oxygen despite significant diuresis.  She also has underlying COPD and may have sleep apnea.  She is requiring 6 L oxygen today.   I reviewed her last CT scan from 2016 which had suggested sarcoidosis versus lymphoproliferative disorder.  It  seems she never had a repeat scan or a follow-up with pulmonologist.   -I discussed that CT scan with radiologist on-call.  He recommended repeat CT scan of chest with contrast today.  Based on the findings, we we will decide if she needs pulmonary evaluation in the hospital. -Continue to wean down oxygen as  tolerated.  Cirrhosis of liver ([place])- (present on admission) -It seems patient has a history of cirrhosis since 2016.  But she states she hurt it first time this admission.   -Unclear etiology.  Denies alcohol use.  On admission, hepatitis panel and serologic work-up was sent.  Acute hepatitis panel negative. AMA/ASMA/ANA negative.  I wonder if patient had liver cirrhosis due to congestive hepatopathy from right heart failure. -Ultrasound paracentesis was tried but not enough fluid pocket to drain ascites -Home meds include metoprolol succinate 50 mg daily, chlorthalidone 25 mg daily, Aldactone 25 mg daily. -Currently on IV Lasix.  Continue metoprolol and Aldactone. -Continue to monitor blood pressure -On admission, ammonia level was elevated to 51.  started on lactulose.  Improving. Recent Labs  Lab 08/05/21 2054 08/06/21 0415 08/07/21 0501  AST 23 21  --   ALT 33 29  --   ALKPHOS 86 77  --   BILITOT 3.0* 2.2*  --   PROT 6.2* 5.8*  --   ALBUMIN 2.7* 2.5*  --   AMMONIA  --  51* 40*  INR  --  SPECIMEN CLOTTED  --     AKI (acute kidney injury) ([place])- (present on admission) -Baseline creatinine not available.  Presented with creatinine elevated to 1.33.  Improving renal function with decongestion. Recent Labs    08/05/21 2054 08/06/21 0415 08/07/21 0501 08/08/21 0500  BUN 36* 35* 24* 18  CREATININE 1.33* 1.31* 1.04* 0.92     Hypokalemia- (present on admission) Hypomagnesemia  -potassium and magnesium continues to run low despite replacement probably because of IV Lasix.  Labs this morning with potassium low at 3.4 magnesium low at 1.6. Recent Labs  Lab 08/05/21 2054 08/05/21 2101 08/06/21 0415 08/07/21 0501 08/08/21 0500  K 2.8* 2.8* 3.0* 2.6* 3.4*  MG  --   --  1.8  --  1.6*    Elevated troponin level not due myocardial infarction- (present on admission) -Elevated troponin level likely due to LV strain.  Downtrending.   Hyperglycemia- (present on  admission) -History of diabetes mellitus.  A1c 5.5.  Blood sugar level was initially elevated because of stress.  Currently running consistently less than 200. Recent Labs  Lab 08/07/21 1126 08/07/21 1722 08/07/21 2031 08/08/21 0459 08/08/21 1150  GLUCAP 150* 149* 155* 160* 156*    GERD without esophagitis- (present on admission) Continuing PPI daily  Generalized anxiety disorder- (present on admission) -Continuing home regimen of as needed clonazepam -Continue home regimen of Zoloft  Nicotine dependence, cigarettes, uncomplicated- (present on admission) Counseling patient on smoking cessation daily   Mobility: Encourage ambulation Goals of care   Code Status: Full Code    Nutritional status:  Body mass index is 35.32 kg/m.      Diet:  Diet Order             Diet heart healthy/carb modified Room service appropriate? Yes; Fluid consistency: Thin  Diet effective now                   DVT prophylaxis:  enoxaparin (LOVENOX) injection 40 mg Start: 08/06/21 1000   Antimicrobials: None Fluid: None Consultants: None Family Communication: Family not  at bedside  Status is: Observation  Continue in-hospital care because: Still remains significantly dyspneic.  Cardiology consulted Level of care: Telemetry Cardiac   Dispo: The patient is from: Home              Anticipated d/c is to: Pending cardiology work-up.              Patient currently is not medically stable to d/c.   Difficult to place patient No     Infusions:    Scheduled Meds:  enoxaparin (LOVENOX) injection  40 mg Subcutaneous Q24H   fluticasone  1-2 spray Each Nare Daily   furosemide  40 mg Oral BID   insulin aspart  0-5 Units Subcutaneous QHS   insulin aspart  0-9 Units Subcutaneous TID WC   lidocaine       metoprolol succinate  50 mg Oral Daily   pantoprazole  40 mg Oral Daily   pravastatin  20 mg Oral Daily   sertraline  150 mg Oral Daily   spironolactone  25 mg Oral Daily    PRN  meds: acetaminophen **OR** acetaminophen, albuterol, clonazePAM, guaiFENesin, hydrALAZINE, lactulose, lidocaine (PF), ondansetron **OR** ondansetron (ZOFRAN) IV, phenylephrine, polyethylene glycol   Antimicrobials: Anti-infectives (From admission, onward)    None       Objective: Vitals:   08/09/21 0929 08/09/21 0942  BP: 120/70 (!) 107/51  Pulse:    Resp:    Temp:    SpO2:      Intake/Output Summary (Last 24 hours) at 08/09/2021 1316 Last data filed at 08/09/2021 0942 Gross per 24 hour  Intake 820 ml  Output 1600 ml  Net -780 ml    Filed Weights   08/07/21 0526 08/08/21 0519 08/09/21 0507  Weight: 91.6 kg 91.1 kg 87.6 kg   Weight change: -3.51 kg Body mass index is 35.32 kg/m.   Physical Exam: General exam: Middle-aged Caucasian female.  Looks older for her age.  On 7 L oxygen by nasal cannula Skin: No rashes, lesions or ulcers. HEENT: Atraumatic, normocephalic, no obvious bleeding Lungs: Diminished air entry in both bases.  No wheezing or crackles. CVS: Regular rate and rhythm, no murmur GI/Abd soft, nontender, nondistended, bowel sound present CNS: Sleepy, opens eyes on verbal command.  Able to follow motor commands. Psychiatry: Sad affect Extremities: No pedal edema, no calf tenderness  Data Review: I have personally reviewed the laboratory data and studies available.  F/u labs ordered Unresulted Labs (From admission, onward)     Start     Ordered   08/09/21 1028  Miscellaneous [facility] test (send-out)  RELEASE UPON ORDERING,   TIMED        08/09/21 1028   08/09/21 1028  Acid Fast Culture with reflexed sensitivities  RELEASE UPON ORDERING,   STAT        08/09/21 1028   08/09/21 1028  Acid Fast Smear (AFB)  RELEASE UPON ORDERING,   STAT        08/09/21 1028   08/06/21 0535  CBC with Differential/Platelet  Once,   R        08/06/21 0535   08/06/21 0500  CBC WITH DIFFERENTIAL  Tomorrow morning,   R        08/05/21 2305            Signed, Terrilee Croak, MD [facility] 08/09/2021

## 2021-08-09 NOTE — Progress Notes (Signed)
RT note. Patient on 8L [place] sat 94%. V60 in patients room if needed later, RT will continue to monitor.

## 2021-08-09 NOTE — Consult Note (Signed)
Cardiology Consultation:   Patient ID: Pamela Ho MRN: [id number]; DOB: May 30, 1971  Admit date: 08/05/2021 Date of Consult: 08/09/2021  PCP:  Jilda Panda, MD   [facility] Providers Cardiologist:  None        Patient Profile:   Pamela Ho is a 51 y.o. female with a hx of obesity, hypertension, chronic smoking, GERD, anxiety depression who is being seen 08/09/2021 for the evaluation of shortness of breath at the request of Dr Terrilee Croak.  History of Present Illness:   Pamela Ho presented to the emergency department to be evaluated for worsening shortness of breath on exertion.  At that time she did have associated cough, orthopnea, bilateral lower extremity edema with dominant extension.  She was evaluated in the emergency room she was negative for flu as well as COVID-19.  Her chest x-ray showed cardiomegaly and interstitial edema.  During her work-up her ultrasound did show consistency with liver cirrhosis and mildly prominent portal vein minimal therapeutic ascites and small right pleural effusion.  She was given IV Lasix.  She was admitted for acute respiratory failure with hypoxia  She is underwent thoracocentesis due to right pleural effusion noted on her CT of the chest.  She is improving with her shortness of breath currently on IV Lasix therefore cardiology has been consulted to evaluate the patient as well.  She was sitting in bed when I arrive, during my encounter her husband is by the bedside.  She tells me the shortness of breath has improved but she still is requiring oxygen she is not on oxygen at home.  Past Medical History:  Diagnosis Date   Acute congestive heart failure ([place]) 08/05/2021   Anemia    Anxiety    COPD (chronic obstructive pulmonary disease) (HCC)    Depression    GERD (gastroesophageal reflux disease)    Heart murmur    Hypertension     Past Surgical History:  Procedure Laterality Date   CHOLECYSTECTOMY     cyst  from neck     IR THORACENTESIS ASP PLEURAL SPACE W/IMG GUIDE  08/09/2021       Inpatient Medications: Scheduled Meds:  enoxaparin (LOVENOX) injection  40 mg Subcutaneous Q24H   fluticasone  1-2 spray Each Nare Daily   furosemide  40 mg Oral BID   insulin aspart  0-5 Units Subcutaneous QHS   insulin aspart  0-9 Units Subcutaneous TID WC   lidocaine       metoprolol succinate  50 mg Oral Daily   pantoprazole  40 mg Oral Daily   pravastatin  20 mg Oral Daily   sertraline  150 mg Oral Daily   spironolactone  25 mg Oral Daily   Continuous Infusions:  PRN Meds: acetaminophen **OR** acetaminophen, albuterol, clonazePAM, guaiFENesin, hydrALAZINE, lactulose, lidocaine (PF), ondansetron **OR** ondansetron (ZOFRAN) IV, phenylephrine, polyethylene glycol  Allergies:    Allergies  Allergen Reactions   Lexapro [Escitalopram]     "Made me crazy"    Lisinopril     "Angioedema"    Social History:   Social History   Socioeconomic History   Marital status: Married    Spouse name: Paislynn Hegstrom   Number of children: 2   Years of education: Not on file   Highest education level: Some college, no degree  Occupational History   Occupation: unemployed  Tobacco Use   Smoking status: Every Day    Packs/day: 1.50    Years: 30.00    Pack years: 45.00  Types: Cigarettes   Smokeless tobacco: Never  Vaping Use   Vaping Use: Never used  Substance and Sexual Activity   Alcohol use: No    Alcohol/week: 0.0 standard drinks   Drug use: No   Sexual activity: Not on file  Other Topics Concern   Not on file  Social History Narrative   Not on file   Social Determinants of Health   Financial Resource Strain: High Risk   Difficulty of Paying Living Expenses: Hard  Food Insecurity: No Food Insecurity   Worried About Running Out of Food in the Last Year: Never true   Ran Out of Food in the Last Year: Never true  Transportation Needs: No Transportation Needs   Lack of Transportation  (Medical): No   Lack of Transportation (Non-Medical): No  Physical Activity: Not on file  Stress: Not on file  Social Connections: Not on file  Intimate Partner Violence: Not on file    Family History:    Family History  Problem Relation Age of Onset   Diabetes Mother    Heart disease Mother    Hyperlipidemia Mother    Diabetes Father    Cancer Father    Heart disease Father    Hyperlipidemia Father    Hypertension Father    Heart disease Maternal Grandmother    Hyperlipidemia Maternal Grandmother    Hypertension Maternal Grandmother    Heart disease Maternal Grandfather    Hyperlipidemia Maternal Grandfather    Hypertension Maternal Grandfather    Heart disease Paternal Grandmother    Hyperlipidemia Paternal Grandmother    Hypertension Paternal Grandmother    Stroke Paternal Grandmother    Diabetes Paternal Grandmother    Cancer Paternal Grandmother    Breast cancer Neg Hx      ROS:  Review of Systems  Constitution: Negative for decreased appetite, fever and weight gain.  HENT: Negative for congestion, ear discharge, hoarse voice and sore throat.   Eyes: Negative for discharge, redness, vision loss in right eye and visual halos.  Cardiovascular: Report dyspnea on exertion.  Negative for chest pain, leg swelling, orthopnea and palpitations.  Respiratory: Negative for cough, hemoptysis, shortness of breath and snoring.   Endocrine: Negative for heat intolerance and polyphagia.  Hematologic/Lymphatic: Negative for bleeding problem. Does not bruise/bleed easily.  Skin: Negative for flushing, nail changes, rash and suspicious lesions.  Musculoskeletal: Negative for arthritis, joint pain, muscle cramps, myalgias, neck pain and stiffness.  Gastrointestinal: Negative for abdominal pain, bowel incontinence, diarrhea and excessive appetite.  Genitourinary: Negative for decreased libido, genital sores and incomplete emptying.  Neurological: Negative for brief paralysis, focal  weakness, headaches and loss of balance.  Psychiatric/Behavioral: Negative for altered mental status, depression and suicidal ideas.  Allergic/Immunologic: Negative for HIV exposure and persistent infections.       Physical Exam/Data:   Vitals:   08/08/21 2301 08/09/21 0507 08/09/21 0929 08/09/21 0942  BP:  116/64 120/70 (!) 107/51  Pulse: 66 66    Resp: (!) 21 20    Temp:  98.2 F (36.8 C)    TempSrc:  Oral    SpO2: 95% 93%    Weight:  87.6 kg    Height:        Intake/Output Summary (Last 24 hours) at 08/09/2021 1627 Last data filed at 08/09/2021 0942 Gross per 24 hour  Intake 700 ml  Output 1600 ml  Net -900 ml   Last 3 Weights 08/09/2021 08/08/2021 08/07/2021  Weight (lbs) 193 lb 1.6 oz 200 lb  13.4 oz 201 lb 15.1 oz  Weight (kg) 87.59 kg 91.1 kg 91.6 kg     Body mass index is 35.32 kg/m.  General:  Well nourished, well developed, in no acute distress HEENT: normal Neck: no JVD Vascular: No carotid bruits; Distal pulses 2+ bilaterally Cardiac:  normal S1, S2; RRR; no murmur  Lungs:  clear to auscultation bilaterally, no wheezing, rhonchi or rales  Abd: soft, nontender, no hepatomegaly  Ext: no edema Musculoskeletal:  No deformities, BUE and BLE strength normal and equal Skin: warm and dry  Neuro:  CNs 2-12 intact, no focal abnormalities noted Psych:  Normal affect   EKG:  The EKG was personally reviewed and demonstrates:  none today  Telemetry:  Telemetry was personally reviewed and demonstrates:  Sinus rhythm  Relevant CV Studies:  TTE 08/06/2021 IMPRESSIONS   1. Left ventricular ejection fraction, by estimation, is 60 to 65%. The left ventricle has normal function. The left ventricle has no regional wall motion abnormalities. There is mild left ventricular hypertrophy.  Left ventricular diastolic parameters are consistent with Grade II diastolic dysfunction (pseudonormalization).  Elevated left atrial pressure. There is the interventricular septum is flattened in  systole and diastole, consistent with right ventricular pressure and volume overload.   2. Right ventricular systolic function is moderately reduced. The right ventricular size is moderately enlarged. There is moderately elevated pulmonary artery systolic pressure. The estimated right ventricular systolic pressure is 87.5 mmHg.   3. Right atrial size was severely dilated.   4. The mitral valve is normal in structure. No evidence of mitral valve regurgitation. No evidence of mitral stenosis.   5. Tricuspid valve regurgitation is mild to moderate.   6. The aortic valve is tricuspid. Aortic valve regurgitation is trivial.No aortic stenosis is present.   7. The inferior vena cava is dilated in size with >50% respiratory variability, suggesting right atrial pressure of 8 mmHg.   FINDINGS   Left Ventricle: Left ventricular ejection fraction, by estimation, is 60 to 65%. The left ventricle has normal function. The left ventricle has no regional wall motion abnormalities. The left ventricular internal cavity size was normal in size. There is   mild left ventricular hypertrophy. The interventricular septum is flattened in systole and diastole, consistent with right ventricular  pressure and volume overload. Left ventricular diastolic parameters are consistent with Grade II diastolic dysfunction   (pseudonormalization). Elevated left atrial pressure.   Right Ventricle: The right ventricular size is moderately enlarged. Right ventricular wall thickness was not well visualized. Right ventricular systolic function is moderately reduced. There is moderately elevated pulmonary artery systolic pressure. The tricuspid regurgitant velocity is 3.57 m/s, and with an assumed right atrial pressure of 8 mmHg, the estimated right ventricular systolic pressure is 64.3 mmHg.   Left Atrium: Left atrial size was normal in size.   Right Atrium: Right atrial size was severely dilated.   Pericardium: Trivial pericardial  effusion is present.   Mitral Valve: The mitral valve is normal in structure. No evidence of mitral valve regurgitation. No evidence of mitral valve stenosis.   Tricuspid Valve: The tricuspid valve is normal in structure. Tricuspid valve regurgitation is mild to moderate.   Aortic Valve: The aortic valve is tricuspid. Aortic valve regurgitation is trivial. No aortic stenosis is present. Aortic valve peak gradient measures 8.2 mmHg.   Pulmonic Valve: The pulmonic valve was not well visualized. Pulmonic valve regurgitation is trivial.   Aorta: The aortic root and ascending aorta are structurally normal, with no  evidence of dilitation.   Venous: The inferior vena cava is dilated in size with greater than 50% respiratory variability, suggesting right atrial pressure of 8 mmHg.   IAS/Shunts: The interatrial septum was not well visualized.   Laboratory Data:  High Sensitivity Troponin:   Recent Labs  Lab 08/05/21 2054 08/05/21 2227  TROPONINIHS 41* 39*     Chemistry Recent Labs  Lab 08/06/21 0415 08/07/21 0501 08/08/21 0500 08/09/21 0455  NA 134* 139 139 140  K 3.0* 2.6* 3.4* 3.2*  CL 91* 88* 89* 86*  CO2 32 41* 42* 45*  GLUCOSE 346* 114* 159* 109*  BUN 35* 24* 18 12  CREATININE 1.31* 1.04* 0.92 0.74  CALCIUM 9.4 10.1 9.8 9.5  MG 1.8  --  1.6*  --   GFRNONAA 49* >60 >60 >60  ANIONGAP _0 Recent Labs  Lab 08/05/21 2054 08/06/21 0415  PROT 6.2* 5.8*  ALBUMIN 2.7* 2.5*  AST 23 21  ALT 33 29  ALKPHOS 86 77  BILITOT 3.0* 2.2*   Lipids No results for input(s): CHOL, TRIG, HDL, LABVLDL, LDLCALC, CHOLHDL in the last 168 hours.  Hematology Recent Labs  Lab 08/07/21 0501 08/08/21 0500 08/09/21 0455  WBC 6.5 5.8 5.9  RBC 5.24* 5.07 4.84  HGB 10.8* 10.7* 10.0*  HCT 42.6 40.6 39.4  MCV 81.3 80.1 81.4  MCH 20.6* 21.1* 20.7*  MCHC 25.4* 26.4* 25.4*  RDW 22.7* 22.3* 21.8*  PLT 101* 90* 95*   Thyroid No results for input(s): TSH, FREET4 in the last 168 hours.   BNP Recent Labs  Lab 08/05/21 2054  BNP 550.9*    DDimer No results for input(s): DDIMER in the last 168 hours.   Radiology/Studies:  DG Chest 1 View  Result Date: 08/09/2021 CLINICAL DATA:  Status post right thoracentesis. EXAM: CHEST  1 VIEW COMPARISON:  08/05/2021. FINDINGS: 0953 hours. Right base collapse/consolidation with small to moderate right pleural effusion, new in the interval. No evidence for pneumothorax. Cardiopericardial silhouette is at upper limits of normal for size. Interstitial markings are diffusely coarsened. IMPRESSION: 1. New right base collapse/consolidation with small to moderate right pleural effusion. 2. No pneumothorax. Electronically Signed   By: Misty Stanley M.D.   On: 08/09/2021 10:02   CT CHEST W CONTRAST  Result Date: 08/08/2021 CLINICAL DATA:  Chronic dyspnea of uncertain etiology, history COPD EXAM: CT CHEST WITH CONTRAST TECHNIQUE: Multidetector CT imaging of the chest was performed during intravenous contrast administration. Imaging performed with patient in RIGHT lateral decubitus position. RADIATION DOSE REDUCTION: This exam was performed according to the departmental dose-optimization program which includes automated exposure control, adjustment of the mA and/or kV according to patient size and/or use of iterative reconstruction technique. CONTRAST:  39m OMNIPAQUE IOHEXOL 350 MG/ML SOLN IV COMPARISON:  02/08/2015 FINDINGS: Cardiovascular: Atherosclerotic calcifications aorta and coronary arteries. Aorta normal caliber. Heart unremarkable. No pericardial effusion. Mediastinum/Nodes: Enlarged RIGHT paratracheal lymph node 12 mm image 55. Additional scattered normal sized mediastinal nodes. Base of cervical region normal appearance. Esophagus unremarkable. Lungs/Pleura: Moderate to large RIGHT pleural effusion. Significant atelectasis of RIGHT lung with complete atelectasis of RIGHT middle and RIGHT lower lobes and partial atelectasis of RIGHT upper lobe.  Scattered infiltrate RIGHT upper lobe. LEFT lung clear. No pneumothorax. Upper Abdomen: Splenomegaly, spleen 21.0 x 5.8 x 16.0 cm (volume = 1000 cm^3). Fatty infiltration of liver. Hypervascular focus posterior RIGHT lobe liver 17 x 11 mm image 140, nonspecific. 2.1 x 1.9 cm LEFT adrenal nodule  measuring 44 Hounsfield units. Remaining visualized upper abdomen unremarkable. Musculoskeletal: No osseous abnormalities. IMPRESSION: Moderate to large RIGHT pleural effusion with complete atelectasis of RIGHT middle and RIGHT lower lobes and partial atelectasis of RIGHT upper lobe. Scattered infiltrate in RIGHT upper lobe. Nonspecific enlarged RIGHT paratracheal lymph node 12 mm short axis. Splenomegaly. 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 Recommend adrenal washout CT or chemical shift MRI. JACR 2017 Aug; 14(8):1038-44, JCAT 2016 Mar-Apr; 40(2):194-200, Urol J 2006 Spring; 3(2):71-4. Fatty infiltration of liver with nonspecific hypervascular focus posterior RIGHT lobe liver 17 x 11 mm, could represent a flash filling hemangioma or other vascular lesion, can be assessed at time of LEFT adrenal mass evaluation. Aortic Atherosclerosis (ICD10-I70.0). Electronically Signed   By: Lavonia Dana M.D.   On: 08/08/2021 15:24   DG Chest Portable 1 View  Result Date: 08/05/2021 CLINICAL DATA:  Shortness of breath EXAM: PORTABLE CHEST 1 VIEW COMPARISON:  02/07/2015 FINDINGS: Mild cardiomegaly. Interstitial prominence may reflect early interstitial edema. No effusions or acute bony abnormality. IMPRESSION: Mild cardiomegaly and interstitial prominence which may reflect interstitial edema. Electronically Signed   By: Rolm Baptise M.D.   On: 08/05/2021 20:55   ECHOCARDIOGRAM COMPLETE  Result Date: 08/06/2021    ECHOCARDIOGRAM REPORT   Patient Name:   [facility] [place] Date of Exam: 08/06/2021 Medical Rec #:  [id number]             Height:       62.0 in Accession #:    [id number]            Weight:       190.0  lb Date of Birth:  1970/07/18            BSA:          1.870 m Patient Age:    60 years              BP:           101/62 mmHg Patient Gender: F                     HR:           82 bpm. Exam Location:  Inpatient Procedure: 2D Echo, Cardiac Doppler and Color Doppler Indications:    CHF  History:        Patient has no prior history of Echocardiogram examinations.                 Risk Factors:Hypertension.  Sonographer:    Jyl Heinz Referring Phys: [id number] [place]  1. Left ventricular ejection fraction, by estimation, is 60 to 65%. The left ventricle has normal function. The left ventricle has no regional wall motion abnormalities. There is mild left ventricular hypertrophy. Left ventricular diastolic parameters are consistent with Grade II diastolic dysfunction (pseudonormalization). Elevated left atrial pressure. There is the interventricular septum is flattened in systole and diastole, consistent with right ventricular pressure and volume overload.  2. Right ventricular systolic function is moderately reduced. The right ventricular size is moderately enlarged. There is moderately elevated pulmonary artery systolic pressure. The estimated right ventricular systolic pressure is 74.7 mmHg.  3. Right atrial size was severely dilated.  4. The mitral valve is normal in structure. No evidence of mitral valve regurgitation. No evidence of mitral stenosis.  5. Tricuspid valve regurgitation is mild to moderate.  6. The aortic valve is tricuspid. Aortic valve regurgitation is trivial. No aortic stenosis is  present.  7. The inferior vena cava is dilated in size with >50% respiratory variability, suggesting right atrial pressure of 8 mmHg. FINDINGS  Left Ventricle: Left ventricular ejection fraction, by estimation, is 60 to 65%. The left ventricle has normal function. The left ventricle has no regional wall motion abnormalities. The left ventricular internal cavity size was normal in size. There is   mild left ventricular hypertrophy. The interventricular septum is flattened in systole and diastole, consistent with right ventricular pressure and volume overload. Left ventricular diastolic parameters are consistent with Grade II diastolic dysfunction  (pseudonormalization). Elevated left atrial pressure. Right Ventricle: The right ventricular size is moderately enlarged. Right vetricular wall thickness was not well visualized. Right ventricular systolic function is moderately reduced. There is moderately elevated pulmonary artery systolic pressure. The tricuspid regurgitant velocity is 3.57 m/s, and with an assumed right atrial pressure of 8 mmHg, the estimated right ventricular systolic pressure is 91.6 mmHg. Left Atrium: Left atrial size was normal in size. Right Atrium: Right atrial size was severely dilated. Pericardium: Trivial pericardial effusion is present. Mitral Valve: The mitral valve is normal in structure. No evidence of mitral valve regurgitation. No evidence of mitral valve stenosis. Tricuspid Valve: The tricuspid valve is normal in structure. Tricuspid valve regurgitation is mild to moderate. Aortic Valve: The aortic valve is tricuspid. Aortic valve regurgitation is trivial. No aortic stenosis is present. Aortic valve peak gradient measures 8.2 mmHg. Pulmonic Valve: The pulmonic valve was not well visualized. Pulmonic valve regurgitation is trivial. Aorta: The aortic root and ascending aorta are structurally normal, with no evidence of dilitation. Venous: The inferior vena cava is dilated in size with greater than 50% respiratory variability, suggesting right atrial pressure of 8 mmHg. IAS/Shunts: The interatrial septum was not well visualized.  LEFT VENTRICLE PLAX 2D LVIDd:         4.20 cm      Diastology LVIDs:         2.70 cm      LV e' medial:    4.79 cm/s LV PW:         0.90 cm      LV E/e' medial:  17.5 LV IVS:        1.00 cm      LV e' lateral:   7.72 cm/s LVOT diam:     2.00 cm      LV  E/e' lateral: 10.8 LV SV:         66 LV SV Index:   35 LVOT Area:     3.14 cm  LV Volumes (MOD) LV vol d, MOD A2C: 111.0 ml LV vol d, MOD A4C: 101.0 ml LV vol s, MOD A2C: 42.1 ml LV vol s, MOD A4C: 36.6 ml LV SV MOD A2C:     68.9 ml LV SV MOD A4C:     101.0 ml LV SV MOD BP:      68.1 ml RIGHT VENTRICLE            IVC RV Basal diam:  4.40 cm    IVC diam: 2.10 cm RV Mid diam:    3.50 cm RV S prime:     9.57 cm/s TAPSE (M-mode): 1.7 cm LEFT ATRIUM             Index        RIGHT ATRIUM           Index LA diam:        3.60 cm 1.92 cm/m   RA  Area:     24.40 cm LA Vol (A2C):   64.3 ml 34.38 ml/m  RA Volume:   80.40 ml  42.98 ml/m LA Vol (A4C):   47.0 ml 25.13 ml/m LA Biplane Vol: 56.9 ml 30.42 ml/m  AORTIC VALVE AV Area (Vmax): 2.31 cm AV Vmax:        143.00 cm/s AV Peak Grad:   8.2 mmHg LVOT Vmax:      105.00 cm/s LVOT Vmean:     79.300 cm/s LVOT VTI:       0.211 m  AORTA Ao Root diam: 2.90 cm Ao Asc diam:  2.80 cm MITRAL VALVE               TRICUSPID VALVE MV Area (PHT): 4.15 cm    TR Peak grad:   51.0 mmHg MV Decel Time: 183 msec    TR Vmax:        357.00 cm/s MV E velocity: 83.60 cm/s MV A velocity: 55.30 cm/s  SHUNTS MV E/A ratio:  1.51        Systemic VTI:  0.21 m                            Systemic Diam: 2.00 cm Oswaldo Milian MD Electronically signed by Oswaldo Milian MD Signature Date/Time: 08/06/2021/1:26:16 PM    Final    [place] ASCITES (ABDOMEN LIMITED)  Result Date: 08/06/2021 CLINICAL DATA:  Evaluate for ascites. EXAM: LIMITED ABDOMEN ULTRASOUND FOR ASCITES TECHNIQUE: Limited ultrasound survey for ascites was performed in all four abdominal quadrants. COMPARISON:  Right upper quadrant ultrasound dated 08/05/2021. FINDINGS: There is a small ascites with the largest pocket in the left upper quadrant measuring approximately 5 cm deep to the peritoneal surface. IMPRESSION: Small ascites. Electronically Signed   By: Anner Crete M.D.   On: 08/06/2021 03:28   US Abdomen Limited RUQ  (LIVER/GB)  Result Date: 08/05/2021 CLINICAL DATA:  Abnormal LFTs. EXAM: ULTRASOUND ABDOMEN LIMITED RIGHT UPPER QUADRANT COMPARISON:  CTA chest 02/08/2015. FINDINGS: Gallbladder: Surgically absent. No notes were made by the tech as to whether or not there was elicited positive right upper abdominal tenderness. Common bile duct: Diameter: 5.1 mm.  No intrahepatic biliary prominence is seen. Liver: No focal lesion identified. The liver is 18.1 cm length with mild increased echogenicity of steatosis. Again, portions of the left lobe demonstrate nodular surface changes of cirrhosis, with prior CT also demonstrating splenomegaly. Portal vein is patent on color Doppler imaging with normal direction of blood flow towards the liver. The portal vein again measuring mildly prominent, 14.3 mm. Other: Small right pleural effusion. Minimal perihepatic ascites. Unremarkable visualized right kidney. IMPRESSION: 1. Mild increased hepatic echogenicity of steatosis and mild hepatic enlargement. 2. Surface nodularity over portions of the left lobe consistent with cirrhosis, was seen in 2016 also with splenomegaly. 3. Mildly prominent portal vein without flow reversal. Minimal perihepatic ascites. 4. Small right pleural effusion. Electronically Signed   By: Telford Nab M.D.   On: 08/05/2021 23:25   IR THORACENTESIS ASP PLEURAL SPACE W/IMG GUIDE  Result Date: 08/09/2021 INDICATION: Chronic dyspnea of uncertain etiology. Right pleural effusion. Request for diagnostic and therapeutic thoracentesis. EXAM: ULTRASOUND GUIDED RIGHT THORACENTESIS MEDICATIONS: 1% lidocaine 10 mL COMPLICATIONS: None immediate. PROCEDURE: An ultrasound guided thoracentesis was thoroughly discussed with the patient and questions answered. The benefits, risks, alternatives and complications were also discussed. The patient understands and wishes to proceed with the procedure. Written consent was obtained.  Ultrasound was performed to localize and mark an  adequate pocket of fluid in the right chest. The area was then prepped and draped in the normal sterile fashion. 1% Lidocaine was used for local anesthesia. Under ultrasound guidance a 6 Fr Safe-T-Centesis catheter was introduced. Thoracentesis was performed. The catheter was removed and a dressing applied. FINDINGS: A total of approximately 1.3 L of clear yellow fluid was removed. Samples were sent to the laboratory as requested by the clinical team. IMPRESSION: Successful ultrasound guided right thoracentesis yielding 1.3 L of pleural fluid. Procedure performed by: Gareth Eagle, PA-C Electronically Signed   By: Miachel Roux M.D.   On: 08/09/2021 13:30     Assessment and Plan:   Acute hypoxic respiratory failure Shortness of breath Right ventricular dysfunction with pulmonary hypertension Mild to moderate tricuspid regurgitation COPD Liver cirrhosis  She has been receiving IV Lasix and has been diuresed close to 6 L and is now on p.o. diuretics.  She still has not been able to be weaned off the oxygen and is hypoxic on room air.  She is status post thoracocentesis.  Clinically she has any right heart failure with noted moderate pulmonary hypertension.  I suspect that her pulmonary hypertension is multifactorial group 1 with liver cirrhosis as well as group 3 from her COPD.  She would benefit from a right heart catheterization and also further work-up for pulmonary hypertension.  She could benefit from phosphodiesterase inhibitors for her pulmonary arterial hypertension but I am getting an advanced heart failure team to evaluate the patient as well.  Defer to them for further pulmonary hypertension work-up and treatment.  I spoke to the patient and her husband in great details about all her clinical picture and the right ventricular dysfunction and her pulmonary hypertension.  COPD is being managed by the primary team  Kidney function has improved.  Previous AKI resolved.   In the meantime we  will keep her on her current medication regimen.   Risk Assessment/Risk Scores:            For questions or updates, please contact [place] Please consult [email or web address] for contact info under    SignedBerniece Salines, DO  08/09/2021 4:27 PM

## 2021-08-09 NOTE — Procedures (Signed)
PROCEDURE SUMMARY:  Successful US guided right thoracentesis. Yielded 1.3 L of clear gold fluid. Patient tolerated procedure well. No immediate complications. EBL = trace  Specimen was sent for labs.  Post procedure chest X-ray pending.  Leory Allinson S Remmie Bembenek PA-C 08/09/2021 9:44 AM

## 2021-08-09 NOTE — Progress Notes (Signed)
Physical Therapy Treatment Patient Details Name: Pamela Ho MRN: [id number] DOB: 02-05-1971 Today's Date: 08/09/2021   History of Present Illness 51 yo admitted 2/12 with SOB and acute CHF. PMhx: HTN, COPD, obesity, nicotine dependence    PT Comments    Pt agreeable to session and eager to mobilize despite reports of some back pain and soreness. The pt was able to tolerate 6L O2 at rest and with ambulation with SpO2 > 94%. The pt did ask to use RW at this time due to feeling unsteady with gait in room. We discussed use of rollator in the future to improve mobility while still providing support and opportunity for seated rest. Will continue to benefit from skilled PT acutely, pt is pleased with progress for today and reports decrease in pain after mobilizing.     Recommendations for follow up therapy are one component of a multi-disciplinary discharge planning process, led by the attending physician.  Recommendations may be updated based on patient status, additional functional criteria and insurance authorization.  Follow Up Recommendations  Home health PT     Assistance Recommended at Discharge Set up Supervision/Assistance  Patient can return home with the following Assistance with cooking/housework;Assist for transportation;A little help with walking and/or transfers   Equipment Recommendations  Rolling walker (2 wheels)    Recommendations for Other Services       Precautions / Restrictions Precautions Precautions: Fall;Other (comment) Precaution Comments: watch O2, on 6L for mobiltiy Restrictions Weight Bearing Restrictions: No     Mobility  Bed Mobility Overal bed mobility: Modified Independent                  Transfers Overall transfer level: Modified independent Equipment used: None               General transfer comment: transferred to [facility] without assist    Ambulation/Gait Ambulation/Gait assistance: Min guard Gait Distance (Feet):  200 Feet Assistive device: Rolling walker (2 wheels) Gait Pattern/deviations: Step-through pattern, Decreased stride length, Wide base of support Gait velocity: functional Gait velocity interpretation: 1.31 - 2.62 ft/sec, indicative of limited community ambulator   General Gait Details: pt reaching for UE support for gait in room, asked for RW. minG for safety with RW, SpO2 stable >94% on 6L       Balance Overall balance assessment: Needs assistance   Sitting balance-Leahy Scale: Good Sitting balance - Comments: sitting EOB, toilet and chair without assist   Standing balance support: Bilateral upper extremity supported Standing balance-Leahy Scale: Fair Standing balance comment: static stand and transfers without DME but reaching for UE support. RW for gait                            Cognition Arousal/Alertness: Awake/alert Behavior During Therapy: Flat affect Overall Cognitive Status: Within Functional Limits for tasks assessed                                 General Comments: pt able to follow all cues and asked to use RW due to reaching for UE support, good insight to safety        Exercises Other Exercises Other Exercises: supine trunk rotation x 5 each way Other Exercises: supine pelvic tilts x 5    General Comments General comments (skin integrity, edema, etc.): Pt on 9L upon my arrival, maintained > 95% at rest on 6L, and >94%  on 6L with gait      Pertinent Vitals/Pain Pain Assessment Pain Assessment: Faces Faces Pain Scale: Hurts a little bit Pain Location: back Pain Descriptors / Indicators: Discomfort Pain Intervention(s): Monitored during session (improved after mobility)     PT Goals (current goals can now be found in the care plan section) Acute Rehab PT Goals Patient Stated Goal: return home, work on [employer] PT Goal Formulation: With patient/family Time For Goal Achievement: 08/21/21 Potential to Achieve Goals: Fair Progress  towards PT goals: Progressing toward goals    Frequency    Min 3X/week      PT Plan Current plan remains appropriate       AM-PAC PT "6 Clicks" Mobility   Outcome Measure  Help needed turning from your back to your side while in a flat bed without using bedrails?: None Help needed moving from lying on your back to sitting on the side of a flat bed without using bedrails?: None Help needed moving to and from a bed to a chair (including a wheelchair)?: A Little Help needed standing up from a chair using your arms (e.g., wheelchair or bedside chair)?: A Little Help needed to walk in hospital room?: A Little Help needed climbing 3-5 steps with a railing? : A Little 6 Click Score: 20    End of Session Equipment Utilized During Treatment: Oxygen Activity Tolerance: Patient tolerated treatment well Patient left: in bed;with call bell/phone within reach;with family/visitor present Nurse Communication: Mobility status PT Visit Diagnosis: Other abnormalities of gait and mobility (R26.89);Difficulty in walking, not elsewhere classified (R26.2)     Time: 8184-0375 PT Time Calculation (min) (ACUTE ONLY): 27 min  Charges:  $Therapeutic Exercise: 23-37 mins                     West Carbo, PT, DPT   Acute Rehabilitation Department Pager #: [phone]   Sandra Cockayne 08/09/2021, 6:00 PM

## 2021-08-10 ENCOUNTER — Inpatient Hospital Stay (HOSPITAL_COMMUNITY): Payer: Self-pay

## 2021-08-10 ENCOUNTER — Encounter (HOSPITAL_COMMUNITY)
Admission: EM | Disposition: A | Discharge status: Home / Self Care | Payer: Self-pay | Source: Home / Self Care | Attending: Internal Medicine

## 2021-08-10 ENCOUNTER — Encounter (HOSPITAL_COMMUNITY): Payer: Self-pay | Admitting: Cardiology

## 2021-08-10 DIAGNOSIS — R0600 Dyspnea, unspecified: Secondary | ICD-10-CM

## 2021-08-10 DIAGNOSIS — D696 Thrombocytopenia, unspecified: Secondary | ICD-10-CM

## 2021-08-10 DIAGNOSIS — I272 Pulmonary hypertension, unspecified: Secondary | ICD-10-CM

## 2021-08-10 HISTORY — PX: RIGHT HEART CATH: CATH118263

## 2021-08-10 LAB — POCT I-STAT EG7
Acid-Base Excess: 21 mmol/L — ABNORMAL HIGH (ref 0.0–2.0)
Acid-Base Excess: 21 mmol/L — ABNORMAL HIGH (ref 0.0–2.0)
Acid-Base Excess: 22 mmol/L — ABNORMAL HIGH (ref 0.0–2.0)
Bicarbonate: 50.5 mmol/L — ABNORMAL HIGH (ref 20.0–28.0)
Bicarbonate: 50.9 mmol/L — ABNORMAL HIGH (ref 20.0–28.0)
Bicarbonate: 51.9 mmol/L — ABNORMAL HIGH (ref 20.0–28.0)
Calcium, Ion: 1.22 mmol/L (ref 1.15–1.40)
Calcium, Ion: 1.26 mmol/L (ref 1.15–1.40)
Calcium, Ion: 1.27 mmol/L (ref 1.15–1.40)
HCT: 37 % (ref 36.0–46.0)
HCT: 38 % (ref 36.0–46.0)
HCT: 38 % (ref 36.0–46.0)
Hemoglobin: 12.6 g/dL (ref 12.0–15.0)
Hemoglobin: 12.9 g/dL (ref 12.0–15.0)
Hemoglobin: 12.9 g/dL (ref 12.0–15.0)
O2 Saturation: 70 %
O2 Saturation: 72 %
O2 Saturation: 77 %
Potassium: 3.6 mmol/L (ref 3.5–5.1)
Potassium: 3.7 mmol/L (ref 3.5–5.1)
Potassium: 3.7 mmol/L (ref 3.5–5.1)
Sodium: 137 mmol/L (ref 135–145)
Sodium: 137 mmol/L (ref 135–145)
Sodium: 139 mmol/L (ref 135–145)
TCO2: >50 mmol/L — ABNORMAL HIGH (ref 22–32)
TCO2: >50 mmol/L — ABNORMAL HIGH (ref 22–32)
TCO2: >50 mmol/L — ABNORMAL HIGH (ref 22–32)
pCO2, Ven: 81.7 mmHg (ref 44–60)
pCO2, Ven: 83.1 mmHg (ref 44–60)
pCO2, Ven: 83.4 mmHg (ref 44–60)
pH, Ven: 7.395 (ref 7.25–7.43)
pH, Ven: 7.399 (ref 7.25–7.43)
pH, Ven: 7.402 (ref 7.25–7.43)
pO2, Ven: 40 mmHg (ref 32–45)
pO2, Ven: 41 mmHg (ref 32–45)
pO2, Ven: 46 mmHg — ABNORMAL HIGH (ref 32–45)

## 2021-08-10 LAB — BASIC METABOLIC PANEL
Anion gap: 9 (ref 5–15)
BUN: 9 mg/dL (ref 6–20)
CO2: 42 mmol/L — ABNORMAL HIGH (ref 22–32)
Calcium: 9.1 mg/dL (ref 8.9–10.3)
Chloride: 87 mmol/L — ABNORMAL LOW (ref 98–111)
Creatinine, Ser: 0.66 mg/dL (ref 0.44–1.00)
GFR, Estimated: >60 mL/min (ref >60)
Glucose, Bld: 95 mg/dL (ref 70–99)
Potassium: 3.6 mmol/L (ref 3.5–5.1)
Sodium: 138 mmol/L (ref 135–145)

## 2021-08-10 LAB — GLUCOSE, CAPILLARY
Glucose-Capillary: 107 mg/dL — ABNORMAL HIGH (ref 70–99)
Glucose-Capillary: 119 mg/dL — ABNORMAL HIGH (ref 70–99)
Glucose-Capillary: 195 mg/dL — ABNORMAL HIGH (ref 70–99)
Glucose-Capillary: 94 mg/dL (ref 70–99)

## 2021-08-10 LAB — CYTOLOGY - NON PAP

## 2021-08-10 SURGERY — RIGHT HEART CATH
Anesthesia: LOCAL

## 2021-08-10 MED ORDER — TECHNETIUM TO 99M ALBUMIN AGGREGATED
4.0000 | Freq: Once | INTRAVENOUS | Status: AC | PRN
  Administered 2021-08-10: 4 via INTRAVENOUS

## 2021-08-10 MED ORDER — HEPARIN (PORCINE) IN NACL 1000-0.9 UT/500ML-% IV SOLN
INTRAVENOUS | Status: DC | PRN
  Administered 2021-08-10: 500 mL

## 2021-08-10 MED ORDER — HEPARIN (PORCINE) IN NACL 1000-0.9 UT/500ML-% IV SOLN
INTRAVENOUS | Status: AC
  Filled 2021-08-10: qty 1000

## 2021-08-10 MED ORDER — INFLUENZA VAC SPLIT QUAD 0.5 ML IM SUSY: 0.5000 mL | PREFILLED_SYRINGE | INTRAMUSCULAR | Status: DC

## 2021-08-10 MED ORDER — UMECLIDINIUM-VILANTEROL 62.5-25 MCG/ACT IN AEPB
1.0000 | INHALATION_SPRAY | Freq: Every day | RESPIRATORY_TRACT | Status: DC
  Administered 2021-08-11 – 2021-08-12 (×2): 1 via RESPIRATORY_TRACT
  Filled 2021-08-10: qty 14

## 2021-08-10 MED ORDER — POTASSIUM CHLORIDE CRYS ER 20 MEQ PO TBCR
40.0000 meq | EXTENDED_RELEASE_TABLET | Freq: Once | ORAL | Status: AC
  Administered 2021-08-10: 40 meq via ORAL
  Filled 2021-08-10: qty 2

## 2021-08-10 MED ORDER — MIDAZOLAM HCL 2 MG/2ML IJ SOLN
INTRAMUSCULAR | Status: DC | PRN
  Administered 2021-08-10: .5 mg via INTRAVENOUS

## 2021-08-10 MED ORDER — FUROSEMIDE 10 MG/ML IJ SOLN: 40.0000 mg | Freq: Two times a day (BID) | INTRAMUSCULAR | Status: DC

## 2021-08-10 MED ORDER — DOXYCYCLINE HYCLATE 100 MG PO TABS
100.0000 mg | ORAL_TABLET | Freq: Two times a day (BID) | ORAL | Status: AC
  Administered 2021-08-10 – 2021-08-12 (×5): 100 mg via ORAL
  Filled 2021-08-10 (×5): qty 1

## 2021-08-10 MED ORDER — FUROSEMIDE 10 MG/ML IJ SOLN
40.0000 mg | Freq: Two times a day (BID) | INTRAMUSCULAR | Status: DC
  Administered 2021-08-10 – 2021-08-12 (×5): 40 mg via INTRAVENOUS
  Filled 2021-08-10 (×5): qty 4

## 2021-08-10 MED ORDER — SODIUM CHLORIDE 0.9 % IV SOLN: 250.0000 mL | INTRAVENOUS | Status: DC | PRN

## 2021-08-10 MED ORDER — SODIUM CHLORIDE 0.9% FLUSH: 3.0000 mL | Freq: Two times a day (BID) | INTRAVENOUS | Status: DC

## 2021-08-10 MED ORDER — SODIUM CHLORIDE 0.9% FLUSH: 3.0000 mL | INTRAVENOUS | Status: DC | PRN

## 2021-08-10 MED ORDER — MIDAZOLAM HCL 2 MG/2ML IJ SOLN
INTRAMUSCULAR | Status: AC
  Filled 2021-08-10: qty 2

## 2021-08-10 MED ORDER — PREDNISONE 20 MG PO TABS
20.0000 mg | ORAL_TABLET | Freq: Every day | ORAL | Status: DC
  Administered 2021-08-11 – 2021-08-12 (×2): 20 mg via ORAL
  Filled 2021-08-10 (×2): qty 1

## 2021-08-10 MED ORDER — LIDOCAINE HCL (PF) 1 % IJ SOLN
INTRAMUSCULAR | Status: AC
  Filled 2021-08-10: qty 30

## 2021-08-10 MED ORDER — SODIUM CHLORIDE 0.9 % IV SOLN: INTRAVENOUS | Status: DC

## 2021-08-10 SURGICAL SUPPLY — 6 items
CATH BALLN WEDGE 5F 110CM (CATHETERS) ×1 IMPLANT
PACK CARDIAC CATHETERIZATION (CUSTOM PROCEDURE TRAY) ×3 IMPLANT
SHEATH GLIDE SLENDER 4/5FR (SHEATH) ×1 IMPLANT
TRANSDUCER W/STOPCOCK (MISCELLANEOUS) ×3 IMPLANT
TUBING ART PRESS 72  MALE/FEM (TUBING) ×2
TUBING ART PRESS 72 MALE/FEM (TUBING) IMPLANT

## 2021-08-10 NOTE — Interval H&P Note (Signed)
History and Physical Interval Note:  08/10/2021 11:00 AM  Plum  has presented today for surgery, with the diagnosis of HF.  The various methods of treatment have been discussed with the patient and family. After consideration of risks, benefits and other options for treatment, the patient has consented to  Procedure(s): RIGHT HEART CATH (N/A) as a surgical intervention.  The patient's history has been reviewed, patient examined, no change in status, stable for surgery.  I have reviewed the patient's chart and labs.  Questions were answered to the patient's satisfaction.     Kylen Schliep [employer]

## 2021-08-10 NOTE — Progress Notes (Signed)
Attempted to wean patient oxygen down, went down to as low as 3L from 6L, tolerated well for a whilewithsats up to 94%, but than sats dropped down to the 70s, requiring increased, now back up to 5L, sats 90%.

## 2021-08-10 NOTE — Assessment & Plan Note (Addendum)
-  present on lab work on 8/16 and thus not a new finding - it is mild-follow intermittently

## 2021-08-10 NOTE — H&P (View-Only) (Signed)
° °Progress Note ° °Patient Name: Pamela Ho °Date of Encounter: 08/10/2021 ° °Primary Cardiologist: None  ° °Subjective  ° °Patient seen examined her bedside.  Husband is by the bedside.  She is aware of her right heart catheterization today. ° °Inpatient Medications  °  °Scheduled Meds: ° [MAR Hold] enoxaparin (LOVENOX) injection  40 mg Subcutaneous Q24H  ° [MAR Hold] fluticasone  1-2 spray Each Nare Daily  ° [MAR Hold] furosemide  40 mg Oral BID  ° [MAR Hold] insulin aspart  0-5 Units Subcutaneous QHS  ° [MAR Hold] insulin aspart  0-9 Units Subcutaneous TID WC  ° [MAR Hold] metoprolol succinate  50 mg Oral Daily  ° [MAR Hold] pantoprazole  40 mg Oral Daily  ° [MAR Hold] pravastatin  20 mg Oral Daily  ° [MAR Hold] sertraline  150 mg Oral Daily  ° sodium chloride flush  3 mL Intravenous Q12H  ° [MAR Hold] spironolactone  25 mg Oral Daily  ° °Continuous Infusions: ° sodium chloride    ° sodium chloride 10 mL/hr at 08/10/21 0950  ° °PRN Meds: °sodium chloride, [MAR Hold] acetaminophen **OR** [MAR Hold] acetaminophen, [MAR Hold] albuterol, [MAR Hold] clonazePAM, [MAR Hold] guaiFENesin, Heparin (Porcine) in NaCl, [MAR Hold] hydrALAZINE, [MAR Hold] lactulose, lidocaine (PF), midazolam, [MAR Hold] ondansetron **OR** [MAR Hold] ondansetron (ZOFRAN) IV, [MAR Hold] phenylephrine, [MAR Hold] polyethylene glycol, sodium chloride flush  ° °Vital Signs  °  °Vitals:  ° 08/09/21 2100 08/09/21 2133 08/10/21 0416 08/10/21 0921  °BP:  113/62 126/67   °Pulse: 69 74 68   °Resp: (!) 22 20 (!) 21   °Temp: 98.6 °F (37 °C) 98.5 °F (36.9 °C) 98.5 °F (36.9 °C)   °TempSrc: Oral  Oral   °SpO2:   98%   °Weight:   85.3 kg 85.3 kg  °Height:      ° ° °Intake/Output Summary (Last 24 hours) at 08/10/2021 1047 °Last data filed at 08/10/2021 0727 °Gross per 24 hour  °Intake 0 ml  °Output 2850 ml  °Net -2850 ml  ° °Filed Weights  ° 08/09/21 0507 08/10/21 0416 08/10/21 0921  °Weight: 87.6 kg 85.3 kg 85.3 kg  ° ° °Telemetry  °  °Sinus rhythm-  Personally Reviewed ° °ECG  °  °None today- Personally Reviewed ° °Physical Exam  °  °General: Comfortable, sitting up in bed °Head: Atraumatic, normal size  °Eyes: PEERLA, EOMI  °Neck: Supple, normal JVD °Cardiac: Normal S1, S2; RRR; no murmurs, rubs, or gallops °Lungs: Clear to auscultation bilaterally °Abd: Soft, nontender, no hepatomegaly  °Ext: warm, no edema °Musculoskeletal: No deformities, BUE and BLE strength normal and equal °Skin: Warm and dry, no rashes   °Neuro: Alert and oriented to person, place, time, and situation, CNII-XII grossly intact, no focal deficits  °Psych: Normal mood and affect  ° °Labs  °  °Chemistry °Recent Labs  °Lab 08/05/21 °2054 08/05/21 °2101 08/06/21 °0415 08/07/21 °0501 08/08/21 °0500 08/09/21 °0455 08/10/21 °0734  °NA 134*   < > 134*   < > 139 140 138  °K 2.8*   < > 3.0*   < > 3.4* 3.2* 3.6  °CL 91*  --  91*   < > 89* 86* 87*  °CO2 31  --  32   < > 42* 45* 42*  °GLUCOSE 193*  --  346*   < > 159* 109* 95  °BUN 36*  --  35*   < > 18 12 9  °CREATININE 1.33*  --  1.31*   < >   0.92 0.74 0.66  °CALCIUM 9.5  --  9.4   < > 9.8 9.5 9.1  °PROT 6.2*  --  5.8*  --   --   --   --   °ALBUMIN 2.7*  --  2.5*  --   --   --   --   °AST 23  --  21  --   --   --   --   °ALT 33  --  29  --   --   --   --   °ALKPHOS 86  --  77  --   --   --   --   °BILITOT 3.0*  --  2.2*  --   --   --   --   °GFRNONAA 48*  --  49*   < > >60 >60 >60  °ANIONGAP 12  --  11   < > 8 9 9  ° < > = values in this interval not displayed.  °  ° °Hematology °Recent Labs  °Lab 08/07/21 °0501 08/08/21 °0500 08/09/21 °0455  °WBC 6.5 5.8 5.9  °RBC 5.24* 5.07 4.84  °HGB 10.8* 10.7* 10.0*  °HCT 42.6 40.6 39.4  °MCV 81.3 80.1 81.4  °MCH 20.6* 21.1* 20.7*  °MCHC 25.4* 26.4* 25.4*  °RDW 22.7* 22.3* 21.8*  °PLT 101* 90* 95*  ° ° °Cardiac EnzymesNo results for input(s): TROPONINI in the last 168 hours. No results for input(s): TROPIPOC in the last 168 hours.  ° °BNP °Recent Labs  °Lab 08/05/21 °2054  °BNP 550.9*  °  ° °DDimer No results  for input(s): DDIMER in the last 168 hours.  ° °Radiology  °  °DG Chest 1 View ° °Result Date: 08/09/2021 °CLINICAL DATA:  Status post right thoracentesis. EXAM: CHEST  1 VIEW COMPARISON:  08/05/2021. FINDINGS: 0953 hours. Right base collapse/consolidation with small to moderate right pleural effusion, new in the interval. No evidence for pneumothorax. Cardiopericardial silhouette is at upper limits of normal for size. Interstitial markings are diffusely coarsened. IMPRESSION: 1. New right base collapse/consolidation with small to moderate right pleural effusion. 2. No pneumothorax. Electronically Signed   By: Eric  Mansell M.D.   On: 08/09/2021 10:02  ° °CT CHEST W CONTRAST ° °Result Date: 08/08/2021 °CLINICAL DATA:  Chronic dyspnea of uncertain etiology, history COPD EXAM: CT CHEST WITH CONTRAST TECHNIQUE: Multidetector CT imaging of the chest was performed during intravenous contrast administration. Imaging performed with patient in RIGHT lateral decubitus position. RADIATION DOSE REDUCTION: This exam was performed according to the departmental dose-optimization program which includes automated exposure control, adjustment of the mA and/or kV according to patient size and/or use of iterative reconstruction technique. CONTRAST:  80mL OMNIPAQUE IOHEXOL 350 MG/ML SOLN IV COMPARISON:  02/08/2015 FINDINGS: Cardiovascular: Atherosclerotic calcifications aorta and coronary arteries. Aorta normal caliber. Heart unremarkable. No pericardial effusion. Mediastinum/Nodes: Enlarged RIGHT paratracheal lymph node 12 mm image 55. Additional scattered normal sized mediastinal nodes. Base of cervical region normal appearance. Esophagus unremarkable. Lungs/Pleura: Moderate to large RIGHT pleural effusion. Significant atelectasis of RIGHT lung with complete atelectasis of RIGHT middle and RIGHT lower lobes and partial atelectasis of RIGHT upper lobe. Scattered infiltrate RIGHT upper lobe. LEFT lung clear. No pneumothorax. Upper  Abdomen: Splenomegaly, spleen 21.0 x 5.8 x 16.0 cm (volume = 1000 cm^3). Fatty infiltration of liver. Hypervascular focus posterior RIGHT lobe liver 17 x 11 mm image 140, nonspecific. 2.1 x 1.9 cm LEFT adrenal nodule measuring 44 Hounsfield units. Remaining visualized upper abdomen unremarkable. Musculoskeletal: No osseous   abnormalities. IMPRESSION: Moderate to large RIGHT pleural effusion with complete atelectasis of RIGHT middle and RIGHT lower lobes and partial atelectasis of RIGHT upper lobe. Scattered infiltrate in RIGHT upper lobe. Nonspecific enlarged RIGHT paratracheal lymph node 12 mm short axis. Splenomegaly. 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 Recommend adrenal washout CT or chemical shift MRI. JACR 2017 Aug; 14(8):1038-44, JCAT 2016 Mar-Apr; 40(2):194-200, Urol J 2006 Spring; 3(2):71-4. Fatty infiltration of liver with nonspecific hypervascular focus posterior RIGHT lobe liver 17 x 11 mm, could represent a flash filling hemangioma or other vascular lesion, can be assessed at time of LEFT adrenal mass evaluation. Aortic Atherosclerosis (ICD10-I70.0). Electronically Signed   By: Lavonia Dana M.D.   On: 08/08/2021 15:24   IR THORACENTESIS ASP PLEURAL SPACE W/IMG GUIDE  Result Date: 08/09/2021 INDICATION: Chronic dyspnea of uncertain etiology. Right pleural effusion. Request for diagnostic and therapeutic thoracentesis. EXAM: ULTRASOUND GUIDED RIGHT THORACENTESIS MEDICATIONS: 1% lidocaine 10 mL COMPLICATIONS: None immediate. PROCEDURE: An ultrasound guided thoracentesis was thoroughly discussed with the patient and questions answered. The benefits, risks, alternatives and complications were also discussed. The patient understands and wishes to proceed with the procedure. Written consent was obtained. Ultrasound was performed to localize and mark an adequate pocket of fluid in the right chest. The area was then prepped and draped in the normal sterile fashion. 1% Lidocaine was used  for local anesthesia. Under ultrasound guidance a 6 Fr Safe-T-Centesis catheter was introduced. Thoracentesis was performed. The catheter was removed and a dressing applied. FINDINGS: A total of approximately 1.3 L of clear yellow fluid was removed. Samples were sent to the laboratory as requested by the clinical team. IMPRESSION: Successful ultrasound guided right thoracentesis yielding 1.3 L of pleural fluid. Procedure performed by: Gareth Eagle, PA-C Electronically Signed   By: Miachel Roux M.D.   On: 08/09/2021 13:30    Cardiac Studies   TTE 08/06/2021 IMPRESSIONS   1. Left ventricular ejection fraction, by estimation, is 60 to 65%. The left ventricle has normal function. The left ventricle has no regional wall motion abnormalities. There is mild left ventricular hypertrophy.  Left ventricular diastolic parameters are consistent with Grade II diastolic dysfunction (pseudonormalization).  Elevated left atrial pressure. There is the interventricular septum is flattened in systole and diastole, consistent with right ventricular pressure and volume overload.   2. Right ventricular systolic function is moderately reduced. The right ventricular size is moderately enlarged. There is moderately elevated pulmonary artery systolic pressure. The estimated right ventricular systolic pressure is 18.5 mmHg.   3. Right atrial size was severely dilated.   4. The mitral valve is normal in structure. No evidence of mitral valve regurgitation. No evidence of mitral stenosis.   5. Tricuspid valve regurgitation is mild to moderate.   6. The aortic valve is tricuspid. Aortic valve regurgitation is trivial.No aortic stenosis is present.   7. The inferior vena cava is dilated in size with >50% respiratory variability, suggesting right atrial pressure of 8 mmHg.   FINDINGS   Left Ventricle: Left ventricular ejection fraction, by estimation, is 60 to 65%. The left ventricle has normal function. The left ventricle has no  regional wall motion abnormalities. The left ventricular internal cavity size was normal in size. There is   mild left ventricular hypertrophy. The interventricular septum is flattened in systole and diastole, consistent with right ventricular  pressure and volume overload. Left ventricular diastolic parameters are consistent with Grade II diastolic dysfunction   (pseudonormalization). Elevated left atrial pressure.  Right Ventricle: The right ventricular size is moderately enlarged. Right ventricular wall thickness was not well visualized. Right ventricular systolic function is moderately reduced. There is moderately elevated pulmonary artery systolic pressure. The tricuspid regurgitant velocity is 3.57 m/s, and with an assumed right atrial pressure of 8 mmHg, the estimated right ventricular systolic pressure is 59.2 mmHg.   Left Atrium: Left atrial size was normal in size.   Right Atrium: Right atrial size was severely dilated.   Pericardium: Trivial pericardial effusion is present.   Mitral Valve: The mitral valve is normal in structure. No evidence of mitral valve regurgitation. No evidence of mitral valve stenosis.   Tricuspid Valve: The tricuspid valve is normal in structure. Tricuspid valve regurgitation is mild to moderate.   Aortic Valve: The aortic valve is tricuspid. Aortic valve regurgitation is trivial. No aortic stenosis is present. Aortic valve peak gradient measures 8.2 mmHg.   Pulmonic Valve: The pulmonic valve was not well visualized. Pulmonic valve regurgitation is trivial.   Aorta: The aortic root and ascending aorta are structurally normal, with no evidence of dilitation.   Venous: The inferior vena cava is dilated in size with greater than 50% respiratory variability, suggesting right atrial pressure of 8 mmHg.   IAS/Shunts: The interatrial septum was not well visualized.   Patient Profile     51 y.o. female history of obesity, hypertension, chronic smoking as well  as GERD.  Mated for acute respiratory failure found to have right ventricular dysfunction with pulmonary hypertension  Assessment & Plan    Acute hypoxic respiratory failure Shortness of breath Right ventricular dysfunction with pulmonary hypertension Mild to moderate tricuspid regurgitation COPD Liver cirrhosis  Plans for right heart catheterization today.  Noted pulmonary hypertension on TTE with pulmonary pressures of 59 mmHg-suspect pulmonary hypertension is multifactorial Group 1 with liver cirrhosis as well as group 3 COPD.  We will get more information from her right heart catheterization about her pulmonary artery systolic pressure as well as her LVEDP. This will help guide [place] with treatment as well.  The patient understands that risks include but are not limited to stroke (1 in 1000), death (1 in 20), kidney failure [usually temporary] (1 in 500), bleeding (1 in 200), allergic reaction [possibly serious] (1 in 200), and agrees to proceed.  In terms of her pulmonary hypertension I will defer to our advanced heart failure team for treatment.  Suspect she may benefit from phosphodiesterase inhibitors but will defer to the advanced heart failure team.  COPD see management of primary team.    For questions or updates, please contact [facility] Please consult [email or web address] for contact info under Cardiology/STEMI.      Signed, Berniece Salines, DO  08/10/2021, 10:47 AM

## 2021-08-10 NOTE — TOC Initial Note (Signed)
Transition of Care [facility]) - Initial/Assessment Note    Patient Details  Name: Pamela Ho MRN: [id number] Date of Birth: 1970-07-02  Transition of Care [facility]) CM/SW Contact:    Erenest Rasher, RN Phone Number: [phone] 08/10/2021, 4:40 PM  Clinical Narrative:                 HF TOC CM spoke to pt and husband. States she has PCP, Dr Albertina Senegal. She does not have insurance. Will send referral to [employer] for assistance with Medicaid and SS disability. Will use MATCH/HF funds for medications at dc.   Expected Discharge Plan: Home/Self Care Barriers to Discharge: Continued Medical Work up   Patient Goals and CMS Choice Patient states their goals for this hospitalization and ongoing recovery are:: wants to get back to feeling better CMS Medicare.gov Compare Post Acute Care list provided to:: Patient    Expected Discharge Plan and Services Expected Discharge Plan: Home/Self Care   Discharge Planning Services: CM Consult, [place] Program, Medication Assistance                                          Prior Living Arrangements/Services   Lives with:: Spouse   Do you feel safe going back to the place where you live?: Yes      Need for Family Participation in Patient Care: Yes (Comment) Care giver support system in place?: Yes (comment)   Criminal Activity/Legal Involvement Pertinent to Current Situation/Hospitalization: No - Comment as needed  Activities of Daily Living      Permission Sought/Granted Permission sought to share information with : Case Manager, Family Supports, PCP                Emotional Assessment       Orientation: : Oriented to Self, Oriented to Place, Oriented to  Time, Oriented to Situation   Psych Involvement: No (comment)  Admission diagnosis:  Acute congestive heart failure (HCC) [I50.9] Abnormal LFTs [R79.89] Ascites [R18.8] Acute congestive heart failure, unspecified heart failure type ([place]) [I50.9] Acute CHF  (congestive heart failure) ([place]) [I50.9] Patient Active Problem List   Diagnosis Date Noted   Thrombocytopenia ([place]) 08/10/2021   Adrenal mass ([place]) 08/09/2021   Pleural effusion on right 08/09/2021   Acute respiratory failure with hypoxia ([place]) 08/07/2021   AKI (acute kidney injury) ([place]) 08/07/2021   Glucose intolerance 08/07/2021   Cirrhosis of liver ([place]) 08/06/2021   Generalized anxiety disorder 08/06/2021   GERD without esophagitis 08/06/2021   Hypokalemia 08/06/2021   Acute on chronic diastolic CHF (congestive heart failure) ([place]) 08/06/2021   Essential hypertension 08/05/2021   Elevated troponin level not due myocardial infarction 08/05/2021   Nicotine dependence, cigarettes, uncomplicated 82/95/6213   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   Hypertensive urgency 02/08/2015   Headache 02/08/2015   PCP:  Rupert Stacks, MD Pharmacy:   [facility] [address], [place] [address] Phone: [phone] Fax: [phone]  [facility] [address] [place] [address] Phone: [phone] Fax: [phone]     Social Determinants of Health (SDOH) Interventions Food Insecurity Interventions: Intervention Not Indicated Financial Strain Interventions: Other (Comment) (HF CSW consulted) Housing Interventions: Intervention Not Indicated Transportation Interventions: Intervention Not Indicated  Readmission Risk Interventions No flowsheet data found.

## 2021-08-10 NOTE — Consult Note (Signed)
NAME:  Pamela Ho, MRN:  [id number], DOB:  [birth year], LOS: 4 ADMISSION DATE:  08/05/2021, CONSULTATION DATE:  2/17 REFERRING MD:  Marigene Ehlers, CHIEF COMPLAINT:  Dyspnea   History of Present Illness:  51 y/o female with a history of cigarette smoking presented with several weeks of dyspnea and swelling.  She was admitted by the hospitalist service on 2/12 with a working diagnosis of CHF and treated with diuretics.  An echocardiogram showed pulmonary hypertension so she underwent a right heart catheterization today which showed: RA mean 12 RV 77/15 PA 778/28, mean 46 PCWP mean 15  Oxygen saturations: SVC 77% PA 71% AO 99%  Cardiac Output (Fick) 8.27  Cardiac Index (Fick) 4.45 PVR 3.7 WU  She has splenomegaly which dates back to 2016 on imaging.  In addition she has a cirrhotic appearing liver.  After her hosptialization here she was given IV lasix but developed a moderate to large pleural effusion.  This was removed by thoracentesis on 2/16 where 1.3 L of clear fluid was removed, though there were > 2K WBC and the LDH was elevated the fluid appears to be consistent with a transudate.   She tells me that she has smoked 1-1/2 packs of cigarettes daily ever since age 49.  She does not drink alcohol.  She has been told in the past that she has chronic bronchitis but she has never had lung function testing.  Her primary care physician also said she had COPD.  She has never been treated for a COPD exacerbation that she is aware of.  She does bring up mucus on a daily basis but this is increased significantly over the last several days.  Her daughter says that she snores but she has never been tested for sleep apnea.  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  Pertinent  Medical History  Cigarette smoker COPD by history but no lung function testing available Hypertension Hyperlipidemia Obesity Depression anxiety Cirrhosis, new diagnosis Acute  congestive heart failure, new diagnosis Pulmonary hypertension, new diagnosis  Significant Hospital Events: Including procedures, antibiotic start and stop dates in addition to other pertinent events   2/13 admission to hospitalist service for heart failure exacerbation 2/17 Right heart catheterization, PCCM consult  Procedures: 2/13 Echo> mild LVH, grade 2 diastolic dysfunction elevated LA pressure, RV moderately enlarged, moderate reduction in RV function, RVSP 59 mm Hg, RA severely dilated 2/15 CT chest > performed in R lateral decubitus position, moderate to large R pleural effusion with complete atelectasis of RML and RLL, partial collapse RUL; Radiology mentions infiltrate RUL but this is hard to distinguish from infiltrate, mild centrilobular emphysema noted in the left lung, mediastinal adenopathy 2/17 RHC 1. Borderline elevated PCWP.  2. Severe pulmonary arterial  ypertension.  3. Elevated RA pressure.  4. High cardiac output.  5. PVR 3.7.  Not markedly high likely due to high cardiac output, possible that cirrhosis  contributes  Interim History / Subjective:  As above  Objective   Blood pressure (!) 101/50, pulse 69, temperature 98.5 F (36.9 C), temperature source Oral, resp. rate 20, height _0  (1.575 m), weight 85.3 kg, SpO2 96 %.        Intake/Output Summary (Last 24 hours) at 08/10/2021 1151 Last data filed at 08/10/2021 0727 Gross per 24 hour  Intake 0 ml  Output 2850 ml  Net -2850 ml   Filed Weights   08/09/21 0507 08/10/21 0416 08/10/21 0921  Weight: 87.6 kg 85.3  kg 85.3 kg   Currently on 7 L nasal cannula  Examination:  General:  Chronically ill appearing resting comfortably in chair HENT: NCAT OP clear PULM: Wheezing left lung, diminished R base, normal effort CV: RRR, no mgr GI: BS+, soft, nontender MSK: normal bulk and tone Derm: multiple telangiectasias over skin, particularly face/neck. Neuro: awake, alert, no distress, MAEW  Resolved Hospital  Problem list     Assessment & Plan:  Acute hypoxemic respiratory failure Right pleural effusion, transudate Cirrhosis with ascites, etiology uncertain Splenomegaly Acute bronchitis Centrilobular emphysema Borderline mediastinal adenopathy Snoring, possible obstructive sleep apnea Multiple telangiectasias Obesity  Discussion: She has pulmonary hypertension and given the fact that she has chronic bronchitis and centrilobular emphysema and with a significant smoking history I think it is very likely she has a Lexicographer group 3 component to her disease.  She may also have underlying obstructive sleep apnea.  Her physical exam is a bit unusual and that she has multiple telangiectasias, probably reasonable to perform serologic testing for scleroderma.  Consider HHT.  She should also have alpha-1 antitrypsin testing given the centrilobular emphysema and cirrhosis.  From my standpoint I think the best approach moving forward is further diuresis, adding treatment for presumed COPD, obtaining outpatient sleep study, outpatient pulmonary function testing.  I do not think she would benefit from a pulmonary vasodilator at this time.  Plan: Start Anoro Prednisone 20 mg daily x5 days Doxycycline 100 mg twice daily x5 days Wean off oxygen to maintain O2 saturation greater than 88% Plan outpatient sleep study Plan outpatient pulmonary function testing Would continue diuresis as guided by the advanced heart failure team/cardiology Scleroderma antibody Alpha-1 antitrypsin testing  We will follow, available as needed over weekend  Best Practice (right click and "Reselect all SmartList Selections" daily)   Per TR[facility]  Labs   CBC: Recent Labs  Lab 08/05/21 2054 08/05/21 2101 08/07/21 0501 08/08/21 0500 08/09/21 0455  WBC 4.4  --  6.5 5.8 5.9  NEUTROABS 3.2  --  5.5 4.5 4.6  HGB 11.5* 14.3 10.8* 10.7* 10.0*  HCT 43.3 42.0 42.6 40.6 39.4  MCV 79.3*  --  81.3 80.1 81.4  PLT 110*   --  101* 90* 95*    Basic Metabolic Panel: Recent Labs  Lab 08/06/21 0415 08/07/21 0501 08/08/21 0500 08/09/21 0455 08/10/21 0734  NA 134* 139 139 140 138  K 3.0* 2.6* 3.4* 3.2* 3.6  CL 91* 88* 89* 86* 87*  CO2 32 41* 42* 45* 42*  GLUCOSE 346* 114* 159* 109* 95  BUN 35* 24* _0 CREATININE 1.31* 1.04* 0.92 0.74 0.66  CALCIUM 9.4 10.1 9.8 9.5 9.1  MG 1.8  --  1.6*  --   --    GFR: Estimated Creatinine Clearance: 83.4 mL/min (by C-G formula based on SCr of 0.66 mg/dL). Recent Labs  Lab 08/05/21 2054 08/07/21 0501 08/08/21 0500 08/09/21 0455  WBC 4.4 6.5 5.8 5.9    Liver Function Tests: Recent Labs  Lab 08/05/21 2054 08/06/21 0415  AST 23 21  ALT 33 29  ALKPHOS 86 77  BILITOT 3.0* 2.2*  PROT 6.2* 5.8*  ALBUMIN 2.7* 2.5*   No results for input(s): LIPASE, AMYLASE in the last 168 hours. Recent Labs  Lab 08/06/21 0415 08/07/21 0501  AMMONIA 51* 40*    ABG    Component Value Date/Time   HCO3 35.8 (H) 08/05/2021 2101   TCO2 37 (H) 08/05/2021 2101   O2SAT 74.0 08/05/2021 2101  Coagulation Profile: Recent Labs  Lab 08/06/21 0415  INR SPECIMEN CLOTTED    Cardiac Enzymes: No results for input(s): CKTOTAL, CKMB, CKMBINDEX, TROPONINI in the last 168 hours.  HbA1C: Hgb A1c MFr Bld  Date/Time Value Ref Range Status  08/06/2021 02:05 PM 5.5 4.8 - 5.6 % Final    Comment:    (NOTE) Pre diabetes:          5.7%-6.4%  Diabetes:              >6.4%  Glycemic control for   <7.0% adults with diabetes     CBG: Recent Labs  Lab 08/09/21 1059 08/09/21 1554 08/09/21 2105 08/10/21 0602 08/10/21 1128  GLUCAP 142* 193* 108* 107* 119*    Review of Systems:   Gen: Denies fever, chills, weight change, fatigue, night sweats HEENT: Denies blurred vision, double vision, hearing loss, tinnitus, sinus congestion, rhinorrhea, sore throat, neck stiffness, dysphagia PULM:per HPI CV: per HPI GI: Denies abdominal pain, nausea, vomiting, diarrhea,  hematochezia, melena, constipation, change in bowel habits GU: Denies dysuria, hematuria, polyuria, oliguria, urethral discharge Endocrine: Denies hot or cold intolerance, polyuria, polyphagia or appetite change Derm: Denies rash, dry skin, scaling or peeling skin change Heme: Denies easy bruising, bleeding, bleeding gums Neuro: Denies headache, numbness, weakness, slurred speech, loss of memory or consciousness   Past Medical History:  She,  has a past medical history of Acute congestive heart failure ([place]) (08/05/2021), Anemia, Anxiety, COPD (chronic obstructive pulmonary disease) ([place]), Depression, GERD (gastroesophageal reflux disease), Heart murmur, and Hypertension.   Surgical History:   Past Surgical History:  Procedure Laterality Date   CHOLECYSTECTOMY     cyst from neck     IR THORACENTESIS ASP PLEURAL SPACE W/IMG GUIDE  08/09/2021     Social History:   reports that she has been smoking cigarettes. She has a 45.00 pack-year smoking history. She has never used smokeless tobacco. She reports that she does not drink alcohol and does not use drugs.   Family History:  Her family history includes Cancer in her father and paternal grandmother; Diabetes in her father, mother, and paternal grandmother; Heart disease in her father, maternal grandfather, maternal grandmother, mother, and paternal grandmother; Hyperlipidemia in her father, maternal grandfather, maternal grandmother, mother, and paternal grandmother; Hypertension in her father, maternal grandfather, maternal grandmother, and paternal grandmother; Stroke in her paternal grandmother. There is no history of Breast cancer.   Allergies Allergies  Allergen Reactions   Lexapro [Escitalopram]     "Made me crazy"    Lisinopril     "Angioedema"     Home Medications  Prior to Admission medications   Medication Sig Start Date End Date Taking? Authorizing Provider  acetaminophen (TYLENOL) 500 MG tablet Take 500 mg by mouth every 6  (six) hours as needed for mild pain.   Yes [provider]  chlorthalidone (HYGROTON) 25 MG tablet Take 25 mg by mouth daily. 07/22/21  Yes [provider]  cholecalciferol (VITAMIN D3) 25 MCG (1000 UNIT) tablet Take 1,000 Units by mouth daily.   Yes [provider]  clonazePAM (KLONOPIN) 0.5 MG tablet Take 0.5 mg by mouth 2 (two) times daily as needed for anxiety.   Yes [provider]  guaiFENesin (MUCINEX) 600 MG 12 hr tablet Take 600 mg by mouth 2 (two) times daily as needed for cough.   Yes [provider]  LEXAPRO 10 MG tablet Take 20 mg by mouth daily. 07/16/21  Yes [provider]  metoprolol succinate (TOPROL-XL)  50 MG 24 hr tablet Take 50 mg by mouth daily. 07/20/21  Yes [provider]  omeprazole (PRILOSEC) 20 MG capsule Take 20 mg by mouth daily.   Yes [provider]  pravastatin (PRAVACHOL) 20 MG tablet Take 20 mg by mouth daily.   Yes [provider]  sertraline (ZOLOFT) 100 MG tablet Take 150 mg by mouth daily.   Yes [provider]  spironolactone (ALDACTONE) 25 MG tablet Take 1 tablet (25 mg total) by mouth daily. 02/09/15  Yes Verlee Monte, MD  IRON PO Take 1 tablet by mouth daily.  Patient not taking: Reported on 08/05/2021    [provider]  lisinopril (PRINIVIL,ZESTRIL) 10 MG tablet Take 1 tablet (10 mg total) by mouth daily. Patient not taking: Reported on 08/05/2021 02/09/15   Verlee Monte, MD  metoprolol (LOPRESSOR) 50 MG tablet Take 1 tablet (50 mg total) by mouth 2 (two) times daily. Patient not taking: Reported on 08/05/2021 02/09/15   Verlee Monte, MD     Critical care time: n/a     Roselie Awkward, MD [place] [facility] Pager: [phone] Cell: [phone] After 7:00 pm call Elink  [phone]

## 2021-08-10 NOTE — Progress Notes (Signed)
[facility] Progress Note  Patient: Pamela Ho     FAO:[id number]  DOA: 08/05/2021      Date of Service: the patient was seen and examined on 08/10/2021  Brief hospital course: This is a 51 year old female with COPD, anxiety, hypertension, nicotine abuse who presented to the hospital for acute on chronic shortness of breath, orthopnea, lower extremity edema and was felt to have acute congestive heart failure and diuresed with IV Lasix.    2D echo reveals an EF of 60 to 69%, grade 2 diastolic dysfunction and an RVSP of 59.  On 2/15, she underwent a CT without contrast of the chest which revealed a large right-sided pleural effusion.  On 2/16, approximately 1.3 L of fluid was removed from the right pleural space. Aafter she was felt to be adequately diuresed, cardiology was consulted for possible right heart cath.  Subjective:  She feels anxious about her cardiac cath today and has no other complaints.  Assessment and Plan: Assessment and Plan: Acute on chronic diastolic CHF (congestive heart failure) ([place]) Essential hypertension -Patient presented with several months of progressive shortness of breath, cough, abdominal distention, bilateral pedal edema, orthopnea. -Elevated BNP, chest x-ray with pulm edema -Echocardiogram with EF 60 to 65%, G2 DD, and RVSP 59  -Also continued on home meds that include metoprolol succinate 50 mg daily and Aldactone 25 mg daily. -Net IO Since Admission: -6,820.16 mL [08/09/21 1318] -Adequately diuresed  Acute respiratory failure with hypoxia ([place])- (present on admission) - Multifactorial secondary to pulmonary edema and right pleural effusion and possibly underlying COPD as she is a lifelong smoker - She remains on 6 L of oxygen and chest x-ray reveals that the right lung is still collapsed-have ordered incentive spirometry and encourage ambulation -Have asked RN to wean oxygen and also obtain an ambulatory pulse ox  AKI (acute kidney  injury) ([place])- (present on admission) -Baseline creatinine not available.  Presented with creatinine elevated to 1.33.  Improving renal function with decongestion. Recent Labs    08/05/21 2054 08/06/21 0415 08/07/21 0501 08/08/21 0500 08/09/21 0455  BUN 36* 35* 24* 18 12  CREATININE 1.33* 1.31* 1.04* 0.92 0.74     Thrombocytopenia (HCC) - This appears new during this admission -  it is mild-follow intermittently  Adrenal mass ([place]) -CT scan from 2/15 showed an 2.1 cm left adrenal mass, 44 HU, probable benign adenoma. -Radiologist recommended adrenal washout CT or chemical shift MRI.  -Consider imaging as outpatient  Glucose intolerance- (present on admission) -History of diabetes mellitus.  A1c 5.5.  Blood sugar level was initially elevated because of stress.  Currently running consistently less than 200. Recent Labs  Lab 08/08/21 1150 08/08/21 1612 08/08/21 2056 08/09/21 0420 08/09/21 1059  GLUCAP 156* 185* 143* 127* 142*    Hypokalemia- (present on admission) Hypomagnesemia   - Continue to replace as needed  GERD without esophagitis- (present on admission) Continuing PPI daily  Generalized anxiety disorder- (present on admission) -Continuing home regimen of as needed clonazepam -Continue home regimen of Zoloft  Cirrhosis of liver ([place])- (present on admission) -It seems patient has a history of cirrhosis since 2016.    -Unclear etiology.  Denies alcohol use.  On admission, hepatitis panel and serologic work-up was sent.  Acute hepatitis panel negative. AMA/ASMA/ANA negative.  - ? If congestive hepatopathy from right heart failure vs NASH    Nicotine dependence, cigarettes, uncomplicated- (present on admission) - Patient has been counseled to stop smoking  Elevated troponin level not due myocardial  infarction- (present on admission) -Elevated troponin level likely due to LV strain.  Downtrending.         DVT prophylaxis:  Code Status:   Code Status:  Full Code  Level of Care: Level of care: Telemetry Cardiac Disposition Plan:  Status is: Inpatient Remains inpatient appropriate because: Need to follow-up on heart cath and hypoxia          Objective:   Vitals:   08/10/21 1110 08/10/21 1115 08/10/21 1120 08/10/21 1132  BP: 95/82 121/68 121/68 (!) 101/50  Pulse: 78 79 (!) 0 69  Resp: _0 Temp:    98.5 F (36.9 C)  TempSrc:    Oral  SpO2: 99% 99%  96%  Weight:      Height:       Filed Weights   08/09/21 0507 08/10/21 0416 08/10/21 0921  Weight: 87.6 kg 85.3 kg 85.3 kg   Exam: General exam: Appears comfortable  HEENT: PERRLA, oral mucosa moist, no sclera icterus or thrush Respiratory system: Poor breath sounds in the right lower lung field Cardiovascular system: S1 & S2 heard, regular rate and rhythm Gastrointestinal system: Abdomen soft, non-tender, nondistended. Normal bowel sounds   Central nervous system: Alert and oriented. No focal neurological deficits. Extremities: No cyanosis, clubbing or edema Skin: No rashes or ulcers Psychiatry: Anxious  Imaging and lab data Reviewed  CBC: Recent Labs  Lab 08/05/21 2054 08/05/21 2101 08/07/21 0501 08/08/21 0500 08/09/21 0455  WBC 4.4  --  6.5 5.8 5.9  NEUTROABS 3.2  --  5.5 4.5 4.6  HGB 11.5* 14.3 10.8* 10.7* 10.0*  HCT 43.3 42.0 42.6 40.6 39.4  MCV 79.3*  --  81.3 80.1 81.4  PLT 110*  --  101* 90* 95*   Basic Metabolic Panel: Recent Labs  Lab 08/06/21 0415 08/07/21 0501 08/08/21 0500 08/09/21 0455 08/10/21 0734  NA 134* 139 139 140 138  K 3.0* 2.6* 3.4* 3.2* 3.6  CL 91* 88* 89* 86* 87*  CO2 32 41* 42* 45* 42*  GLUCOSE 346* 114* 159* 109* 95  BUN 35* 24* _1 CREATININE 1.31* 1.04* 0.92 0.74 0.66  CALCIUM 9.4 10.1 9.8 9.5 9.1  MG 1.8  --  1.6*  --   --    GFR: Estimated Creatinine Clearance: 83.4 mL/min (by C-G formula based on SCr of 0.66 mg/dL).    Author: Debbe Odea  08/10/2021 11:56 AM She feels anxious about her cardiac  cath today.  She has no other complaints

## 2021-08-10 NOTE — Progress Notes (Signed)
Progress Note  Patient Name: Pamela Ho Date of Encounter: 08/10/2021  Primary Cardiologist: None   Subjective   Patient seen examined her bedside.  Husband is by the bedside.  She is aware of her right heart catheterization today.  Inpatient Medications    Scheduled Meds:  [MAR Hold] enoxaparin (LOVENOX) injection  40 mg Subcutaneous Q24H   [MAR Hold] fluticasone  1-2 spray Each Nare Daily   [MAR Hold] furosemide  40 mg Oral BID   [MAR Hold] insulin aspart  0-5 Units Subcutaneous QHS   [MAR Hold] insulin aspart  0-9 Units Subcutaneous TID WC   [MAR Hold] metoprolol succinate  50 mg Oral Daily   [MAR Hold] pantoprazole  40 mg Oral Daily   [MAR Hold] pravastatin  20 mg Oral Daily   [MAR Hold] sertraline  150 mg Oral Daily   sodium chloride flush  3 mL Intravenous Q12H   [MAR Hold] spironolactone  25 mg Oral Daily   Continuous Infusions:  sodium chloride     sodium chloride 10 mL/hr at 08/10/21 0950   PRN Meds: sodium chloride, [MAR Hold] acetaminophen **OR** [MAR Hold] acetaminophen, [MAR Hold] albuterol, [MAR Hold] clonazePAM, [MAR Hold] guaiFENesin, Heparin (Porcine) in NaCl, [MAR Hold] hydrALAZINE, [MAR Hold] lactulose, lidocaine (PF), midazolam, [MAR Hold] ondansetron **OR** [MAR Hold] ondansetron (ZOFRAN) IV, [MAR Hold] phenylephrine, [MAR Hold] polyethylene glycol, sodium chloride flush   Vital Signs    Vitals:   08/09/21 2100 08/09/21 2133 08/10/21 0416 08/10/21 0921  BP:  113/62 126/67   Pulse: 69 74 68   Resp: (!) 22 20 (!) 21   Temp: 98.6 F (37 C) 98.5 F (36.9 C) 98.5 F (36.9 C)   TempSrc: Oral  Oral   SpO2:   98%   Weight:   85.3 kg 85.3 kg  Height:        Intake/Output Summary (Last 24 hours) at 08/10/2021 1047 Last data filed at 08/10/2021 8315 Gross per 24 hour  Intake 0 ml  Output 2850 ml  Net -2850 ml   Filed Weights   08/09/21 0507 08/10/21 0416 08/10/21 0921  Weight: 87.6 kg 85.3 kg 85.3 kg    Telemetry    Sinus rhythm-  Personally Reviewed  ECG    None today- Personally Reviewed  Physical Exam    General: Comfortable, sitting up in bed Head: Atraumatic, normal size  Eyes: PEERLA, EOMI  Neck: Supple, normal JVD Cardiac: Normal S1, S2; RRR; no murmurs, rubs, or gallops Lungs: Clear to auscultation bilaterally Abd: Soft, nontender, no hepatomegaly  Ext: warm, no edema Musculoskeletal: No deformities, BUE and BLE strength normal and equal Skin: Warm and dry, no rashes   Neuro: Alert and oriented to person, place, time, and situation, CNII-XII grossly intact, no focal deficits  Psych: Normal mood and affect   Labs    Chemistry Recent Labs  Lab 08/05/21 2054 08/05/21 2101 08/06/21 0415 08/07/21 0501 08/08/21 0500 08/09/21 0455 08/10/21 0734  NA 134*   < > 134*   < > 139 140 138  K 2.8*   < > 3.0*   < > 3.4* 3.2* 3.6  CL 91*  --  91*   < > 89* 86* 87*  CO2 31  --  32   < > 42* 45* 42*  GLUCOSE 193*  --  346*   < > 159* 109* 95  BUN 36*  --  35*   < > _0 CREATININE 1.33*  --  1.31*   < >  0.92 0.74 0.66  CALCIUM 9.5  --  9.4   < > 9.8 9.5 9.1  PROT 6.2*  --  5.8*  --   --   --   --   ALBUMIN 2.7*  --  2.5*  --   --   --   --   AST 23  --  21  --   --   --   --   ALT 33  --  29  --   --   --   --   ALKPHOS 86  --  77  --   --   --   --   BILITOT 3.0*  --  2.2*  --   --   --   --   GFRNONAA 48*  --  49*   < > >60 >60 >60  ANIONGAP 12  --  11   < > _0 < > = values in this interval not displayed.     Hematology Recent Labs  Lab 08/07/21 0501 08/08/21 0500 08/09/21 0455  WBC 6.5 5.8 5.9  RBC 5.24* 5.07 4.84  HGB 10.8* 10.7* 10.0*  HCT 42.6 40.6 39.4  MCV 81.3 80.1 81.4  MCH 20.6* 21.1* 20.7*  MCHC 25.4* 26.4* 25.4*  RDW 22.7* 22.3* 21.8*  PLT 101* 90* 95*    Cardiac EnzymesNo results for input(s): TROPONINI in the last 168 hours. No results for input(s): TROPIPOC in the last 168 hours.   BNP Recent Labs  Lab 08/05/21 2054  BNP 550.9*     DDimer No results  for input(s): DDIMER in the last 168 hours.   Radiology    DG Chest 1 View  Result Date: 08/09/2021 CLINICAL DATA:  Status post right thoracentesis. EXAM: CHEST  1 VIEW COMPARISON:  08/05/2021. FINDINGS: 0953 hours. Right base collapse/consolidation with small to moderate right pleural effusion, new in the interval. No evidence for pneumothorax. Cardiopericardial silhouette is at upper limits of normal for size. Interstitial markings are diffusely coarsened. IMPRESSION: 1. New right base collapse/consolidation with small to moderate right pleural effusion. 2. No pneumothorax. Electronically Signed   By: Misty Stanley M.D.   On: 08/09/2021 10:02   CT CHEST W CONTRAST  Result Date: 08/08/2021 CLINICAL DATA:  Chronic dyspnea of uncertain etiology, history COPD EXAM: CT CHEST WITH CONTRAST TECHNIQUE: Multidetector CT imaging of the chest was performed during intravenous contrast administration. Imaging performed with patient in RIGHT lateral decubitus position. RADIATION DOSE REDUCTION: This exam was performed according to the departmental dose-optimization program which includes automated exposure control, adjustment of the mA and/or kV according to patient size and/or use of iterative reconstruction technique. CONTRAST:  37m OMNIPAQUE IOHEXOL 350 MG/ML SOLN IV COMPARISON:  02/08/2015 FINDINGS: Cardiovascular: Atherosclerotic calcifications aorta and coronary arteries. Aorta normal caliber. Heart unremarkable. No pericardial effusion. Mediastinum/Nodes: Enlarged RIGHT paratracheal lymph node 12 mm image 55. Additional scattered normal sized mediastinal nodes. Base of cervical region normal appearance. Esophagus unremarkable. Lungs/Pleura: Moderate to large RIGHT pleural effusion. Significant atelectasis of RIGHT lung with complete atelectasis of RIGHT middle and RIGHT lower lobes and partial atelectasis of RIGHT upper lobe. Scattered infiltrate RIGHT upper lobe. LEFT lung clear. No pneumothorax. Upper  Abdomen: Splenomegaly, spleen 21.0 x 5.8 x 16.0 cm (volume = 1000 cm^3). Fatty infiltration of liver. Hypervascular focus posterior RIGHT lobe liver 17 x 11 mm image 140, nonspecific. 2.1 x 1.9 cm LEFT adrenal nodule measuring 44 Hounsfield units. Remaining visualized upper abdomen unremarkable. Musculoskeletal: No osseous  abnormalities. IMPRESSION: Moderate to large RIGHT pleural effusion with complete atelectasis of RIGHT middle and RIGHT lower lobes and partial atelectasis of RIGHT upper lobe. Scattered infiltrate in RIGHT upper lobe. Nonspecific enlarged RIGHT paratracheal lymph node 12 mm short axis. Splenomegaly. 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 Recommend adrenal washout CT or chemical shift MRI. JACR 2017 Aug; 14(8):1038-44, JCAT 2016 Mar-Apr; 40(2):194-200, Urol J 2006 Spring; 3(2):71-4. Fatty infiltration of liver with nonspecific hypervascular focus posterior RIGHT lobe liver 17 x 11 mm, could represent a flash filling hemangioma or other vascular lesion, can be assessed at time of LEFT adrenal mass evaluation. Aortic Atherosclerosis (ICD10-I70.0). Electronically Signed   By: Lavonia Dana M.D.   On: 08/08/2021 15:24   IR THORACENTESIS ASP PLEURAL SPACE W/IMG GUIDE  Result Date: 08/09/2021 INDICATION: Chronic dyspnea of uncertain etiology. Right pleural effusion. Request for diagnostic and therapeutic thoracentesis. EXAM: ULTRASOUND GUIDED RIGHT THORACENTESIS MEDICATIONS: 1% lidocaine 10 mL COMPLICATIONS: None immediate. PROCEDURE: An ultrasound guided thoracentesis was thoroughly discussed with the patient and questions answered. The benefits, risks, alternatives and complications were also discussed. The patient understands and wishes to proceed with the procedure. Written consent was obtained. Ultrasound was performed to localize and mark an adequate pocket of fluid in the right chest. The area was then prepped and draped in the normal sterile fashion. 1% Lidocaine was used  for local anesthesia. Under ultrasound guidance a 6 Fr Safe-T-Centesis catheter was introduced. Thoracentesis was performed. The catheter was removed and a dressing applied. FINDINGS: A total of approximately 1.3 L of clear yellow fluid was removed. Samples were sent to the laboratory as requested by the clinical team. IMPRESSION: Successful ultrasound guided right thoracentesis yielding 1.3 L of pleural fluid. Procedure performed by: Gareth Eagle, PA-C Electronically Signed   By: Miachel Roux M.D.   On: 08/09/2021 13:30    Cardiac Studies   TTE 08/06/2021 IMPRESSIONS   1. Left ventricular ejection fraction, by estimation, is 60 to 65%. The left ventricle has normal function. The left ventricle has no regional wall motion abnormalities. There is mild left ventricular hypertrophy.  Left ventricular diastolic parameters are consistent with Grade II diastolic dysfunction (pseudonormalization).  Elevated left atrial pressure. There is the interventricular septum is flattened in systole and diastole, consistent with right ventricular pressure and volume overload.   2. Right ventricular systolic function is moderately reduced. The right ventricular size is moderately enlarged. There is moderately elevated pulmonary artery systolic pressure. The estimated right ventricular systolic pressure is 09.6 mmHg.   3. Right atrial size was severely dilated.   4. The mitral valve is normal in structure. No evidence of mitral valve regurgitation. No evidence of mitral stenosis.   5. Tricuspid valve regurgitation is mild to moderate.   6. The aortic valve is tricuspid. Aortic valve regurgitation is trivial.No aortic stenosis is present.   7. The inferior vena cava is dilated in size with >50% respiratory variability, suggesting right atrial pressure of 8 mmHg.   FINDINGS   Left Ventricle: Left ventricular ejection fraction, by estimation, is 60 to 65%. The left ventricle has normal function. The left ventricle has no  regional wall motion abnormalities. The left ventricular internal cavity size was normal in size. There is   mild left ventricular hypertrophy. The interventricular septum is flattened in systole and diastole, consistent with right ventricular  pressure and volume overload. Left ventricular diastolic parameters are consistent with Grade II diastolic dysfunction   (pseudonormalization). Elevated left atrial pressure.  Right Ventricle: The right ventricular size is moderately enlarged. Right ventricular wall thickness was not well visualized. Right ventricular systolic function is moderately reduced. There is moderately elevated pulmonary artery systolic pressure. The tricuspid regurgitant velocity is 3.57 m/s, and with an assumed right atrial pressure of 8 mmHg, the estimated right ventricular systolic pressure is 08.0 mmHg.   Left Atrium: Left atrial size was normal in size.   Right Atrium: Right atrial size was severely dilated.   Pericardium: Trivial pericardial effusion is present.   Mitral Valve: The mitral valve is normal in structure. No evidence of mitral valve regurgitation. No evidence of mitral valve stenosis.   Tricuspid Valve: The tricuspid valve is normal in structure. Tricuspid valve regurgitation is mild to moderate.   Aortic Valve: The aortic valve is tricuspid. Aortic valve regurgitation is trivial. No aortic stenosis is present. Aortic valve peak gradient measures 8.2 mmHg.   Pulmonic Valve: The pulmonic valve was not well visualized. Pulmonic valve regurgitation is trivial.   Aorta: The aortic root and ascending aorta are structurally normal, with no evidence of dilitation.   Venous: The inferior vena cava is dilated in size with greater than 50% respiratory variability, suggesting right atrial pressure of 8 mmHg.   IAS/Shunts: The interatrial septum was not well visualized.   Patient Profile     51 y.o. female history of obesity, hypertension, chronic smoking as well  as GERD.  Mated for acute respiratory failure found to have right ventricular dysfunction with pulmonary hypertension  Assessment & Plan    Acute hypoxic respiratory failure Shortness of breath Right ventricular dysfunction with pulmonary hypertension Mild to moderate tricuspid regurgitation COPD Liver cirrhosis  Plans for right heart catheterization today.  Noted pulmonary hypertension on TTE with pulmonary pressures of 59 mmHg-suspect pulmonary hypertension is multifactorial Group 1 with liver cirrhosis as well as group 3 COPD.  We will get more information from her right heart catheterization about her pulmonary artery systolic pressure as well as her LVEDP. This will help guide [place] with treatment as well.  The patient understands that risks include but are not limited to stroke (1 in 1000), death (1 in 44), kidney failure [usually temporary] (1 in 500), bleeding (1 in 200), allergic reaction [possibly serious] (1 in 200), and agrees to proceed.  In terms of her pulmonary hypertension I will defer to our advanced heart failure team for treatment.  Suspect she may benefit from phosphodiesterase inhibitors but will defer to the advanced heart failure team.  COPD see management of primary team.    For questions or updates, please contact [facility] Please consult [email or web address] for contact info under Cardiology/STEMI.      Signed, Berniece Salines, DO  08/10/2021, 10:47 AM

## 2021-08-10 NOTE — Assessment & Plan Note (Addendum)
-  Multifactorial secondary to pulmonary edema and right pleural effusion and possibly underlying COPD as she is a lifelong smoker - She remains on 6 L of oxygen and chest x-ray reveals that the right lung is still collapsed-have ordered incentive spirometry and encourage ambulation - VQ scan on 2/17 neg for PE - she has had an acute increase in sputum production- PCCM has started Prednisone 20 mg and Doxy x 5 days, Anoro and ordered Scleroderma antibody & Alpha-1 antitrypsin testing - plan for outpt PFTs and sleep study

## 2021-08-10 NOTE — Consult Note (Signed)
Advanced Heart Failure Team Consult Note   Primary Physician: Jilda Panda, MD PCP-Cardiologist:  None  Reason for Consultation: Pulmonary hypertension/RV failure  HPI:    Pamela Ho is seen today for evaluation of pulmonary hypertension/RV failure at the request of Dr. Harriet Masson.   51 y.o. with history of HTN, COPD/smoking 1.5 ppd until about 2 wks ago, GERD, and cirrhosis noted since 2016 by imaging though patient unaware.  She reports several months of worsening exertional dyspnea.  The week prior to admission, she became short of breath with any exertion and had productive cough.  No fever.  She had peripheral edema, orthopnea, and noted abdominal swelling.    She came to the ER. Troponin was mildly elevated with no trend.  BNP 550.  COVID-19 and flu negative.  She was thought to have CHF and was started on IV Lasix.  Echo showed EF 60-65%, moderately dilated and moderately dysfunctional RV, D-shaped septum, PASP 59 mmHg, and severe RAE.  Abdominal US showed cirrhosis with minimal ascites and splenomegaly was noted by CT.  NH3 was elevated so lactulose was begun initially (off now).  CT chest showed splenomegaly with large right effusion, right paratracheal node enlarged, right-sided atelectasis, and scattered infiltration RUL.  She had a right thoracentesis with 1.3 L off  Patient was not aware of the cirrhosis diagnosis. HCV/HBV/HAV serologies negative.  She denies ETOH.  Patient was taken for RHC today, results below:  RHC Procedural Findings: Hemodynamics (mmHg) RA mean 12 RV 77/15 PA 78/28, mean 46 PCWP mean 15 Oxygen saturations: SVC 77% PA 71% AO 99% Cardiac Output (Fick) 8.27  Cardiac Index (Fick) 4.45 PVR 3.7 WU  Review of Systems: All systems reviewed and negative except as per HPI.   Home Medications Prior to Admission medications   Medication Sig Start Date End Date Taking? Authorizing Provider  acetaminophen (TYLENOL) 500 MG tablet Take 500 mg by  mouth every 6 (six) hours as needed for mild pain.   Yes [provider]  chlorthalidone (HYGROTON) 25 MG tablet Take 25 mg by mouth daily. 07/22/21  Yes [provider]  cholecalciferol (VITAMIN D3) 25 MCG (1000 UNIT) tablet Take 1,000 Units by mouth daily.   Yes [provider]  clonazePAM (KLONOPIN) 0.5 MG tablet Take 0.5 mg by mouth 2 (two) times daily as needed for anxiety.   Yes [provider]  guaiFENesin (MUCINEX) 600 MG 12 hr tablet Take 600 mg by mouth 2 (two) times daily as needed for cough.   Yes [provider]  LEXAPRO 10 MG tablet Take 20 mg by mouth daily. 07/16/21  Yes [provider]  metoprolol succinate (TOPROL-XL) 50 MG 24 hr tablet Take 50 mg by mouth daily. 07/20/21  Yes [provider]  omeprazole (PRILOSEC) 20 MG capsule Take 20 mg by mouth daily.   Yes [provider]  pravastatin (PRAVACHOL) 20 MG tablet Take 20 mg by mouth daily.   Yes [provider]  sertraline (ZOLOFT) 100 MG tablet Take 150 mg by mouth daily.   Yes [provider]  spironolactone (ALDACTONE) 25 MG tablet Take 1 tablet (25 mg total) by mouth daily. 02/09/15  Yes Verlee Monte, MD  IRON PO Take 1 tablet by mouth daily.  Patient not taking: Reported on 08/05/2021    [provider]  lisinopril (PRINIVIL,ZESTRIL) 10 MG tablet Take 1 tablet (10 mg total) by mouth daily. Patient not taking: Reported on 08/05/2021 02/09/15   Verlee Monte, MD  metoprolol (LOPRESSOR) 50 MG tablet Take 1 tablet (50 mg total) by mouth 2 (two) times daily. Patient not taking: Reported on 08/05/2021 02/09/15   Verlee Monte, MD    Past Medical History: Past Medical History:  Diagnosis Date   Acute congestive heart failure ([place]) 08/05/2021   Anemia    Anxiety    COPD (chronic obstructive pulmonary disease) (HCC)    Depression    GERD (gastroesophageal reflux disease)    Heart murmur    Hypertension     Past Surgical  History: Past Surgical History:  Procedure Laterality Date   CHOLECYSTECTOMY     cyst from neck     IR THORACENTESIS ASP PLEURAL SPACE W/IMG GUIDE  08/09/2021    Family History: Family History  Problem Relation Age of Onset   Diabetes Mother    Heart disease Mother    Hyperlipidemia Mother    Diabetes Father    Cancer Father    Heart disease Father    Hyperlipidemia Father    Hypertension Father    Heart disease Maternal Grandmother    Hyperlipidemia Maternal Grandmother    Hypertension Maternal Grandmother    Heart disease Maternal Grandfather    Hyperlipidemia Maternal Grandfather    Hypertension Maternal Grandfather    Heart disease Paternal Grandmother    Hyperlipidemia Paternal Grandmother    Hypertension Paternal Grandmother    Stroke Paternal Grandmother    Diabetes Paternal Grandmother    Cancer Paternal Grandmother    Breast cancer Neg Hx     Social History: Social History   Socioeconomic History   Marital status: Married    Spouse name: Angelize Ryce   Number of children: 2   Years of education: Not on file   Highest education level: Some college, no degree  Occupational History   Occupation: unemployed  Tobacco Use   Smoking status: Every Day    Packs/day: 1.50    Years: 30.00    Pack years: 45.00    Types: Cigarettes   Smokeless tobacco: Never  Vaping Use   Vaping Use: Never used  Substance and Sexual Activity   Alcohol use: No    Alcohol/week: 0.0 standard drinks   Drug use: No   Sexual activity: Not on file  Other Topics Concern   Not on file  Social History Narrative   Not on file   Social Determinants of Health   Financial Resource Strain: High Risk   Difficulty of Paying Living Expenses: Hard  Food Insecurity: No Food Insecurity   Worried About Running Out of Food in the Last Year: Never true   Ran Out of Food in the Last Year: Never true  Transportation Needs: No Transportation Needs   Lack of Transportation (Medical): No    Lack of Transportation (Non-Medical): No  Physical Activity: Not on file  Stress: Not on file  Social Connections: Not on file    Allergies:  Allergies  Allergen Reactions   Lexapro [Escitalopram]     "Made me crazy"    Lisinopril     "Angioedema"    Objective:    Vital Signs:   Temp:  [98.5 F (36.9 C)-98.6 F (37 C)] 98.5 F (36.9 C) (02/17 1132) Pulse Rate:  [0-84] 69 (02/17 1132) Resp:  [12-25] 20 (02/17 1132) BP: (94-133)/(45-82) 101/50 (02/17 1132) SpO2:  [85 %-99 %] 96 % (02/17 1132) Weight:  [85.3 kg] 85.3 kg (02/17 0921) Last BM Date : 08/09/21  Weight change: Filed Weights   08/09/21 0507 08/10/21  4503 08/10/21 0921  Weight: 87.6 kg 85.3 kg 85.3 kg    Intake/Output:   Intake/Output Summary (Last 24 hours) at 08/10/2021 1231 Last data filed at 08/10/2021 8882 Gross per 24 hour  Intake 0 ml  Output 2850 ml  Net -2850 ml      Physical Exam    General:  Uncomfortable, orthopneic.  HEENT: normal Neck: supple. JVP 10 cm. Carotids 2+ bilat; no bruits. No lymphadenopathy or thyromegaly appreciated. Cor: PMI nondisplaced. Regular rate & rhythm. No rubs, gallops or murmurs. Lungs: Decreased right base.  Abdomen: soft, nontender, mildly distended. No hepatosplenomegaly. No bruits or masses. Good bowel sounds. Extremities: no cyanosis, clubbing, rash, edema Neuro: alert & orientedx3, cranial nerves grossly intact. moves all 4 extremities w/o difficulty. Affect pleasant   Telemetry   NSR with occasional PVCs (personally reviewed)  EKG    NSR, nonspecific ST changes (personally reviewed)  Labs   Basic Metabolic Panel: Recent Labs  Lab 08/06/21 0415 08/07/21 0501 08/08/21 0500 08/09/21 0455 08/10/21 0734  NA 134* 139 139 140 138  K 3.0* 2.6* 3.4* 3.2* 3.6  CL 91* 88* 89* 86* 87*  CO2 32 41* 42* 45* 42*  GLUCOSE 346* 114* 159* 109* 95  BUN 35* 24* _0 CREATININE 1.31* 1.04* 0.92 0.74 0.66  CALCIUM 9.4 10.1 9.8 9.5 9.1  MG 1.8  --  1.6*   --   --     Liver Function Tests: Recent Labs  Lab 08/05/21 2054 08/06/21 0415  AST 23 21  ALT 33 29  ALKPHOS 86 77  BILITOT 3.0* 2.2*  PROT 6.2* 5.8*  ALBUMIN 2.7* 2.5*   No results for input(s): LIPASE, AMYLASE in the last 168 hours. Recent Labs  Lab 08/06/21 0415 08/07/21 0501  AMMONIA 51* 40*    CBC: Recent Labs  Lab 08/05/21 2054 08/05/21 2101 08/07/21 0501 08/08/21 0500 08/09/21 0455  WBC 4.4  --  6.5 5.8 5.9  NEUTROABS 3.2  --  5.5 4.5 4.6  HGB 11.5* 14.3 10.8* 10.7* 10.0*  HCT 43.3 42.0 42.6 40.6 39.4  MCV 79.3*  --  81.3 80.1 81.4  PLT 110*  --  101* 90* 95*    Cardiac Enzymes: No results for input(s): CKTOTAL, CKMB, CKMBINDEX, TROPONINI in the last 168 hours.  BNP: BNP (last 3 results) Recent Labs    08/05/21 2054  BNP 550.9*    ProBNP (last 3 results) No results for input(s): PROBNP in the last 8760 hours.   CBG: Recent Labs  Lab 08/09/21 1059 08/09/21 1554 08/09/21 2105 08/10/21 0602 08/10/21 1128  GLUCAP 142* 193* 108* 107* 119*    Coagulation Studies: No results for input(s): LABPROT, INR in the last 72 hours.   Imaging   CARDIAC CATHETERIZATION  Result Date: 08/10/2021 1. Borderline elevated PCWP. 2. Severe pulmonary arterial hypertension. 3. Elevated RA pressure. 4. High cardiac output. 5. PVR 3.7.  Not markedly high likely due to high cardiac output, possible that cirrhosis contributes.     Medications:     Current Medications:  enoxaparin (LOVENOX) injection  40 mg Subcutaneous Q24H   fluticasone  1-2 spray Each Nare Daily   furosemide  40 mg Intravenous BID   insulin aspart  0-5 Units Subcutaneous QHS   insulin aspart  0-9 Units Subcutaneous TID WC   metoprolol succinate  50 mg Oral Daily   pantoprazole  40 mg Oral Daily   pravastatin  20 mg Oral Daily   sertraline  150 mg Oral  Daily   spironolactone  25 mg Oral Daily    Infusions:   Assessment/Plan   1. Diastolic HF/Pulmonary hypertension/RV failure:  Patient presented with CHF.  Admission echo showed EF 60-65%, moderately dilated and moderately dysfunctional RV, D-shaped septum, PASP 59 mmHg, and severe RAE (no prior).  RHC today after diuresis showed borderline elevated PCWP with severe pulmonary hypertension and elevated RA pressure.  Cause of pulmonary hypertension/RV failure is uncertain.  PVR was elevated but not markedly so at 3.7 WU due to high cardiac output that may be related to cirrhosis.  It is certainly possible that the patient could have portopulmonary hypertension (form of group 1 PH) in setting of cirrhosis. She is a smoker carrying a history of COPD, so cannot rule out group 3 PH.  However, CT chest does not seem to have extensive lung parenchymal disease.  - With elevated right-sided pressures and significant dyspnea, would continue Lasix 40 mg IV bid for at least another day. Replace K.  - Needs V/Q scan to rule out chronic PE.  - Needs full serologic workup for group 1 PH.  HIV and ANA were negative.  Need to send RF, anti-centromere Ab, and anti-SCL70.  - Will need eventual sleep study.  - Will need PFTs when fully diuresed.  - I would like pulmonary evaluation.  I wonder how much of a role parenchymal lung disease plays in her pulmonary hypertension (group 3 component).  She is a heavy smoker who carries history of COPD.  There was also a question of sarcoidosis on her prior CT chest in 2016.  If she is thought to have primarily group 1 [place] (?portopulmonary hypertension), treatment with selective pulmonary vasodilators would be reasonable.  2. Cirrhosis: Cause is uncertain.  Noted by imaging back in 2016.  She has evidence for both cirrhosis and splenomegaly on imaging this admission.  Tbili elevated, albumin low.  NH3 elevated at 51.  Viral hepatitis serologies were negative.  She denies ETOH use.  ?NAFLD with progression to cirrhosis, also could be related to RV failure (though I am concerned cirrhosis actually came first and led  to portopulmonary hypertension).  3. Pleural effusion: On right, s/p thoracentesis.  ?Due to CHF versus cirrhosis.   Length of Stay: [place], MD  08/10/2021, 12:31 PM  Advanced Heart Failure Team Pager [phone] (M-F; 7a - 5p)  Please contact [facility] for night-coverage after hours (4p -7a ) and weekends on [email or web address]

## 2021-08-10 NOTE — Progress Notes (Signed)
Heart Failure Nurse Navigator Progress Note  AHF rounding team consulted/following this admission. TOC appt cx'd  Kevan Rosebush, RN, BSN, [facility] Heart Failure Navigator Heart & Vascular Care Navigation Team

## 2021-08-10 NOTE — Hospital Course (Signed)
This is a 51 year old female with COPD, anxiety, hypertension, nicotine abuse who presented to the hospital for acute on chronic shortness of breath, orthopnea, lower extremity edema and was felt to have acute congestive heart failure and diuresed with IV Lasix.    2D echo reveals an EF of 60 to 82%, grade 2 diastolic dysfunction and an RVSP of 59.  On 2/15, she underwent a CT without contrast of the chest which revealed a large right-sided pleural effusion.  On 2/16, approximately 1.3 L of fluid was removed from the right pleural space. Aafter she was felt to be adequately diuresed, cardiology was consulted for possible right heart cath.

## 2021-08-11 LAB — BASIC METABOLIC PANEL WITH GFR
Anion gap: 11 (ref 5–15)
BUN: 11 mg/dL (ref 6–20)
CO2: 40 mmol/L — ABNORMAL HIGH (ref 22–32)
Calcium: 9.5 mg/dL (ref 8.9–10.3)
Chloride: 85 mmol/L — ABNORMAL LOW (ref 98–111)
Creatinine, Ser: 0.82 mg/dL (ref 0.44–1.00)
GFR, Estimated: >60 mL/min (ref >60)
Glucose, Bld: 186 mg/dL — ABNORMAL HIGH (ref 70–99)
Potassium: 3.8 mmol/L (ref 3.5–5.1)
Sodium: 136 mmol/L (ref 135–145)

## 2021-08-11 LAB — GLUCOSE, CAPILLARY
Glucose-Capillary: 87 mg/dL (ref 70–99)
Glucose-Capillary: 159 mg/dL — ABNORMAL HIGH (ref 70–99)
Glucose-Capillary: 210 mg/dL — ABNORMAL HIGH (ref 70–99)
Glucose-Capillary: 220 mg/dL — ABNORMAL HIGH (ref 70–99)

## 2021-08-11 NOTE — Progress Notes (Signed)
Patient ID: Pamela Ho, female   DOB: 1970/11/11, 51 y.o.   MRN: [id number]     Advanced Heart Failure Rounding Note  PCP-Cardiologist: None   Subjective:    Looks more comfortable today but still on 6L oxygen (no oxygen at home).    Seen by pulmonary yesterday, started on Anoro, prednisone, doxycycline for CHF exacerbation.   V/Q scan: No acute/chronic PE.   CXR (2/17): Moderate right effusion.   RHC Procedural Findings: Hemodynamics (mmHg) RA mean 12 RV 77/15 PA 778/28, mean 46 PCWP mean 15 Oxygen saturations: SVC 77% PA 71% AO 99% Cardiac Output (Fick) 8.27  Cardiac Index (Fick) 4.45 PVR 3.7 WU   Objective:   Weight Range: 83.7 kg Body mass index is 33.76 kg/m.   Vital Signs:   Temp:  [98.1 F (36.7 C)-98.5 F (36.9 C)] 98.1 F (36.7 C) (02/18 0302) Pulse Rate:  [0-84] 62 (02/18 0742) Resp:  [12-20] 18 (02/18 0742) BP: (83-133)/(50-82) 100/55 (02/18 0302) SpO2:  [85 %-99 %] 95 % (02/18 0742) FiO2 (%):  [40 %] 40 % (02/18 0742) Weight:  [83.7 kg] 83.7 kg (02/18 0305) Last BM Date : 08/10/21  Weight change: Filed Weights   08/10/21 0416 08/10/21 0921 08/11/21 0305  Weight: 85.3 kg 85.3 kg 83.7 kg    Intake/Output:   Intake/Output Summary (Last 24 hours) at 08/11/2021 0955 Last data filed at 08/11/2021 0305 Gross per 24 hour  Intake 243 ml  Output 1150 ml  Net -907 ml      Physical Exam    General:  Well appearing. No resp difficulty HEENT: Normal Neck: Thick. JVP difficult. Carotids 2+ bilat; no bruits. No lymphadenopathy or thyromegaly appreciated. Cor: PMI nondisplaced. Regular rate & rhythm. No rubs, gallops or murmurs. Lungs: Decreased right base.  Abdomen: Soft, nontender, nondistended. No hepatosplenomegaly. No bruits or masses. Good bowel sounds. Extremities: No cyanosis, clubbing, rash, edema Neuro: Alert & orientedx3, cranial nerves grossly intact. moves all 4 extremities w/o difficulty. Affect pleasant   Telemetry    NSR (personally reviewed)  Labs    CBC Recent Labs    08/09/21 0455 08/10/21 1110 08/10/21 1111 08/10/21 1113  WBC 5.9  --   --   --   NEUTROABS 4.6  --   --   --   HGB 10.0*   < > 12.9 12.6  HCT 39.4   < > 38.0 37.0  MCV 81.4  --   --   --   PLT 95*  --   --   --    < > = values in this interval not displayed.   Basic Metabolic Panel Recent Labs    08/09/21 0455 08/10/21 0734 08/10/21 1110 08/10/21 1111 08/10/21 1113  NA 140 138   < > 137 139  K 3.2* 3.6   < > 3.7 3.6  CL 86* 87*  --   --   --   CO2 45* 42*  --   --   --   GLUCOSE 109* 95  --   --   --   BUN 12 9  --   --   --   CREATININE 0.74 0.66  --   --   --   CALCIUM 9.5 9.1  --   --   --    < > = values in this interval not displayed.   Liver Function Tests No results for input(s): AST, ALT, ALKPHOS, BILITOT, PROT, ALBUMIN in the last 72 hours. No results for input(s):  LIPASE, AMYLASE in the last 72 hours. Cardiac Enzymes No results for input(s): CKTOTAL, CKMB, CKMBINDEX, TROPONINI in the last 72 hours.  BNP: BNP (last 3 results) Recent Labs    08/05/21 2054  BNP 550.9*    ProBNP (last 3 results) No results for input(s): PROBNP in the last 8760 hours.   D-Dimer No results for input(s): DDIMER in the last 72 hours. Hemoglobin A1C No results for input(s): HGBA1C in the last 72 hours. Fasting Lipid Panel No results for input(s): CHOL, HDL, LDLCALC, TRIG, CHOLHDL, LDLDIRECT in the last 72 hours. Thyroid Function Tests No results for input(s): TSH, T4TOTAL, T3FREE, THYROIDAB in the last 72 hours.  Invalid input(s): FREET3  Other results:   Imaging    DG Chest 2 View  Result Date: 08/10/2021 CLINICAL DATA:  Chest tightness and shortness of breath. EXAM: CHEST - 2 VIEW COMPARISON:  Yesterday.  CT of 08/08/2021 also reviewed. FINDINGS: Midline trachea. Cardiomegaly, accentuated by AP portable technique. Similar moderate right pleural effusion. No left effusion or pneumothorax. Moderate  interstitial edema, increased. Right base airspace disease. IMPRESSION: Congestive heart failure, slightly increased. Similar moderate right pleural effusion with adjacent basilar atelectasis. Electronically Signed   By: Abigail Miyamoto M.D.   On: 08/10/2021 14:24   CARDIAC CATHETERIZATION  Result Date: 08/10/2021 1. Borderline elevated PCWP. 2. Severe pulmonary arterial hypertension. 3. Elevated RA pressure. 4. High cardiac output. 5. PVR 3.7.  Not markedly high likely due to high cardiac output, possible that cirrhosis contributes.   NM Pulmonary Perf and Vent  Result Date: 08/10/2021 CLINICAL DATA:  PE suspected EXAM: NUCLEAR MEDICINE PERFUSION LUNG SCAN TECHNIQUE: Perfusion images were obtained in multiple projections after intravenous injection of radiopharmaceutical. Ventilation scans intentionally deferred if perfusion scan and chest x-ray adequate for interpretation during COVID 19 epidemic. RADIOPHARMACEUTICALS:  4.0 mCi Tc-34mMAA IV COMPARISON:  None. FINDINGS: Matched perfusion defect at the right lung base, in keeping with pleural effusion seen by prior radiographs. Otherwise homogeneous perfusion of the lungs without suspicious filling defect. Cardiomegaly. IMPRESSION: 1. Matched perfusion defect at the right lung base, in keeping with pleural effusion seen by prior radiographs. No suspicious perfusion defect. Very low probability examination for pulmonary embolism by modified perfusion only PIOPED criteria (PE absent). 2.  Cardiomegaly. Electronically Signed   By: ADelanna AhmadiM.D.   On: 08/10/2021 15:41     Medications:     Scheduled Medications:  doxycycline  100 mg Oral Q12H   enoxaparin (LOVENOX) injection  40 mg Subcutaneous Q24H   fluticasone  1-2 spray Each Nare Daily   furosemide  40 mg Intravenous BID   influenza vac split quadrivalent PF  0.5 mL Intramuscular Tomorrow-1000   insulin aspart  0-5 Units Subcutaneous QHS   insulin aspart  0-9 Units Subcutaneous TID WC    metoprolol succinate  50 mg Oral Daily   pantoprazole  40 mg Oral Daily   pravastatin  20 mg Oral Daily   predniSONE  20 mg Oral Q breakfast   sertraline  150 mg Oral Daily   spironolactone  25 mg Oral Daily   umeclidinium-vilanterol  1 puff Inhalation Daily    Infusions:   PRN Medications: acetaminophen **OR** acetaminophen, albuterol, clonazePAM, guaiFENesin, hydrALAZINE, lactulose, ondansetron **OR** ondansetron (ZOFRAN) IV, phenylephrine, polyethylene glycol   Assessment/Plan   1. Diastolic HF/Pulmonary hypertension/RV failure: Patient presented with CHF.  Admission echo showed EF 60-65%, moderately dilated and moderately dysfunctional RV, D-shaped septum, PASP 59 mmHg, and severe RAE (no prior).  [place]  today after diuresis showed borderline elevated PCWP with severe pulmonary hypertension and elevated RA pressure.  Cause of pulmonary hypertension/RV failure is uncertain.  PVR was elevated but not markedly so at 3.7 WU due to high cardiac output that may be related to cirrhosis.  It is certainly possible that the patient could have portopulmonary hypertension (form of group 1 PH) in setting of cirrhosis. She is a smoker carrying a history of COPD, suspect component of group 3 PH.  However, CT chest does not seem to have extensive lung parenchymal disease => she was seen by pulmonary, thought to have emphysema on chest CT. V/Q scan not suggestive of chronic PE.  Exam is difficult for volume, still with significant oxygen requirement.  - With elevated right-sided pressures on RHC and significant dyspnea, would continue Lasix 40 mg IV bid today.  Needs BMET stat.  - Needs full serologic workup for group 1 PH.  HIV and ANA were negative.  Need to send RF, anti-centromere Ab, and anti-SCL70.  - Will need eventual sleep study.  - Will need PFTs when fully diuresed.  - She is a heavy smoker who carries history of COPD, emphysema on CT per pulmonary.  There is certainly a group 3 PH component.   However, there may be a component of group 1 PH (portopulmonary hypertension). Would consider Tyvaso DPI trial (would like worsening of V/Q mismatch with parenchymal lung disease as inhaled.  2. Cirrhosis: Cause is uncertain.  Noted by imaging back in 2016.  She has evidence for both cirrhosis and splenomegaly on imaging this admission.  Tbili elevated, albumin low.  NH3 elevated at 51.  Viral hepatitis serologies were negative.  She denies ETOH use.  ?NAFLD with progression to cirrhosis, also could be related to RV failure (though I am concerned cirrhosis actually came first and led to portopulmonary hypertension).  3. Pleural effusion: On right, s/p thoracentesis.  ?Due to CHF versus cirrhosis. Transudate.  Has residual moderate effusion by 2/17 CXR.  4. Acute hypoxemic respiratory failure: In setting of possible COPD exacerbation as well as RV failure/pulmonary hypertension. She remains on 6L oxygen.  - Continue diuresis today as above.  - Started on treatment for COPD exacerbation by pulmonary with prednisone, Anoro, doxycycline.    Length of Stay: [place], MD  08/11/2021, 9:55 AM  Advanced Heart Failure Team Pager [phone] (M-F; 7a - 5p)  Please contact [facility] for night-coverage after hours (5p -7a ) and weekends on [email or web address]

## 2021-08-11 NOTE — Progress Notes (Signed)
Progress Note   Patient: Pamela Ho JJO:[id number] DOB: [birth year] DOA: 08/05/2021     5 DOS: the patient was seen and examined on 08/11/2021   Brief hospital course: This is a 51 year old female with COPD, anxiety, hypertension, nicotine abuse who presented to the hospital for acute on chronic shortness of breath, orthopnea, lower extremity edema and was felt to have acute congestive heart failure and diuresed with IV Lasix.    2D echo reveals an EF of 60 to 16%, grade 2 diastolic dysfunction and an RVSP of 59.  On 2/15, she underwent a CT without contrast of the chest which revealed a large right-sided pleural effusion.  On 2/16, approximately 1.3 L of fluid was removed from the right pleural space. Aafter she was felt to be adequately diuresed, cardiology was consulted for possible right heart cath.  Assessment and Plan: Acute on chronic diastolic CHF (congestive heart failure) (HCC) With pulmonary hypertension, RV failure and essential hypertension - Question portal pulmonary hypertension -Patient presented with several months of progressive shortness of breath, cough, abdominal distention, bilateral pedal edema, orthopnea. -Elevated BNP, chest x-ray with pulm edema -Echocardiogram with EF 60 to 65%, G2 DD, and RVSP 59  -Also continued on home meds that include metoprolol succinate 50 mg daily and Aldactone 25 mg daily. -Net IO Since Admission: -6,820.16 mL [08/09/21 1318] - cont diuresis per heart failure team  Acute respiratory failure with hypoxia ([place])- (present on admission) - Multifactorial secondary to pulmonary edema and right pleural effusion and possibly underlying COPD as she is a lifelong smoker - She remains on 6 L of oxygen and chest x-ray reveals that the right lung is still collapsed-have ordered incentive spirometry and encourage ambulation - pulse ox in 70s on room air- on 5 L to keep pulse ox at 90 - VQ scan on 2/17 neg for PE - she has had an acute  increase in sputum production- PCCM has started Prednisone 20 mg and Doxy x 5 days, Anoro and ordered Scleroderma antibody & Alpha-1 antitrypsin testing - plan for outpt PFTs and sleep study  AKI (acute kidney injury) ([place])- (present on admission) -Baseline creatinine not available.  Presented with creatinine elevated to 1.33.    Recent Labs    08/05/21 2054 08/06/21 0415 08/07/21 0501 08/08/21 0500 08/09/21 0455  BUN 36* 35* 24* 18 12  CREATININE 1.33* 1.31* 1.04* 0.92 0.74     Thrombocytopenia (HCC) - present on lab work on 8/16 and thus not a new finding - it is mild-follow intermittently  Adrenal mass ([place]) -CT scan from 2/15 showed an 2.1 cm left adrenal mass, 44 HU, probable benign adenoma. -Radiologist recommended adrenal washout CT or chemical shift MRI.  -Consider imaging as outpatient  Glucose intolerance- (present on admission) -History of diabetes mellitus.  A1c 5.5.  Blood sugar level was initially elevated because of stress.  Currently running consistently less than 200. Recent Labs  Lab 08/08/21 1150 08/08/21 1612 08/08/21 2056 08/09/21 0420 08/09/21 1059  GLUCAP 156* 185* 143* 127* 142*    Hypokalemia- (present on admission) Hypomagnesemia   - Continue to replace as needed  GERD without esophagitis- (present on admission) Continuing PPI daily  Generalized anxiety disorder- (present on admission) -Continuing home regimen of as needed clonazepam -Continue home regimen of Zoloft  Cirrhosis of liver ([place])- (present on admission) -It seems patient has a history of cirrhosis since 2016.    -Unclear etiology.  Denies alcohol use.  - Acute hepatitis panel negative. AMA/ASMA/ANA negative.  - ?  If congestive hepatopathy from right heart failure vs NASH    Nicotine dependence, cigarettes, uncomplicated- (present on admission) -smoking 1- 1.5 PPD since age 72 - Patient has been counseled to stop smoking  Elevated troponin level not due myocardial  infarction- (present on admission) -Elevated troponin level likely due to LV strain.  Downtrending.   Essential hypertension     - cont diuresis per heart failure team Acute on chronic diastolic CHF (congestive heart failure) ([place]) With pulmonary hypertension, RV failure and essential hypertension - Question portal pulmonary hypertension -Patient presented with several months of progressive shortness of breath, cough, abdominal distention, bilateral pedal edema, orthopnea. -Elevated BNP, chest x-ray with pulm edema -Echocardiogram with EF 60 to 65%, G2 DD, and RVSP 59  -Also continued on home meds that include metoprolol succinate 50 mg daily and Aldactone 25 mg daily. -Net IO Since Admission: -6,820.16 mL [08/09/21 1318] - cont diuresis per heart failure team  Acute respiratory failure with hypoxia ([place])- (present on admission) - Multifactorial secondary to pulmonary edema and right pleural effusion and possibly underlying COPD as she is a lifelong smoker - She remains on 6 L of oxygen and chest x-ray reveals that the right lung is still collapsed-have ordered incentive spirometry and encourage ambulation - pulse ox in 70s on room air- on 5 L to keep pulse ox at 90 - VQ scan on 2/17 neg for PE - she has had an acute increase in sputum production- PCCM has started Prednisone 20 mg and Doxy x 5 days, Anoro and ordered Scleroderma antibody & Alpha-1 antitrypsin testing - plan for outpt PFTs and sleep study  AKI (acute kidney injury) ([place])- (present on admission) -Baseline creatinine not available.  Presented with creatinine elevated to 1.33.    Recent Labs    08/05/21 2054 08/06/21 0415 08/07/21 0501 08/08/21 0500 08/09/21 0455  BUN 36* 35* 24* 18 12  CREATININE 1.33* 1.31* 1.04* 0.92 0.74     Thrombocytopenia (HCC) - present on lab work on 8/16 and thus not a new finding - it is mild-follow intermittently  Adrenal mass ([place]) -CT scan from 2/15 showed an 2.1 cm left  adrenal mass, 44 HU, probable benign adenoma. -Radiologist recommended adrenal washout CT or chemical shift MRI.  -Consider imaging as outpatient  Glucose intolerance- (present on admission) -History of diabetes mellitus.  A1c 5.5.  Blood sugar level was initially elevated because of stress.  Currently running consistently less than 200. Recent Labs  Lab 08/08/21 1150 08/08/21 1612 08/08/21 2056 08/09/21 0420 08/09/21 1059  GLUCAP 156* 185* 143* 127* 142*    Hypokalemia- (present on admission) Hypomagnesemia   - Continue to replace as needed  GERD without esophagitis- (present on admission) Continuing PPI daily  Generalized anxiety disorder- (present on admission) -Continuing home regimen of as needed clonazepam -Continue home regimen of Zoloft  Cirrhosis of liver ([place])- (present on admission) -It seems patient has a history of cirrhosis since 2016.    -Unclear etiology.  Denies alcohol use.  - Acute hepatitis panel negative. AMA/ASMA/ANA negative.  - ? If congestive hepatopathy from right heart failure vs NASH    Nicotine dependence, cigarettes, uncomplicated- (present on admission) -smoking 1- 1.5 PPD since age 89 - Patient has been counseled to stop smoking  Elevated troponin level not due myocardial infarction- (present on admission) -Elevated troponin level likely due to LV strain.  Downtrending.   Essential hypertension       Subjective: She states she is breathing a little better today  Physical Exam: Vitals:   08/11/21 0302 08/11/21 0305 08/11/21 0742 08/11/21 1143  BP: (!) 100/55   (!) 97/57  Pulse: 61  62 66  Resp: _0 Temp: 98.1 F (36.7 C)   98 F (36.7 C)  TempSrc: Oral   Oral  SpO2: 97%  95% 95%  Weight:  83.7 kg    Height:       General exam: Appears comfortable  HEENT: PERRLA, oral mucosa moist, no sclera icterus or thrush Respiratory system: Poor breath sounds in right lower lobe, rhonchi and cough noted Cardiovascular  system: S1 & S2 heard, regular rate and rhythm Gastrointestinal system: Abdomen soft, non-tender, nondistended. Normal bowel sounds   Central nervous system: Alert and oriented. No focal neurological deficits. Extremities: No cyanosis, clubbing or edema Skin: No rashes or ulcers Psychiatry:  Mood & affect appropriate.    Data Reviewed    Family Communication: Husband at bedside  Disposition: Status is: Inpatient Remains inpatient appropriate because: IV diuresis         Planned Discharge Destination: Home     Time spent: 35  minutes  Author: Debbe Odea, MD 08/11/2021 11:53 AM  For on call review [email or web address].

## 2021-08-11 NOTE — Plan of Care (Signed)
Problem: Education: Goal: Ability to demonstrate management of disease process will improve Outcome: Progressing Goal: Ability to verbalize understanding of medication therapies will improve Outcome: Progressing   Problem: Activity: Goal: Capacity to carry out activities will improve Outcome: Progressing   Problem: Cardiac: Goal: Ability to achieve and maintain adequate cardiopulmonary perfusion will improve Outcome: Progressing   

## 2021-08-11 NOTE — Progress Notes (Signed)
Pt refuses Bi-PAP for the night. RT will continue to monitor as needed.

## 2021-08-12 LAB — BODY FLUID CULTURE W GRAM STAIN: Culture: NO GROWTH

## 2021-08-12 LAB — BASIC METABOLIC PANEL
Anion gap: 9 (ref 5–15)
BUN: 13 mg/dL (ref 6–20)
CO2: 40 mmol/L — ABNORMAL HIGH (ref 22–32)
Calcium: 9.1 mg/dL (ref 8.9–10.3)
Chloride: 87 mmol/L — ABNORMAL LOW (ref 98–111)
Creatinine, Ser: 0.73 mg/dL (ref 0.44–1.00)
GFR, Estimated: >60 mL/min (ref >60)
Glucose, Bld: 100 mg/dL — ABNORMAL HIGH (ref 70–99)
Potassium: 3.5 mmol/L (ref 3.5–5.1)
Sodium: 136 mmol/L (ref 135–145)

## 2021-08-12 LAB — GLUCOSE, CAPILLARY
Glucose-Capillary: 105 mg/dL — ABNORMAL HIGH (ref 70–99)
Glucose-Capillary: 314 mg/dL — ABNORMAL HIGH (ref 70–99)
Glucose-Capillary: 290 mg/dL — ABNORMAL HIGH (ref 70–99)
Glucose-Capillary: 217 mg/dL — ABNORMAL HIGH (ref 70–99)

## 2021-08-12 LAB — RHEUMATOID FACTOR: Rheumatoid fact SerPl-aCnc: >650 IU/mL — ABNORMAL HIGH (ref <14.0)

## 2021-08-12 MED ORDER — DOXYCYCLINE HYCLATE 100 MG PO TABS: 100.0000 mg | ORAL_TABLET | Freq: Two times a day (BID) | ORAL | Status: DC

## 2021-08-12 MED ORDER — PREDNISONE 20 MG PO TABS: 20.0000 mg | ORAL_TABLET | Freq: Every day | ORAL | 0 refills | Status: AC

## 2021-08-12 MED ORDER — POTASSIUM CHLORIDE CRYS ER 20 MEQ PO TBCR: 20.0000 meq | EXTENDED_RELEASE_TABLET | Freq: Every day | ORAL | 0 refills | Status: DC

## 2021-08-12 MED ORDER — ALBUTEROL SULFATE HFA 108 (90 BASE) MCG/ACT IN AERS: 2.0000 | INHALATION_SPRAY | Freq: Four times a day (QID) | RESPIRATORY_TRACT | 2 refills | Status: DC | PRN

## 2021-08-12 MED ORDER — DOXYCYCLINE MONOHYDRATE 100 MG PO TABS: 100.0000 mg | ORAL_TABLET | Freq: Two times a day (BID) | ORAL | 0 refills | Status: DC

## 2021-08-12 MED ORDER — UMECLIDINIUM-VILANTEROL 62.5-25 MCG/ACT IN AEPB: 1.0000 | INHALATION_SPRAY | Freq: Every day | RESPIRATORY_TRACT | 0 refills | Status: DC

## 2021-08-12 MED ORDER — POTASSIUM CHLORIDE CRYS ER 20 MEQ PO TBCR
40.0000 meq | EXTENDED_RELEASE_TABLET | Freq: Once | ORAL | Status: AC
  Administered 2021-08-12: 40 meq via ORAL
  Filled 2021-08-12: qty 2

## 2021-08-12 MED ORDER — FUROSEMIDE 40 MG PO TABS: 40.0000 mg | ORAL_TABLET | Freq: Every day | ORAL | 11 refills | Status: DC

## 2021-08-12 NOTE — Discharge Summary (Signed)
Physician Discharge Summary   Patient: Pamela Ho MRN: [id number] DOB: 02-16-1971  Admit date:     08/05/2021  Discharge date: 08/12/21  Discharge Physician: Debbe Odea   PCP: Rupert Stacks, MD   Recommendations at discharge:    Please f/u on Bmet in 1 wk Wean oxygen as able  Discharge Diagnoses: Principal Problem:   Acute respiratory failure with hypoxia [facility]) Active Problems:   Acute on chronic diastolic CHF (congestive heart failure) (HCC)   AKI (acute kidney injury) ([place])   Elevated troponin level not due myocardial infarction   Nicotine dependence, cigarettes, uncomplicated   Cirrhosis of liver (HCC)   Generalized anxiety disorder   GERD without esophagitis   Hypokalemia   Glucose intolerance   Adrenal mass (HCC)   Pleural effusion on right   Thrombocytopenia (HCC)  Resolved Problems:   * No resolved hospital problems. [facility] Course: This is a 51 year old female with COPD, anxiety, hypertension, nicotine abuse who presented to the hospital for acute on chronic shortness of breath, orthopnea, lower extremity edema and was felt to have acute congestive heart failure and diuresed with IV Lasix.    2D echo reveals an EF of 60 to 81%, grade 2 diastolic dysfunction and an RVSP of 59.  On 2/15, she underwent a CT without contrast of the chest which revealed a large right-sided pleural effusion.  On 2/16, approximately 1.3 L of fluid was removed from the right pleural space. Aafter she was felt to be adequately diuresed, cardiology was consulted for possible right heart cath.  Assessment and Plan: * Acute respiratory failure with hypoxia ([place])- (present on admission) - Multifactorial secondary to pulmonary edema and right pleural effusion and possibly underlying COPD as she is a lifelong smoker - She remains on 6 L of oxygen and chest x-ray reveals that the right lung is still collapsed-have ordered incentive spirometry and encourage ambulation - VQ scan on  2/17 neg for PE - she has had an acute increase in sputum production- PCCM has started Prednisone 20 mg and Doxy x 5 days, Anoro and ordered Scleroderma antibody & Alpha-1 antitrypsin testing - plan for outpt PFTs and sleep study  Acute on chronic diastolic CHF (congestive heart failure) ([place]) With pulmonary hypertension, RV failure and essential hypertension - Question portal pulmonary hypertension -Patient presented with several months of progressive shortness of breath, cough, abdominal distention, bilateral pedal edema, orthopnea. -Elevated BNP, chest x-ray with pulm edema -Echocardiogram with EF 60 to 65%, G2 DD, and RVSP 59  -Also continued on home meds that include metoprolol succinate 50 mg daily and Aldactone 25 mg daily. -Net IO Since Admission: -6,820.16 mL [08/09/21 1318] - transition to Lasix 40 mg daily per heart failure team - holding ACE I  AKI (acute kidney injury) ([place])- (present on admission) -Baseline creatinine not available.  Presented with creatinine elevated to 1.33.    Recent Labs    08/05/21 2054 08/06/21 0415 08/07/21 0501 08/08/21 0500 08/09/21 0455  BUN 36* 35* 24* 18 12  CREATININE 1.33* 1.31* 1.04* 0.92 0.74     Thrombocytopenia (HCC) - present on lab work on 8/16 and thus not a new finding - it is mild-follow intermittently  Adrenal mass ([place]) -CT scan from 2/15 showed an 2.1 cm left adrenal mass, 44 HU, probable benign adenoma. -Radiologist recommended adrenal washout CT or chemical shift MRI.  -Consider imaging as outpatient  Glucose intolerance- (present on admission) -History of diabetes mellitus.  A1c 5.5.  Blood sugar level  was initially elevated because of stress.  Currently running consistently less than 200. Recent Labs  Lab 08/08/21 1150 08/08/21 1612 08/08/21 2056 08/09/21 0420 08/09/21 1059  GLUCAP 156* 185* 143* 127* 142*    Hypokalemia- (present on admission) Hypomagnesemia  - replaced  GERD without esophagitis-  (present on admission) Continuing PPI daily  Generalized anxiety disorder- (present on admission) -Continuing home regimen of as needed clonazepam & Zoloft  Cirrhosis of liver ([place])- (present on admission) -It seems patient has a history of cirrhosis since 2016.    -Unclear etiology.  Denies alcohol use.  - Acute hepatitis panel negative. AMA/ASMA/ANA negative.  - ? If congestive hepatopathy from right heart failure vs NASH    Nicotine dependence, cigarettes, uncomplicated- (present on admission) -smoking 1- 1.5 PPD since age 50 - Patient has been counseled to stop smoking  Elevated troponin level not due myocardial infarction- (present on admission) -Elevated troponin level likely due to LV strain.  Downtrending.            Consultants: cardiology, PCCM Procedures performed: right heart cath  Disposition: Home Diet recommendation:  Cardiac diet  DISCHARGE MEDICATION: Allergies as of 08/12/2021       Reactions   Lexapro [escitalopram]    "Made me crazy"    Lisinopril    "Angioedema"        Medication List     STOP taking these medications    chlorthalidone 25 MG tablet Commonly known as: HYGROTON   Lexapro 10 MG tablet Generic drug: escitalopram   lisinopril 10 MG tablet Commonly known as: ZESTRIL   metoprolol tartrate 50 MG tablet Commonly known as: LOPRESSOR       TAKE these medications    acetaminophen 500 MG tablet Commonly known as: TYLENOL Take 500 mg by mouth every 6 (six) hours as needed for mild pain.   albuterol 108 (90 Base) MCG/ACT inhaler Commonly known as: VENTOLIN HFA Inhale 2 puffs into the lungs every 6 (six) hours as needed for wheezing or shortness of breath.   cholecalciferol 25 MCG (1000 UNIT) tablet Commonly known as: VITAMIN D3 Take 1,000 Units by mouth daily.   clonazePAM 0.5 MG tablet Commonly known as: KLONOPIN Take 0.5 mg by mouth 2 (two) times daily as needed for anxiety.   doxycycline 100 MG  tablet Commonly known as: ADOXA Take 1 tablet (100 mg total) by mouth 2 (two) times daily.   furosemide 40 MG tablet Commonly known as: Lasix Take 1 tablet (40 mg total) by mouth daily.   guaiFENesin 600 MG 12 hr tablet Commonly known as: MUCINEX Take 600 mg by mouth 2 (two) times daily as needed for cough.   IRON PO Take 1 tablet by mouth daily.   metoprolol succinate 50 MG 24 hr tablet Commonly known as: TOPROL-XL Take 50 mg by mouth daily.   omeprazole 20 MG capsule Commonly known as: PRILOSEC Take 20 mg by mouth daily.   potassium chloride SA 20 MEQ tablet Commonly known as: KLOR-CON M Take 1 tablet (20 mEq total) by mouth daily for 30 doses.   pravastatin 20 MG tablet Commonly known as: PRAVACHOL Take 20 mg by mouth daily.   predniSONE 20 MG tablet Commonly known as: DELTASONE Take 1 tablet (20 mg total) by mouth daily with breakfast for 3 days. Start taking on: August 13, 2021   sertraline 100 MG tablet Commonly known as: ZOLOFT Take 150 mg by mouth daily.   spironolactone 25 MG tablet Commonly known as: ALDACTONE Take 1  tablet (25 mg total) by mouth daily.   umeclidinium-vilanterol 62.5-25 MCG/ACT Aepb Commonly known as: ANORO ELLIPTA Inhale 1 puff into the lungs daily. Start taking on: August 13, 2021               Durable Medical Equipment  (From admission, onward)           Start     Ordered   08/12/21 1505  For home use only DME oxygen  Once       Question Answer Comment  Length of Need 6 Months   Mode or (Route) Nasal cannula   Liters per Minute 4   Frequency Continuous (stationary and portable oxygen unit needed)   Oxygen conserving device Yes   Oxygen delivery system Gas      08/12/21 1504            Follow-up Information      [facility]. Go to.   Specialty: Cardiology Why: Wednesday, February 22 @ 12pm for [facility] clinic within [place]. Bring all  medications with you.  FREE valet [place] at [employer], off [address] information: [address] [id number] [place] [phone]                Discharge Exam: Danley Danker Weights   08/10/21 8115 08/11/21 0305 08/12/21 0414  Weight: 85.3 kg 83.7 kg 82.2 kg     Condition at discharge: stable  The results of significant diagnostics from this hospitalization (including imaging, microbiology, ancillary and laboratory) are listed below for reference.   Imaging Studies: DG Chest 1 View  Result Date: 08/09/2021 CLINICAL DATA:  Status post right thoracentesis. EXAM: CHEST  1 VIEW COMPARISON:  08/05/2021. FINDINGS: 0953 hours. Right base collapse/consolidation with small to moderate right pleural effusion, new in the interval. No evidence for pneumothorax. Cardiopericardial silhouette is at upper limits of normal for size. Interstitial markings are diffusely coarsened. IMPRESSION: 1. New right base collapse/consolidation with small to moderate right pleural effusion. 2. No pneumothorax. Electronically Signed   By: Misty Stanley M.D.   On: 08/09/2021 10:02   DG Chest 2 View  Result Date: 08/10/2021 CLINICAL DATA:  Chest tightness and shortness of breath. EXAM: CHEST - 2 VIEW COMPARISON:  Yesterday.  CT of 08/08/2021 also reviewed. FINDINGS: Midline trachea. Cardiomegaly, accentuated by AP portable technique. Similar moderate right pleural effusion. No left effusion or pneumothorax. Moderate interstitial edema, increased. Right base airspace disease. IMPRESSION: Congestive heart failure, slightly increased. Similar moderate right pleural effusion with adjacent basilar atelectasis. Electronically Signed   By: Abigail Miyamoto M.D.   On: 08/10/2021 14:24   CT CHEST W CONTRAST  Result Date: 08/08/2021 CLINICAL DATA:  Chronic dyspnea of uncertain etiology, history COPD EXAM: CT CHEST WITH CONTRAST TECHNIQUE: Multidetector CT imaging of the chest was  performed during intravenous contrast administration. Imaging performed with patient in RIGHT lateral decubitus position. RADIATION DOSE REDUCTION: This exam was performed according to the departmental dose-optimization program which includes automated exposure control, adjustment of the mA and/or kV according to patient size and/or use of iterative reconstruction technique. CONTRAST:  76m OMNIPAQUE IOHEXOL 350 MG/ML SOLN IV COMPARISON:  02/08/2015 FINDINGS: Cardiovascular: Atherosclerotic calcifications aorta and coronary arteries. Aorta normal caliber. Heart unremarkable. No pericardial effusion. Mediastinum/Nodes: Enlarged RIGHT paratracheal lymph node 12 mm image 55. Additional scattered normal sized mediastinal nodes. Base of cervical region normal appearance. Esophagus unremarkable. Lungs/Pleura: Moderate to large RIGHT pleural effusion. Significant atelectasis  of RIGHT lung with complete atelectasis of RIGHT middle and RIGHT lower lobes and partial atelectasis of RIGHT upper lobe. Scattered infiltrate RIGHT upper lobe. LEFT lung clear. No pneumothorax. Upper Abdomen: Splenomegaly, spleen 21.0 x 5.8 x 16.0 cm (volume = 1000 cm^3). Fatty infiltration of liver. Hypervascular focus posterior RIGHT lobe liver 17 x 11 mm image 140, nonspecific. 2.1 x 1.9 cm LEFT adrenal nodule measuring 44 Hounsfield units. Remaining visualized upper abdomen unremarkable. Musculoskeletal: No osseous abnormalities. IMPRESSION: Moderate to large RIGHT pleural effusion with complete atelectasis of RIGHT middle and RIGHT lower lobes and partial atelectasis of RIGHT upper lobe. Scattered infiltrate in RIGHT upper lobe. Nonspecific enlarged RIGHT paratracheal lymph node 12 mm short axis. Splenomegaly. 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 Recommend adrenal washout CT or chemical shift MRI. JACR 2017 Aug; 14(8):1038-44, JCAT 2016 Mar-Apr; 40(2):194-200, Urol J 2006 Spring; 3(2):71-4. Fatty infiltration of liver with  nonspecific hypervascular focus posterior RIGHT lobe liver 17 x 11 mm, could represent a flash filling hemangioma or other vascular lesion, can be assessed at time of LEFT adrenal mass evaluation. Aortic Atherosclerosis (ICD10-I70.0). Electronically Signed   By: Lavonia Dana M.D.   On: 08/08/2021 15:24   CARDIAC CATHETERIZATION  Result Date: 08/10/2021 1. Borderline elevated PCWP. 2. Severe pulmonary arterial hypertension. 3. Elevated RA pressure. 4. High cardiac output. 5. PVR 3.7.  Not markedly high likely due to high cardiac output, possible that cirrhosis contributes.   NM Pulmonary Perf and Vent  Result Date: 08/10/2021 CLINICAL DATA:  PE suspected EXAM: NUCLEAR MEDICINE PERFUSION LUNG SCAN TECHNIQUE: Perfusion images were obtained in multiple projections after intravenous injection of radiopharmaceutical. Ventilation scans intentionally deferred if perfusion scan and chest x-ray adequate for interpretation during COVID 19 epidemic. RADIOPHARMACEUTICALS:  4.0 mCi Tc-45mMAA IV COMPARISON:  None. FINDINGS: Matched perfusion defect at the right lung base, in keeping with pleural effusion seen by prior radiographs. Otherwise homogeneous perfusion of the lungs without suspicious filling defect. Cardiomegaly. IMPRESSION: 1. Matched perfusion defect at the right lung base, in keeping with pleural effusion seen by prior radiographs. No suspicious perfusion defect. Very low probability examination for pulmonary embolism by modified perfusion only PIOPED criteria (PE absent). 2.  Cardiomegaly. Electronically Signed   By: ADelanna AhmadiM.D.   On: 08/10/2021 15:41   DG Chest Portable 1 View  Result Date: 08/05/2021 CLINICAL DATA:  Shortness of breath EXAM: PORTABLE CHEST 1 VIEW COMPARISON:  02/07/2015 FINDINGS: Mild cardiomegaly. Interstitial prominence may reflect early interstitial edema. No effusions or acute bony abnormality. IMPRESSION: Mild cardiomegaly and interstitial prominence which may reflect  interstitial edema. Electronically Signed   By: KRolm BaptiseM.D.   On: 08/05/2021 20:55   ECHOCARDIOGRAM COMPLETE  Result Date: 08/06/2021    ECHOCARDIOGRAM REPORT   [facility] Date of Exam: 08/06/2021 Medical Rec #:  [id number]            Height:       62.0 in Accession #:    [id number]           Weight:       190.0 lb Date of Birth:  [birth year]           BSA:          1.870 m Patient Age:    89years              BP:           101/62  mmHg Patient Gender: F                     HR:           82 bpm. Exam Location:  Inpatient Procedure: 2D Echo, Cardiac Doppler and Color Doppler Indications:    CHF  History:        Patient has no prior history of Echocardiogram examinations.                 Risk Factors:Hypertension.  Sonographer:    Jyl Heinz Referring Phys: [id number] [place]  1. Left ventricular ejection fraction, by estimation, is 60 to 65%. The left ventricle has normal function. The left ventricle has no regional wall motion abnormalities. There is mild left ventricular hypertrophy. Left ventricular diastolic parameters are consistent with Grade II diastolic dysfunction (pseudonormalization). Elevated left atrial pressure. There is the interventricular septum is flattened in systole and diastole, consistent with right ventricular pressure and volume overload.  2. Right ventricular systolic function is moderately reduced. The right ventricular size is moderately enlarged. There is moderately elevated pulmonary artery systolic pressure. The estimated right ventricular systolic pressure is 40.1 mmHg.  3. Right atrial size was severely dilated.  4. The mitral valve is normal in structure. No evidence of mitral valve regurgitation. No evidence of mitral stenosis.  5. Tricuspid valve regurgitation is mild to moderate.  6. The aortic valve is tricuspid. Aortic valve regurgitation is trivial. No aortic stenosis is present.  7. The inferior vena cava is dilated in  size with >50% respiratory variability, suggesting right atrial pressure of 8 mmHg. FINDINGS  Left Ventricle: Left ventricular ejection fraction, by estimation, is 60 to 65%. The left ventricle has normal function. The left ventricle has no regional wall motion abnormalities. The left ventricular internal cavity size was normal in size. There is  mild left ventricular hypertrophy. The interventricular septum is flattened in systole and diastole, consistent with right ventricular pressure and volume overload. Left ventricular diastolic parameters are consistent with Grade II diastolic dysfunction  (pseudonormalization). Elevated left atrial pressure. Right Ventricle: The right ventricular size is moderately enlarged. Right vetricular wall thickness was not well visualized. Right ventricular systolic function is moderately reduced. There is moderately elevated pulmonary artery systolic pressure. The tricuspid regurgitant velocity is 3.57 m/s, and with an assumed right atrial pressure of 8 mmHg, the estimated right ventricular systolic pressure is 02.7 mmHg. Left Atrium: Left atrial size was normal in size. Right Atrium: Right atrial size was severely dilated. Pericardium: Trivial pericardial effusion is present. Mitral Valve: The mitral valve is normal in structure. No evidence of mitral valve regurgitation. No evidence of mitral valve stenosis. Tricuspid Valve: The tricuspid valve is normal in structure. Tricuspid valve regurgitation is mild to moderate. Aortic Valve: The aortic valve is tricuspid. Aortic valve regurgitation is trivial. No aortic stenosis is present. Aortic valve peak gradient measures 8.2 mmHg. Pulmonic Valve: The pulmonic valve was not well visualized. Pulmonic valve regurgitation is trivial. Aorta: The aortic root and ascending aorta are structurally normal, with no evidence of dilitation. Venous: The inferior vena cava is dilated in size with greater than 50% respiratory variability, suggesting  right atrial pressure of 8 mmHg. IAS/Shunts: The interatrial septum was not well visualized.  LEFT VENTRICLE PLAX 2D LVIDd:         4.20 cm      Diastology LVIDs:         2.70 cm  LV e' medial:    4.79 cm/s LV PW:         0.90 cm      LV E/e' medial:  17.5 LV IVS:        1.00 cm      LV e' lateral:   7.72 cm/s LVOT diam:     2.00 cm      LV E/e' lateral: 10.8 LV SV:         66 LV SV Index:   35 LVOT Area:     3.14 cm  LV Volumes (MOD) LV vol d, MOD A2C: 111.0 ml LV vol d, MOD A4C: 101.0 ml LV vol s, MOD A2C: 42.1 ml LV vol s, MOD A4C: 36.6 ml LV SV MOD A2C:     68.9 ml LV SV MOD A4C:     101.0 ml LV SV MOD BP:      68.1 ml RIGHT VENTRICLE            IVC RV Basal diam:  4.40 cm    IVC diam: 2.10 cm RV Mid diam:    3.50 cm RV S prime:     9.57 cm/s TAPSE (M-mode): 1.7 cm LEFT ATRIUM             Index        RIGHT ATRIUM           Index LA diam:        3.60 cm 1.92 cm/m   RA Area:     24.40 cm LA Vol (A2C):   64.3 ml 34.38 ml/m  RA Volume:   80.40 ml  42.98 ml/m LA Vol (A4C):   47.0 ml 25.13 ml/m LA Biplane Vol: 56.9 ml 30.42 ml/m  AORTIC VALVE AV Area (Vmax): 2.31 cm AV Vmax:        143.00 cm/s AV Peak Grad:   8.2 mmHg LVOT Vmax:      105.00 cm/s LVOT Vmean:     79.300 cm/s LVOT VTI:       0.211 m  AORTA Ao Root diam: 2.90 cm Ao Asc diam:  2.80 cm MITRAL VALVE               TRICUSPID VALVE MV Area (PHT): 4.15 cm    TR Peak grad:   51.0 mmHg MV Decel Time: 183 msec    TR Vmax:        357.00 cm/s MV E velocity: 83.60 cm/s MV A velocity: 55.30 cm/s  SHUNTS MV E/A ratio:  1.51        Systemic VTI:  0.21 m                            Systemic Diam: 2.00 cm Oswaldo Milian MD Electronically signed by Oswaldo Milian MD Signature Date/Time: 08/06/2021/1:26:16 PM    Final    [place] ASCITES (ABDOMEN LIMITED)  Result Date: 08/06/2021 CLINICAL DATA:  Evaluate for ascites. EXAM: LIMITED ABDOMEN ULTRASOUND FOR ASCITES TECHNIQUE: Limited ultrasound survey for ascites was performed in all four abdominal  quadrants. COMPARISON:  Right upper quadrant ultrasound dated 08/05/2021. FINDINGS: There is a small ascites with the largest pocket in the left upper quadrant measuring approximately 5 cm deep to the peritoneal surface. IMPRESSION: Small ascites. Electronically Signed   By: Anner Crete M.D.   On: 08/06/2021 03:28   US Abdomen Limited RUQ (LIVER/GB)  Result Date: 08/05/2021 CLINICAL DATA:  Abnormal LFTs. EXAM: ULTRASOUND ABDOMEN LIMITED  RIGHT UPPER QUADRANT COMPARISON:  CTA chest 02/08/2015. FINDINGS: Gallbladder: Surgically absent. No notes were made by the tech as to whether or not there was elicited positive right upper abdominal tenderness. Common bile duct: Diameter: 5.1 mm.  No intrahepatic biliary prominence is seen. Liver: No focal lesion identified. The liver is 18.1 cm length with mild increased echogenicity of steatosis. Again, portions of the left lobe demonstrate nodular surface changes of cirrhosis, with prior CT also demonstrating splenomegaly. Portal vein is patent on color Doppler imaging with normal direction of blood flow towards the liver. The portal vein again measuring mildly prominent, 14.3 mm. Other: Small right pleural effusion. Minimal perihepatic ascites. Unremarkable visualized right kidney. IMPRESSION: 1. Mild increased hepatic echogenicity of steatosis and mild hepatic enlargement. 2. Surface nodularity over portions of the left lobe consistent with cirrhosis, was seen in 2016 also with splenomegaly. 3. Mildly prominent portal vein without flow reversal. Minimal perihepatic ascites. 4. Small right pleural effusion. Electronically Signed   By: Telford Nab M.D.   On: 08/05/2021 23:25   IR THORACENTESIS ASP PLEURAL SPACE W/IMG GUIDE  Result Date: 08/09/2021 INDICATION: Chronic dyspnea of uncertain etiology. Right pleural effusion. Request for diagnostic and therapeutic thoracentesis. EXAM: ULTRASOUND GUIDED RIGHT THORACENTESIS MEDICATIONS: 1% lidocaine 10 mL COMPLICATIONS:  None immediate. PROCEDURE: An ultrasound guided thoracentesis was thoroughly discussed with the patient and questions answered. The benefits, risks, alternatives and complications were also discussed. The patient understands and wishes to proceed with the procedure. Written consent was obtained. Ultrasound was performed to localize and mark an adequate pocket of fluid in the right chest. The area was then prepped and draped in the normal sterile fashion. 1% Lidocaine was used for local anesthesia. Under ultrasound guidance a 6 Fr Safe-T-Centesis catheter was introduced. Thoracentesis was performed. The catheter was removed and a dressing applied. FINDINGS: A total of approximately 1.3 L of clear yellow fluid was removed. Samples were sent to the laboratory as requested by the clinical team. IMPRESSION: Successful ultrasound guided right thoracentesis yielding 1.3 L of pleural fluid. Procedure performed by: Gareth Eagle, PA-C Electronically Signed   By: Miachel Roux M.D.   On: 08/09/2021 13:30    Microbiology: Results for orders placed or performed during the hospital encounter of 08/05/21  Resp Panel by RT-PCR (Flu A&B, Covid) Nasopharyngeal Swab     Status: None   Collection Time: 08/05/21  8:53 PM   Specimen: Nasopharyngeal Swab; Nasopharyngeal(NP) swabs in vial transport medium  Result Value Ref Range Status   SARS Coronavirus 2 by RT PCR NEGATIVE NEGATIVE Final    Comment: (NOTE) SARS-CoV-2 target nucleic acids are NOT DETECTED.  The SARS-CoV-2 RNA is generally detectable in upper respiratory specimens during the acute phase of infection. The lowest concentration of SARS-CoV-2 viral copies this assay can detect is 138 copies/mL. A negative result does not preclude SARS-Cov-2 infection and should not be used as the sole basis for treatment or other patient management decisions. A negative result may occur with  improper specimen collection/handling, submission of specimen other than  nasopharyngeal swab, presence of viral mutation(s) within the areas targeted by this assay, and inadequate number of viral copies(<138 copies/mL). A negative result must be combined with clinical observations, patient history, and epidemiological information. The expected result is Negative.  Fact Sheet for Patients:  EntrepreneurPulse.com.au  Fact Sheet for Healthcare Providers:  IncredibleEmployment.be  This test is no t yet approved or cleared by the [place] FDA and  has been authorized for detection and/or diagnosis  of SARS-CoV-2 by FDA under an Emergency Use Authorization (EUA). This EUA will remain  in effect (meaning this test can be used) for the duration of the COVID-19 declaration under Section 564(b)(1) of the Act, 21 U.S.C.section 360bbb-3(b)(1), unless the authorization is terminated  or revoked sooner.       Influenza A by PCR NEGATIVE NEGATIVE Final   Influenza B by PCR NEGATIVE NEGATIVE Final    Comment: (NOTE) The Xpert Xpress SARS-CoV-2/FLU/RSV plus assay is intended as an aid in the diagnosis of influenza from Nasopharyngeal swab specimens and should not be used as a sole basis for treatment. Nasal washings and aspirates are unacceptable for Xpert Xpress SARS-CoV-2/FLU/RSV testing.  Fact Sheet for Patients: EntrepreneurPulse.com.au  Fact Sheet for Healthcare Providers: IncredibleEmployment.be  This test is not yet approved or cleared by the [place] FDA and has been authorized for detection and/or diagnosis of SARS-CoV-2 by FDA under an Emergency Use Authorization (EUA). This EUA will remain in effect (meaning this test can be used) for the duration of the COVID-19 declaration under Section 564(b)(1) of the Act, 21 U.S.C. section 360bbb-3(b)(1), unless the authorization is terminated or revoked.  Performed at [facility], [place] [address] [place], [place] [address]    Acid Fast Smear (AFB)     Status: None   Collection Time: 08/09/21 10:27 AM   Specimen: Lung, Right; Pleural Fluid  Result Value Ref Range Status   AFB Specimen Processing Concentration  Final   Acid Fast Smear Negative  Final    Comment: (NOTE) Performed At: [facility] [place], [place] [id number] Rush Farmer MD AC:[id number]    Source (AFB) LUNG RIGHT  Final    Comment: Performed at [facility], [place] [address] [place], [place] [address]  Body fluid culture w Gram Stain     Status: None   Collection Time: 08/09/21 10:27 AM   Specimen: Lung, Right; Pleural Fluid  Result Value Ref Range Status   Specimen Description PLEURAL FLUID  Final   Special Requests LUNG RIGHT  Final   Gram Stain   Final    MODERATE WBC PRESENT,BOTH PMN AND MONONUCLEAR NO ORGANISMS SEEN    Culture   Final    NO GROWTH 3 DAYS Performed at [facility], [place] [address], [place], [place] [address]    Report Status 08/12/2021 FINAL  Final    Labs: CBC: Recent Labs  Lab 08/05/21 2054 08/05/21 2101 08/07/21 0501 08/08/21 0500 08/09/21 0455 08/10/21 1110 08/10/21 1111 08/10/21 1113  WBC 4.4  --  6.5 5.8 5.9  --   --   --   NEUTROABS 3.2  --  5.5 4.5 4.6  --   --   --   HGB 11.5*   < > 10.8* 10.7* 10.0* 12.9 12.9 12.6  HCT 43.3   < > 42.6 40.6 39.4 38.0 38.0 37.0  MCV 79.3*  --  81.3 80.1 81.4  --   --   --   PLT 110*  --  101* 90* 95*  --   --   --    < > = values in this interval not displayed.   Basic Metabolic Panel: Recent Labs  Lab 08/06/21 0415 08/07/21 0501 08/08/21 0500 08/09/21 0455 08/10/21 0734 08/10/21 1110 08/10/21 1111 08/10/21 1113 08/11/21 0950 08/12/21 0456  NA 134*   < > 139 140 138 137 137 139 136 136  K 3.0*   < > 3.4* 3.2* 3.6 3.7  3.7 3.6 3.8 3.5  CL 91*   < > 89* 86* 87*  --   --   --  85* 87*  CO2 32   < > 42* 45* 42*  --   --   --  40* 40*  GLUCOSE 346*   < > 159* 109* 95  --   --   --  186* 100*  BUN 35*   < > _0 --   --   --  11 13  CREATININE 1.31*   < > 0.92 0.74 0.66  --   --   --  0.82 0.73  CALCIUM 9.4   < > 9.8 9.5 9.1  --   --   --  9.5 9.1  MG 1.8  --  1.6*  --   --   --   --   --   --   --    < > = values in this interval not displayed.   Liver Function Tests: Recent Labs  Lab 08/05/21 2054 08/06/21 0415  AST 23 21  ALT 33 29  ALKPHOS 86 77  BILITOT 3.0* 2.2*  PROT 6.2* 5.8*  ALBUMIN 2.7* 2.5*   CBG: Recent Labs  Lab 08/11/21 1701 08/11/21 2108 08/12/21 0557 08/12/21 1134 08/12/21 1239  GLUCAP 210* 220* 105* 314* 290*    Discharge time spent: greater than 30 minutes.  Signed: Debbe Odea, MD [facility] 08/12/2021

## 2021-08-12 NOTE — Progress Notes (Signed)
RT came to give pt PRN Alb, pt refused and there was no wheezing present. RT will continue to monitor as needed.

## 2021-08-12 NOTE — TOC Transition Note (Signed)
Transition of Care [facility]) - CM/SW Discharge Note   Patient Details  Name: Pamela Ho MRN: [id number] Date of Birth: [birth year]  [facility]) CM/SW Contact:  Gayla Medicus., RN Phone Number: 08/12/2021, 4:07 PM   Clinical Narrative:    RNCM notified of Oxygen needs,  RNCM called Jasmine at [facility] with order and accepted, to be delivered to patient's room prior to discharge.  Patient provided with [facility] letter for medications at discharge.  Patient states she has a PCP-Dr. Albertina Senegal in [place].Patient to contact local SS office regarding disability.  All questions answered at this time.   Final next level of care: Home/Self Care Barriers to Discharge: Inadequate or no insurance   Patient Goals and CMS Choice Patient states their goals for this hospitalization and ongoing recovery are:: wants to get back to feeling better CMS Medicare.gov Compare Post Acute Care list provided to:: Patient Choice offered to / list presented to : Patient  Discharge Placement                       Discharge Plan and Services   Discharge Planning Services: CM Consult, [place] Program, Medication Assistance            DME Arranged: Oxygen DME Agency: [employer] Date DME Agency Contacted: 08/12/21 Time DME Agency Contacted: [phone] Representative spoke with at DME Agency: Delana Meyer            Social Determinants of Health ([place]) Interventions Food Insecurity Interventions: Intervention Not Indicated Financial Strain Interventions: Other (Comment) (HF CSW consulted) Housing Interventions: Intervention Not Indicated Transportation Interventions: Intervention Not Indicated   Readmission Risk Interventions No flowsheet data found.

## 2021-08-12 NOTE — Progress Notes (Signed)
SATURATION QUALIFICATIONS: (This note is used to comply with regulatory documentation for home oxygen)  Patient Saturations on Room Air at Rest = 80%  Patients saturations on 4 liters of oxygen while at rest = 94%  Patient Saturations on 4 Liters of oxygen while Ambulating = 92%  Please briefly explain why patient needs home oxygen:  Patient requires supplemental oxygen at rest and ambulating to maintain saturations greater than 88%

## 2021-08-12 NOTE — Progress Notes (Signed)
Patient ID: Pamela Ho, female   DOB: 1970-11-09, 51 y.o.   MRN: [id number]     Advanced Heart Failure Rounding Note  PCP-Cardiologist: None   Subjective:    Looks more comfortable today but still on 7L oxygen (no oxygen at home).  Good diuresis yesterday, weight down 3 lbs.   Seen by pulmonary, started on Anoro, prednisone, doxycycline for CHF exacerbation.   V/Q scan: No acute/chronic PE.   CXR (2/17): Moderate right effusion.   RHC Procedural Findings: Hemodynamics (mmHg) RA mean 12 RV 77/15 PA 778/28, mean 46 PCWP mean 15 Oxygen saturations: SVC 77% PA 71% AO 99% Cardiac Output (Fick) 8.27  Cardiac Index (Fick) 4.45 PVR 3.7 WU   Objective:   Weight Range: 82.2 kg Body mass index is 33.14 kg/m.   Vital Signs:   Temp:  [98 F (36.7 C)-98.4 F (36.9 C)] 98.4 F (36.9 C) (02/19 0600) Pulse Rate:  [66-75] 74 (02/19 0600) Resp:  [18-22] 18 (02/19 0600) BP: (96-107)/(35-63) 107/63 (02/19 0600) SpO2:  [89 %-95 %] 90 % (02/19 0818) FiO2 (%):  [50 %] 50 % (02/19 0300) Weight:  [82.2 kg] 82.2 kg (02/19 0414) Last BM Date : 08/10/21  Weight change: Filed Weights   08/10/21 0921 08/11/21 0305 08/12/21 0414  Weight: 85.3 kg 83.7 kg 82.2 kg    Intake/Output:   Intake/Output Summary (Last 24 hours) at 08/12/2021 1022 Last data filed at 08/12/2021 1000 Gross per 24 hour  Intake 600 ml  Output 1150 ml  Net -550 ml      Physical Exam    General: NAD Neck: Thick. No JVD, no thyromegaly or thyroid nodule.  Lungs: Distant BS CV: Nondisplaced PMI.  Heart regular S1/S2, no S3/S4, no murmur.  No peripheral edema.   Abdomen: Soft, nontender, no hepatosplenomegaly, no distention.  Skin: Intact without lesions or rashes.  Neurologic: Alert and oriented x 3.  Psych: Normal affect. Extremities: No clubbing or cyanosis.  HEENT: Normal.    Telemetry   NSR (personally reviewed)  Labs    CBC Recent Labs    08/10/21 1111 08/10/21 1113  HGB 12.9  12.6  HCT 38.0 79.1   Basic Metabolic Panel Recent Labs    08/11/21 0950 08/12/21 0456  NA 136 136  K 3.8 3.5  CL 85* 87*  CO2 40* 40*  GLUCOSE 186* 100*  BUN 11 13  CREATININE 0.82 0.73  CALCIUM 9.5 9.1   Liver Function Tests No results for input(s): AST, ALT, ALKPHOS, BILITOT, PROT, ALBUMIN in the last 72 hours. No results for input(s): LIPASE, AMYLASE in the last 72 hours. Cardiac Enzymes No results for input(s): CKTOTAL, CKMB, CKMBINDEX, TROPONINI in the last 72 hours.  BNP: BNP (last 3 results) Recent Labs    08/05/21 2054  BNP 550.9*    ProBNP (last 3 results) No results for input(s): PROBNP in the last 8760 hours.   D-Dimer No results for input(s): DDIMER in the last 72 hours. Hemoglobin A1C No results for input(s): HGBA1C in the last 72 hours. Fasting Lipid Panel No results for input(s): CHOL, HDL, LDLCALC, TRIG, CHOLHDL, LDLDIRECT in the last 72 hours. Thyroid Function Tests No results for input(s): TSH, T4TOTAL, T3FREE, THYROIDAB in the last 72 hours.  Invalid input(s): FREET3  Other results:   Imaging    No results found.   Medications:     Scheduled Medications:  enoxaparin (LOVENOX) injection  40 mg Subcutaneous Q24H   fluticasone  1-2 spray Each Nare Daily  influenza vac split quadrivalent PF  0.5 mL Intramuscular Tomorrow-1000   insulin aspart  0-5 Units Subcutaneous QHS   insulin aspart  0-9 Units Subcutaneous TID WC   metoprolol succinate  50 mg Oral Daily   pantoprazole  40 mg Oral Daily   potassium chloride  40 mEq Oral Once   pravastatin  20 mg Oral Daily   predniSONE  20 mg Oral Q breakfast   sertraline  150 mg Oral Daily   spironolactone  25 mg Oral Daily   umeclidinium-vilanterol  1 puff Inhalation Daily    Infusions:   PRN Medications: acetaminophen **OR** acetaminophen, albuterol, clonazePAM, guaiFENesin, hydrALAZINE, lactulose, ondansetron **OR** ondansetron (ZOFRAN) IV, phenylephrine, polyethylene  glycol   Assessment/Plan   1. Diastolic HF/Pulmonary hypertension/RV failure: Patient presented with CHF.  Admission echo showed EF 60-65%, moderately dilated and moderately dysfunctional RV, D-shaped septum, PASP 59 mmHg, and severe RAE (no prior).  RHC today after diuresis showed borderline elevated PCWP with severe pulmonary hypertension and elevated RA pressure.  Cause of pulmonary hypertension/RV failure is uncertain.  PVR was elevated but not markedly so at 3.7 WU due to high cardiac output that may be related to cirrhosis.  It is certainly possible that the patient could have portopulmonary hypertension (form of group 1 PH) in setting of cirrhosis. She is a smoker carrying a history of COPD, suspect component of group 3 PH.  However, CT chest does not seem to have extensive lung parenchymal disease => she was seen by pulmonary, thought to have emphysema on chest CT. V/Q scan not suggestive of chronic PE.  She does not look volume overloaded at this point.  - Stop IV Lasix, start Lasix 40 mg po daily tomorrow. Replace K.  - Needs full serologic workup for group 1 PH.  HIV and ANA were negative.  Pending RF, anti-centromere Ab, and anti-SCL70.  - Will need eventual sleep study.  - Will need PFTs when recovered from exacerbation.  - She is a heavy smoker who carries history of COPD, emphysema on CT per pulmonary.  There is certainly a group 3 PH component.  However, there may be a component of group 1 PH (portopulmonary hypertension). Would consider Tyvaso DPI trial as outpatient (hopefully avoid worsening of V/Q mismatch with parenchymal lung disease as inhaled therapy).  2. Cirrhosis: Cause is uncertain.  Noted by imaging back in 2016.  She has evidence for both cirrhosis and splenomegaly on imaging this admission.  Tbili elevated, albumin low.  NH3 elevated at 51.  Viral hepatitis serologies were negative.  She denies ETOH use.  ?NAFLD with progression to cirrhosis, also could be related to RV  failure (though I am concerned cirrhosis actually came first and led to portopulmonary hypertension).  3. Pleural effusion: On right, s/p thoracentesis.  ?Due to CHF versus cirrhosis. Transudate.  Has residual moderate effusion by 2/17 CXR.  4. Acute hypoxemic respiratory failure: In setting of possible COPD exacerbation as well as RV failure/pulmonary hypertension. She remains on 7L oxygen. Now looks euvolemic.  - Transition to po diuretics - Started on treatment for COPD exacerbation by pulmonary with prednisone, Anoro, doxycycline.    Length of Stay: 6  Loralie Champagne, MD  08/12/2021, 10:22 AM  Advanced Heart Failure Team Pager [phone] (M-F; 7a - 5p)  Please contact [facility] for night-coverage after hours (5p -7a ) and weekends on [email or web address]

## 2021-08-13 ENCOUNTER — Other Ambulatory Visit (HOSPITAL_COMMUNITY): Payer: Self-pay

## 2021-08-14 LAB — CENTROMERE ANTIBODIES: Centromere Ab Screen: <0.2 AI (ref 0.0–0.9)

## 2021-08-14 LAB — ANTI-SCLERODERMA ANTIBODY: Scleroderma (Scl-70) (ENA) Antibody, IgG: <0.2 AI (ref 0.0–0.9)

## 2021-08-15 ENCOUNTER — Encounter (HOSPITAL_COMMUNITY): Payer: No Typology Code available for payment source

## 2021-08-15 ENCOUNTER — Encounter (HOSPITAL_COMMUNITY): Payer: Self-pay

## 2021-08-15 ENCOUNTER — Other Ambulatory Visit: Payer: Self-pay

## 2021-08-15 ENCOUNTER — Telehealth (HOSPITAL_COMMUNITY): Payer: Self-pay | Admitting: Vascular Surgery

## 2021-08-15 ENCOUNTER — Ambulatory Visit (HOSPITAL_COMMUNITY): Admission: RE | Admit: 2021-08-15 | Payer: MEDICAID | Source: Ambulatory Visit

## 2021-08-15 NOTE — Telephone Encounter (Signed)
Left pt message to get appt resh w/ app/np

## 2021-08-16 ENCOUNTER — Telehealth (HOSPITAL_COMMUNITY): Payer: Self-pay | Admitting: Pharmacist

## 2021-08-16 NOTE — Telephone Encounter (Signed)
Received message from Dr. Aundra Dubin that patient needs to be started on Tyvaso. Called patient and LVM asking for her to return my call so enrollment process can begin.   Pamela Ho, PharmD, BCPS, BCCP, CPP Heart Failure Clinic Pharmacist [phone]

## 2021-08-20 LAB — ALPHA-1-ANTITRYPSIN PHENOTYP: A-1 Antitrypsin, Ser: 240 mg/dL — ABNORMAL HIGH (ref 101–187)

## 2021-08-21 LAB — MISC LABCORP TEST (SEND OUT): Labcorp test code: 9985

## 2021-08-29 NOTE — Progress Notes (Signed)
PCP: Dr Albertina Senegal Primary Cardiologist: Dr Aundra Dubin   HPI: Ms Pamela Ho is a 51 y.o. with history of HTN, COPD/smoking 1.5 ppd until about 2 wks ago, GERD, and cirrhosis noted since 2016 by imaging though patient unaware.  She reports several months of worsening exertional dyspnea.  The week prior to admission, she became short of breath with any exertion and had productive cough.    Admitted 08/05/21 with A/C HFpEF. Diuresed with IV lasix. Echo showed EF 60-65%, moderately dilated and moderately dysfunctional RV, D-shaped septum, PASP 59 mmHg, and severe RAE.  Abdominal US showed cirrhosis with minimal ascites and splenomegaly was noted by CT.  NH3 was elevated so lactulose was begun initially.   CT chest showed splenomegaly with large right effusion, right paratracheal node enlarged, right-sided atelectasis, and scattered infiltration RUL.  She had a right thoracentesis with 1.3 L off.  HCV/HBV/HAV serologies negative.Had [place] with  severe pulmonary hypertension, high cardiac output, and PVR 3.7. Plan to start Tyvasso once approved. Discharge weight 181 pounds.    Today she present for post hospital follow up with her husband and daughter. Overwhelmed with recent health issues.  SOB with exertion. Denies PND/Orthopnea. Denies syncope. Remains on 2 liters . Appetite ok. Snacks on chips and has been eating out. No fever or chills. She has not weighing at home. Taking all medications. She is uninsured.   Cardiac Testing  RHC 08/10/21  Hemodynamics (mmHg) RA mean 12 RV 77/15 PA 78/28, mean 46 PCWP mean 15 Oxygen saturations: SVC 77% PA 71% AO 99% Cardiac Output (Fick) 8.27  Cardiac Index (Fick) 4.45 PVR 3.7 WU  Echo 08/06/21 EF 60-65% RV moderately reduced . Grade II DD.   ROS: All systems negative except as listed in HPI, PMH and Problem List.  SH:  Social History   Socioeconomic History   Marital status: Married    Spouse name: Joslin Doell   Number of children: 2   Years of education: Not  on file   Highest education level: Some college, no degree  Occupational History   Occupation: unemployed  Tobacco Use   Smoking status: Every Day    Packs/day: 1.50    Years: 30.00    Pack years: 45.00    Types: Cigarettes   Smokeless tobacco: Never  Vaping Use   Vaping Use: Never used  Substance and Sexual Activity   Alcohol use: No    Alcohol/week: 0.0 standard drinks   Drug use: No   Sexual activity: Not on file  Other Topics Concern   Not on file  Social History Narrative   Not on file   Social Determinants of Health   Financial Resource Strain: High Risk   Difficulty of Paying Living Expenses: Hard  Food Insecurity: No Food Insecurity   Worried About Running Out of Food in the Last Year: Never true   Ran Out of Food in the Last Year: Never true  Transportation Needs: No Transportation Needs   Lack of Transportation (Medical): No   Lack of Transportation (Non-Medical): No  Physical Activity: Not on file  Stress: Not on file  Social Connections: Not on file  Intimate Partner Violence: Not on file    FH:  Family History  Problem Relation Age of Onset   Diabetes Mother    Heart disease Mother    Hyperlipidemia Mother    Diabetes Father    Cancer Father    Heart disease Father    Hyperlipidemia Father    Hypertension Father  Heart disease Maternal Grandmother    Hyperlipidemia Maternal Grandmother    Hypertension Maternal Grandmother    Heart disease Maternal Grandfather    Hyperlipidemia Maternal Grandfather    Hypertension Maternal Grandfather    Heart disease Paternal Grandmother    Hyperlipidemia Paternal Grandmother    Hypertension Paternal Grandmother    Stroke Paternal Grandmother    Diabetes Paternal Grandmother    Cancer Paternal Grandmother    Breast cancer Neg Hx     Past Medical History:  Diagnosis Date   Acute congestive heart failure ([place]) 08/05/2021   Anemia    Anxiety    COPD (chronic obstructive pulmonary disease) (HCC)     Depression    GERD (gastroesophageal reflux disease)    Heart murmur    Hypertension     Current Outpatient Medications  Medication Sig Dispense Refill   acetaminophen (TYLENOL) 500 MG tablet Take 500 mg by mouth every 6 (six) hours as needed for mild pain.     albuterol (VENTOLIN HFA) 108 (90 Base) MCG/ACT inhaler Inhale 2 puffs into the lungs every 6 (six) hours as needed for wheezing or shortness of breath. 8 g 2   cholecalciferol (VITAMIN D3) 25 MCG (1000 UNIT) tablet Take 1,000 Units by mouth daily.     clonazePAM (KLONOPIN) 0.5 MG tablet Take 0.5 mg by mouth 2 (two) times daily as needed for anxiety.     furosemide (LASIX) 40 MG tablet Take 1 tablet (40 mg total) by mouth daily. 30 tablet 11   guaiFENesin (MUCINEX) 600 MG 12 hr tablet Take 600 mg by mouth 2 (two) times daily as needed for cough.     metoprolol succinate (TOPROL-XL) 50 MG 24 hr tablet Take 50 mg by mouth daily.     omeprazole (PRILOSEC) 20 MG capsule Take 20 mg by mouth daily.     potassium chloride SA (KLOR-CON M) 20 MEQ tablet Take 1 tablet (20 mEq total) by mouth daily for 30 doses. 30 tablet 0   pravastatin (PRAVACHOL) 20 MG tablet Take 20 mg by mouth daily.     sertraline (ZOLOFT) 100 MG tablet Take 150 mg by mouth daily.     spironolactone (ALDACTONE) 25 MG tablet Take 1 tablet (25 mg total) by mouth daily. 30 tablet 0   umeclidinium-vilanterol (ANORO ELLIPTA) 62.5-25 MCG/ACT AEPB Inhale 1 puff into the lungs daily. (Patient taking differently: Inhale 1 puff into the lungs daily as needed.) 1 each 0   No current facility-administered medications for this encounter.    Vitals:   08/30/21 1400  BP: 124/70  Pulse: 74  SpO2: 98%  Weight: 78.7 kg   Wt Readings from Last 3 Encounters:  08/30/21 78.7 kg  08/12/21 82.2 kg  02/09/15 97.6 kg    ReDS Vest / Clip - 08/30/21 1500       ReDS Vest / Clip   Station Marker A    Ruler Value 33    ReDS Value Range High volume overload    ReDS Actual Value 40     Anatomical Comments sitting             PHYSICAL EXAM: General:  No resp difficulty HEENT: normal Neck: supple. JVP 9-10t. Carotids 2+ bilaterally; no bruits. No lymphadenopathy or thryomegaly appreciated. Cor: PMI normal. Regular rate & rhythm. No rubs, gallops or murmurs. Lungs: Decreased RLL. Clear  on 2 liters [place]  Abdomen: soft, nontender, nondistended. No hepatosplenomegaly. No bruits or masses. Good bowel sounds. Extremities: no cyanosis, clubbing, rash, edema  Neuro: alert & orientedx3, cranial nerves grossly intact. Moves all 4 extremities w/o difficulty. Affect pleasant.   ASSESSMENT & PLAN: 1. Chronic HFpEF  Diastolic HF/Pulmonary hypertension/RV failure:   Admission echo showed EF 60-65%, moderately dilated and moderately dysfunctional RV, D-shaped septum, PASP 59 mmHg, and severe RAE (no prior).  RHC after diuresis showed borderline elevated PCWP with severe pulmonary hypertension and elevated RA pressure.  Cause of pulmonary hypertension/RV failure is uncertain.  PVR was elevated but not markedly so at 3.7 WU due to high cardiac output that may be related to cirrhosis.  It is certainly possible that the patient could have portopulmonary hypertension (form of group 1 PH) in setting of cirrhosis. She was a heavy smoker carrying a history of COPD, suspect component of group 3 PH.  However, CT chest does not seem to have extensive lung parenchymal disease => she was seen by pulmonary, thought to have emphysema on chest CT. V/Q scan not suggestive of chronic PE.    - Needs full serologic workup for group 1 PH.  HIV and ANA were negative.   - RF > 650 , anti-SCL70 negative  - Will need eventual sleep study but uninsured.  - Will need PFTs when recovered from exacerbation.  - She was a heavy smoker who carries history of COPD, emphysema on CT per pulmonary.  There is certainly a group 3 PH component.  However, there may be a component of group 1 PH (portopulmonary hypertension).  Would  consider Tyvaso DPI trial as outpatient (hopefully avoid worsening of V/Q mismatch with parenchymal lung disease as inhaled therapy).  Our pharmacy team has started process with [facility] and [facility]. Start Tyvaso once approved.  NYHA III. Volume status elevated. Increase lasix 40 mg twice a day x 2 days then back to 40 mg daily.  Discussed low salt food choices and limiting fluid intake to < 2 liters per day.  - Check BMEt today.  2. Cirrhosis: Cause is uncertain.  Noted by imaging back in 2016.  She has evidence for both cirrhosis and splenomegaly on imaging  recent admit.  Tbili elevated, albumin low.  NH3 elevated at 51.  Viral hepatitis serologies were negative.  She denies ETOH use.  ?NAFLD with progression to cirrhosis, also could be related to RV failure (though there was concern cirrhosis actually came first and led to portopulmonary hypertension).  3. Pleural effusion: On right, s/p thoracentesis.  ?Due to CHF versus cirrhosis. Transudate.  Has residual moderate effusion by 2/17 CXR.  - Clear on exam today.  4. Chronic  hypoxemic respiratory failure: In setting of possible COPD exacerbation as well as RV failure/pulmonary hypertension. She remains on 2L oxygen. - We order pulse ox and send to her house.   Referred to HFSW and HF Paramedicine. Need to work on disability.   Follow up with Dr Aundra Dubin in 4 weeks.   Gizelle Whetsel NP-C  2:23 PM

## 2021-08-30 ENCOUNTER — Ambulatory Visit (HOSPITAL_COMMUNITY)
Admission: RE | Admit: 2021-08-30 | Discharge: 2021-08-30 | Disposition: A | Discharge status: Home / Self Care | Payer: Self-pay | Source: Ambulatory Visit | Attending: Family Medicine | Admitting: Family Medicine

## 2021-08-30 ENCOUNTER — Telehealth (HOSPITAL_COMMUNITY): Payer: Self-pay | Admitting: Pharmacist

## 2021-08-30 ENCOUNTER — Other Ambulatory Visit: Payer: Self-pay

## 2021-08-30 ENCOUNTER — Encounter (HOSPITAL_COMMUNITY): Payer: Self-pay

## 2021-08-30 VITALS — BP 124/70 | HR 74 | Wt 173.4 lb

## 2021-08-30 DIAGNOSIS — K7460 Unspecified cirrhosis of liver: Secondary | ICD-10-CM | POA: Insufficient documentation

## 2021-08-30 DIAGNOSIS — R161 Splenomegaly, not elsewhere classified: Secondary | ICD-10-CM | POA: Insufficient documentation

## 2021-08-30 DIAGNOSIS — J9611 Chronic respiratory failure with hypoxia: Secondary | ICD-10-CM

## 2021-08-30 DIAGNOSIS — I272 Pulmonary hypertension, unspecified: Secondary | ICD-10-CM | POA: Insufficient documentation

## 2021-08-30 DIAGNOSIS — R0602 Shortness of breath: Secondary | ICD-10-CM

## 2021-08-30 DIAGNOSIS — F17210 Nicotine dependence, cigarettes, uncomplicated: Secondary | ICD-10-CM | POA: Insufficient documentation

## 2021-08-30 DIAGNOSIS — J449 Chronic obstructive pulmonary disease, unspecified: Secondary | ICD-10-CM | POA: Insufficient documentation

## 2021-08-30 DIAGNOSIS — K219 Gastro-esophageal reflux disease without esophagitis: Secondary | ICD-10-CM | POA: Insufficient documentation

## 2021-08-30 DIAGNOSIS — I2721 Secondary pulmonary arterial hypertension: Secondary | ICD-10-CM

## 2021-08-30 DIAGNOSIS — I110 Hypertensive heart disease with heart failure: Secondary | ICD-10-CM | POA: Insufficient documentation

## 2021-08-30 DIAGNOSIS — I5032 Chronic diastolic (congestive) heart failure: Secondary | ICD-10-CM | POA: Insufficient documentation

## 2021-08-30 DIAGNOSIS — Z79899 Other long term (current) drug therapy: Secondary | ICD-10-CM | POA: Insufficient documentation

## 2021-08-30 DIAGNOSIS — I509 Heart failure, unspecified: Secondary | ICD-10-CM

## 2021-08-30 LAB — BASIC METABOLIC PANEL
Anion gap: 11 (ref 5–15)
BUN: 16 mg/dL (ref 6–20)
CO2: 31 mmol/L (ref 22–32)
Calcium: 9.7 mg/dL (ref 8.9–10.3)
Chloride: 99 mmol/L (ref 98–111)
Creatinine, Ser: 0.89 mg/dL (ref 0.44–1.00)
GFR, Estimated: >60 mL/min (ref >60)
Glucose, Bld: 193 mg/dL — ABNORMAL HIGH (ref 70–99)
Potassium: 3.7 mmol/L (ref 3.5–5.1)
Sodium: 141 mmol/L (ref 135–145)

## 2021-08-30 NOTE — Progress Notes (Signed)
ReDS Vest / Clip - 08/30/21 1500   ? ?  ? ReDS Vest / Clip  ? Station Marker A   ? Ruler Value 33   ? ReDS Value Range High volume overload   ? ReDS Actual Value 40   ? Anatomical Comments sitting   ? ?  ?  ? ?  ? ? ?

## 2021-08-30 NOTE — Progress Notes (Signed)
Paramedicine Initial Assessment: ? ?Housing:  ?In what kind of housing do you live? House/apt/trailer/shelter? house ? ?Do you rent/pay a mortgage/own? Renting to own ? ?Do you live with anyone? Spouse and dtr ? ?Social:  ?What is your current marital status? married ? ?Do you have any children? Dtr and step dtr ? ?Do you have family or friends who live locally? Main support is spouse and dtr but mom also lives locally  ? ?Food:  ?Used to get food stamps but lapsed due to missed recertification- will plan to go to [facility] to complete food stamp application and medicaid application ? ?Income:  ?What is your current source of income? Only income in the house is from spouses disability which is about $1,400/month ? ?CSW completed [facility] application with pt and turned in ? ?Do you have outstanding medical bills? Yes- provided CAFA ? ?Insurance:  ?Are you currently insured? Not at this time- encouraged pt to apply for Medicaid when she goes to do [employer] application.   ? ?Do you have prescription coverage? No- informed her of HF fund and [facility]- application mailed  ? ? ?Transportation:  ?Do you have transportation to your medical appointments? Yes owns a car no concerns getting to and from appts ? ? Daily Health Needs: ?Do you have a working scale at home? yes ? ?How do you manage your medications at home? Out of the bottle ? ?Do you ever take your medications differently than prescribed? no ? ?Do you have issues affording your medications? Not sure how expensive they will be at this time ? ?Do you have any concerns with mobility at home? no ? ?Do you use any assistive devices at home or have PCS at home? no ? ?Do you have a PCP? Dr. Albertina [place] ? ?Do you have any trouble reading or writing? no ? ?Are there any additional barriers you see to getting the care you need? None at this time ? ?CSW will continue to follow through paramedicine program and assist as needed. ? ?Jorge Ny,  LCSW ?Clinical Social Worker ?[facility] ?Desk#: [phone] ?Cell#: [phone] ? ?

## 2021-08-30 NOTE — Patient Instructions (Addendum)
Please be sure to answer a call from [place] ([place]) or [facility] to proceed with tyvasso start. ? ?INCREASE Lasix to 40 mg twice a day for 2 days, then resume normal dose of 40 mg daily thereafter ? ? ?You have been referred to [facility] for further CHF management in the comfort of your home. A team member will be in contact with you to arrange a home visit ? ?Labs today ?We will only contact you if something comes back abnormal or we need to make some changes. ?Otherwise no news is good news! ? ?Your physician recommends that you schedule a follow-up appointment in: 4-6 weeks with Dr Aundra Dubin ? ?Marland KitchenDo the following things EVERYDAY: ?Weigh yourself in the morning before breakfast. Write it down and keep it in a log. ?Take your medicines as prescribed ?Eat low salt foods--Limit salt (sodium) to 2000 mg per day.  ?Stay as active as you can everyday ?Limit all fluids for the day to less than 2 liters ? ? ?

## 2021-08-30 NOTE — Telephone Encounter (Signed)
Sent in enrollment application to [employer] for Tyvaso DPI.  ? ?Application pending, will continue to follow. ? ? ?Audry Riles, PharmD, BCPS, BCCP, CPP ?Heart Failure Clinic Pharmacist ?[phone] ? ?

## 2021-09-03 ENCOUNTER — Telehealth (HOSPITAL_COMMUNITY): Payer: Self-pay | Admitting: Licensed Clinical Social Worker

## 2021-09-03 NOTE — Telephone Encounter (Signed)
CSW consulted to order pulse ox for patient- spoke with pt who is agreeable to this and confirms shipping addressed- ordered to pt home through [employer] program ? ?Jorge Ny, LCSW ?Clinical Social Worker ?[facility] ?Desk#: [phone] ?Cell#: [phone] ? ?

## 2021-09-04 ENCOUNTER — Encounter (HOSPITAL_COMMUNITY): Payer: Self-pay | Admitting: Radiology

## 2021-09-06 ENCOUNTER — Telehealth (HOSPITAL_COMMUNITY): Payer: Self-pay | Admitting: Licensed Clinical Social Worker

## 2021-09-06 NOTE — Telephone Encounter (Signed)
CSW sent message to [place] workers who screened pt out for Medicaid to have them re-evaluate based on patients severe Pulmonary Hypertension as our physician felt she should qualify for disability based on this diagnosis.  [employer] states that criteria is that pt must have PA pressure over 40 mmHG- pt mean PA was found to be 46 during hospital cath procedure so would qualify based off this finding. ? ?[employer] to reach out to pt to work on [employer]- pt updated regarding above. ? ?Jorge Ny, LCSW ?Clinical Social Worker ?[facility] ?Desk#: [phone] ?Cell#: [phone] ? ?

## 2021-09-10 ENCOUNTER — Telehealth: Payer: Self-pay | Admitting: Internal Medicine

## 2021-09-10 NOTE — Telephone Encounter (Signed)
Notified Ms Pandolfi and  Dr Karolee Ohs with Infectious disease of results of AFB smear. ?

## 2021-09-11 ENCOUNTER — Ambulatory Visit (INDEPENDENT_AMBULATORY_CARE_PROVIDER_SITE_OTHER): Payer: Self-pay | Admitting: Internal Medicine

## 2021-09-11 ENCOUNTER — Encounter: Payer: Self-pay | Admitting: Internal Medicine

## 2021-09-11 ENCOUNTER — Telehealth (HOSPITAL_COMMUNITY): Payer: Self-pay

## 2021-09-11 ENCOUNTER — Other Ambulatory Visit: Payer: Self-pay

## 2021-09-11 VITALS — BP 120/78 | HR 67 | Temp 97.0°F | Wt 174.0 lb

## 2021-09-11 DIAGNOSIS — A310 Pulmonary mycobacterial infection: Secondary | ICD-10-CM

## 2021-09-11 NOTE — Progress Notes (Signed)
? ?  ?Patient: Pamela Ho  ?DOB: 04-29-1971 ?MRN: [id number] ?PCP: Rupert Stacks, MD  ? ? ?Chief Complaint  ?Patient presents with  ? New Patient (Initial Visit)  ?  ? ?Patient Active Problem List  ? Diagnosis Date Noted  ? Heart failure, type unknown ([place]) 08/30/2021  ? Thrombocytopenia ([place]) 08/10/2021  ? Adrenal mass ([place]) 08/09/2021  ? Pleural effusion on right 08/09/2021  ? Acute respiratory failure with hypoxia ([place]) 08/07/2021  ? AKI (acute kidney injury) ([place]) 08/07/2021  ? Glucose intolerance 08/07/2021  ? Cirrhosis of liver ([place]) 08/06/2021  ? Generalized anxiety disorder 08/06/2021  ? GERD without esophagitis 08/06/2021  ? Hypokalemia 08/06/2021  ? Acute on chronic diastolic CHF (congestive heart failure) ([place]) 08/06/2021  ? Elevated troponin level not due myocardial infarction 08/05/2021  ? Nicotine dependence, cigarettes, uncomplicated 26/20/3559  ? 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 Hypertensive urgency 02/08/2015  ? Headache 02/08/2015  ?  ? ?Subjective:  ?Pamela Ho is a 51 y.o. F with PMHX as below admitted to [facility] 2/13-19 for acute respiratory failure felt to be multifactorial in the setting of pulmonary edema and underlying COPD.  ?CT showed large right pleural effusion, with complete atelectasis of right middle and lower lobe. Non-specific right paratracheal LN enlarged, scattered infiltrate RUL. Pt underwent thoracentesis. AFB smear negative with Cx+MAC Referred to ID. During hospitalization she was noted to have acute on chronic heart failure with 6.7L diuresis then transition to lasix.  ?Today: PT presnts with husband. She is on 2L o@. She reports her breathing has improved significantly since discharge.  ? ? ? ?Review of Systems  ?All other systems reviewed and are negative. ? ?Past Medical History:  ?Diagnosis Date  ? Acute congestive heart failure ([place]) 08/05/2021  ? Anemia   ? Anxiety   ? COPD (chronic obstructive pulmonary disease) ([place])   ? Depression    ? GERD (gastroesophageal reflux disease)   ? Heart murmur   ? Hypertension   ? ? ?Outpatient Medications Prior to Visit  ?Medication Sig Dispense Refill  ? acetaminophen (TYLENOL) 500 MG tablet Take 500 mg by mouth every 6 (six) hours as needed for mild pain.    ? albuterol (VENTOLIN HFA) 108 (90 Base) MCG/ACT inhaler Inhale 2 puffs into the lungs every 6 (six) hours as needed for wheezing or shortness of breath. 8 g 2  ? cholecalciferol (VITAMIN D3) 25 MCG (1000 UNIT) tablet Take 1,000 Units by mouth daily.    ? clonazePAM (KLONOPIN) 0.5 MG tablet Take 0.5 mg by mouth 2 (two) times daily as needed for anxiety.    ? furosemide (LASIX) 40 MG tablet Take 1 tablet (40 mg total) by mouth daily. 30 tablet 11  ? guaiFENesin (MUCINEX) 600 MG 12 hr tablet Take 600 mg by mouth 2 (two) times daily as needed for cough.    ? metoprolol succinate (TOPROL-XL) 50 MG 24 hr tablet Take 50 mg by mouth daily.    ? omeprazole (PRILOSEC) 20 MG capsule Take 20 mg by mouth daily.    ? potassium chloride SA (KLOR-CON M) 20 MEQ tablet Take 1 tablet (20 mEq total) by mouth daily for 30 doses. 30 tablet 0  ? pravastatin (PRAVACHOL) 20 MG tablet Take 20 mg by mouth daily.    ? sertraline (ZOLOFT) 100 MG tablet Take 150 mg by mouth daily.    ? spironolactone (ALDACTONE) 25 MG tablet Take 1 tablet (25 mg total) by mouth daily. 30 tablet 0  ?  umeclidinium-vilanterol (ANORO ELLIPTA) 62.5-25 MCG/ACT AEPB Inhale 1 puff into the lungs daily. (Patient taking differently: Inhale 1 puff into the lungs daily as needed.) 1 each 0  ? ?No facility-administered medications prior to visit.  ?  ? ?Allergies  ?Allergen Reactions  ? Lexapro [Escitalopram]   ?  "Made me crazy"   ? Lisinopril   ?  "Angioedema"  ? ? ?Social History  ? ?Tobacco Use  ? Smoking status: Every Day  ?  Packs/day: 1.50  ?  Years: 30.00  ?  Pack years: 45.00  ?  Types: Cigarettes  ? Smokeless tobacco: Never  ?Vaping Use  ? Vaping Use: Never used  ?Substance Use Topics  ? Alcohol use: No   ?  Alcohol/week: 0.0 standard drinks  ? Drug use: No  ? ? ?Family History  ?Problem Relation Age of Onset  ? Diabetes Mother   ? Heart disease Mother   ? Hyperlipidemia Mother   ? Diabetes Father   ? Cancer Father   ? Heart disease Father   ? Hyperlipidemia Father   ? Hypertension Father   ? Heart disease Maternal Grandmother   ? Hyperlipidemia Maternal Grandmother   ? Hypertension Maternal Grandmother   ? Heart disease Maternal Grandfather   ? Hyperlipidemia Maternal Grandfather   ? Hypertension Maternal Grandfather   ? Heart disease Paternal Grandmother   ? Hyperlipidemia Paternal Grandmother   ? Hypertension Paternal Grandmother   ? Stroke Paternal Grandmother   ? Diabetes Paternal Grandmother   ? Cancer Paternal Grandmother   ? Breast cancer Neg Hx   ? ? ?Objective:  ? ?Vitals:  ? 09/11/21 1540  ?BP: 120/78  ?Pulse: 67  ?Temp: (!) 97 ?F (36.1 ?C)  ?TempSrc: Temporal  ?Weight: 174 lb (78.9 kg)  ? ?Body mass index is 31.83 kg/m?. ? ?Physical Exam ?Constitutional:   ?   Appearance: Normal appearance.  ?HENT:  ?   Head: Normocephalic and atraumatic.  ?   Right Ear: Tympanic membrane normal.  ?   Left Ear: Tympanic membrane normal.  ?   Nose: Nose normal.  ?   Mouth/Throat:  ?   Mouth: Mucous membranes are moist.  ?Eyes:  ?   Extraocular Movements: Extraocular movements intact.  ?   Conjunctiva/sclera: Conjunctivae normal.  ?   Pupils: Pupils are equal, round, and reactive to light.  ?Cardiovascular:  ?   Rate and Rhythm: Normal rate and regular rhythm.  ?   Heart sounds: No murmur heard. ?  No friction rub. No gallop.  ?Pulmonary:  ?   Effort: Pulmonary effort is normal.  ?   Breath sounds: Normal breath sounds.  ?Abdominal:  ?   General: Abdomen is flat.  ?   Palpations: Abdomen is soft.  ?Musculoskeletal:     ?   General: Normal range of motion.  ?Skin: ?   General: Skin is warm and dry.  ?Neurological:  ?   General: No focal deficit present.  ?   Mental Status: She is alert and oriented to person, place, and  time.  ?Psychiatric:     ?   Mood and Affect: Mood normal.  ? ? ?Lab Results: ?Lab Results  ?Component Value Date  ? WBC 5.9 08/09/2021  ? HGB 12.6 08/10/2021  ? HCT 37.0 08/10/2021  ? MCV 81.4 08/09/2021  ? PLT 95 (L) 08/09/2021  ?  ?Lab Results  ?Component Value Date  ? CREATININE 0.89 08/30/2021  ? BUN 16 08/30/2021  ? NA 141 08/30/2021  ?  K 3.7 08/30/2021  ? CL 99 08/30/2021  ? CO2 31 08/30/2021  ?  ?Lab Results  ?Component Value Date  ? ALT 29 08/06/2021  ? AST 21 08/06/2021  ? ALKPHOS 77 08/06/2021  ? BILITOT 2.2 (H) 08/06/2021  ?  ? ?Assessment & Plan:  ?#Pulmonary MAC ?-Pt's recent hospitalization for acute respiratory failure can be attributed to volume overload vs COPD exacerbation ?-We discussed that given her imaging not showing extensive nodular disease or cavitary lesion and AFB smear negative(Thoracentesis), she can hold off on treatment ?Plan: ?-Will order AFB sputum smear + culture ?-Continue to monitor clinically with AFB Cx every three months ?-Repeat imaging after six months ?-Follow-up in 3 months ? ?Laurice Record, MD ?[facility] ?[facility] ? ? ?09/11/21  ?3:47 PM  ?

## 2021-09-11 NOTE — Telephone Encounter (Signed)
Spoke to [place] and confirmed paramedicine home visit for Monday 3/27. Call complete.  ?

## 2021-09-14 LAB — ACID FAST SMEAR (AFB, MYCOBACTERIA): Acid Fast Smear: NEGATIVE

## 2021-09-14 LAB — AFB ORGANISM ID BY DNA PROBE
M avium complex: POSITIVE — AB
M tuberculosis complex: NEGATIVE

## 2021-09-14 LAB — MAC SUSCEPTIBILITY BROTH
Amikacin: 8
Ciprofloxacin: 8
Clarithromycin: 1
Doxycycline: >8
Linezolid: 8
Minocycline: >8
Rifabutin: 0.5
Rifampin: 4
Streptomycin: 16

## 2021-09-14 LAB — ACID FAST CULTURE WITH REFLEXED SENSITIVITIES (MYCOBACTERIA): Acid Fast Culture: POSITIVE — AB

## 2021-09-17 ENCOUNTER — Other Ambulatory Visit (HOSPITAL_COMMUNITY): Payer: Self-pay

## 2021-09-17 NOTE — Progress Notes (Signed)
Paramedicine Encounter ? ? ? Patient ID: Pamela Ho, female    DOB: [birth year], 51 y.o.   MRN: [id number] ? ? ?Arrived for initial home paramedicine visit. Zeya reports feeling good today denying shortness of breath, dizziness, chest pain with no lower leg swelling or abdominal distention. Lungs clear. Vitals obtained and as noted. Medications reviewed with Carrieanne and she confirmed same and that she is currently filling her own pill box and does not need assistance with this at this time but might want me to help soon. Only concern was if Potassium was to be continued after the 30 doses. I will ask triage at HF clinic.  ? ?Socially: ?She was denied Medicaid last week. She applied for disability today with [facility], they are mailing her a packet to complete and send back. I will assist her with same.  ? ?She also needs assistance applying for food stamps. She knows she has to go to [facility] for this. I will continue to follow up.  ? ?Home visit complete.  ? ?Patient Care Team: ?Rupert Stacks, MD as PCP - General (Family Medicine) ? ?Patient Active Problem List  ? Diagnosis Date Noted  ? Heart failure, type unknown ([place]) 08/30/2021  ? Thrombocytopenia ([place]) 08/10/2021  ? Adrenal mass ([place]) 08/09/2021  ? Pleural effusion on right 08/09/2021  ? Acute respiratory failure with hypoxia ([place]) 08/07/2021  ? AKI (acute kidney injury) ([place]) 08/07/2021  ? Glucose intolerance 08/07/2021  ? Cirrhosis of liver ([place]) 08/06/2021  ? Generalized anxiety disorder 08/06/2021  ? GERD without esophagitis 08/06/2021  ? Hypokalemia 08/06/2021  ? Acute on chronic diastolic CHF (congestive heart failure) ([place]) 08/06/2021  ? Elevated troponin level not due myocardial infarction 08/05/2021  ? Nicotine dependence, cigarettes, uncomplicated 79/07/4095  ? 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 Hypertensive urgency 02/08/2015  ? Headache 02/08/2015  ? ? ?Current Outpatient Medications:  ?  acetaminophen (TYLENOL) 500 MG tablet,  Take 500 mg by mouth every 6 (six) hours as needed for mild pain., Disp: , Rfl:  ?  albuterol (VENTOLIN HFA) 108 (90 Base) MCG/ACT inhaler, Inhale 2 puffs into the lungs every 6 (six) hours as needed for wheezing or shortness of breath., Disp: 8 g, Rfl: 2 ?  cholecalciferol (VITAMIN D3) 25 MCG (1000 UNIT) tablet, Take 1,000 Units by mouth daily., Disp: , Rfl:  ?  clonazePAM (KLONOPIN) 0.5 MG tablet, Take 0.5 mg by mouth 2 (two) times daily as needed for anxiety., Disp: , Rfl:  ?  furosemide (LASIX) 40 MG tablet, Take 1 tablet (40 mg total) by mouth daily., Disp: 30 tablet, Rfl: 11 ?  guaiFENesin (MUCINEX) 600 MG 12 hr tablet, Take 600 mg by mouth 2 (two) times daily as needed for cough., Disp: , Rfl:  ?  metoprolol succinate (TOPROL-XL) 50 MG 24 hr tablet, Take 50 mg by mouth daily., Disp: , Rfl:  ?  omeprazole (PRILOSEC) 20 MG capsule, Take 20 mg by mouth daily., Disp: , Rfl:  ?  potassium chloride SA (KLOR-CON M) 20 MEQ tablet, Take 1 tablet (20 mEq total) by mouth daily for 30 doses., Disp: 30 tablet, Rfl: 0 ?  pravastatin (PRAVACHOL) 20 MG tablet, Take 20 mg by mouth daily., Disp: , Rfl:  ?  sertraline (ZOLOFT) 100 MG tablet, Take 150 mg by mouth daily., Disp: , Rfl:  ?  spironolactone (ALDACTONE) 25 MG tablet, Take 1 tablet (25 mg total) by mouth daily., Disp: 30 tablet, Rfl: 0 ?  umeclidinium-vilanterol (ANORO ELLIPTA) 62.5-25 MCG/ACT AEPB,  Inhale 1 puff into the lungs daily. (Patient taking differently: Inhale 1 puff into the lungs daily as needed.), Disp: 1 each, Rfl: 0 ?Allergies  ?Allergen Reactions  ? Lexapro [Escitalopram]   ?  "Made me crazy"   ? Lisinopril   ?  "Angioedema"  ? ? ? ?Social History  ? ?Socioeconomic History  ? Marital status: Married  ?  Spouse name: Anwen Cannedy  ? Number of children: 2  ? Years of education: Not on file  ? Highest education level: Some college, no degree  ?Occupational History  ? Occupation: unemployed  ?Tobacco Use  ? Smoking status: Every Day  ?  Packs/day: 1.50  ?   Years: 30.00  ?  Pack years: 45.00  ?  Types: Cigarettes  ? Smokeless tobacco: Never  ?Vaping Use  ? Vaping Use: Never used  ?Substance and Sexual Activity  ? Alcohol use: No  ?  Alcohol/week: 0.0 standard drinks  ? Drug use: No  ? Sexual activity: Not on file  ?Other Topics Concern  ? Not on file  ?Social History Narrative  ? Not on file  ? ?Social Determinants of Health  ? ?Financial Resource Strain: High Risk  ? Difficulty of Paying Living Expenses: Hard  ?Food Insecurity: No Food Insecurity  ? Worried About Charity fundraiser in the Last Year: Never true  ? Ran Out of Food in the Last Year: Never true  ?Transportation Needs: No Transportation Needs  ? Lack of Transportation (Medical): No  ? Lack of Transportation (Non-Medical): No  ?Physical Activity: Not on file  ?Stress: Not on file  ?Social Connections: Not on file  ?Intimate Partner Violence: Not on file  ? ? ?Physical Exam ?Vitals reviewed.  ?Constitutional:   ?   Appearance: Normal appearance. She is normal weight.  ?HENT:  ?   Head: Normocephalic.  ?   Nose: Nose normal.  ?   Mouth/Throat:  ?   Mouth: Mucous membranes are moist.  ?   Pharynx: Oropharynx is clear.  ?Eyes:  ?   Conjunctiva/sclera: Conjunctivae normal.  ?   Pupils: Pupils are equal, round, and reactive to light.  ?Cardiovascular:  ?   Rate and Rhythm: Normal rate and regular rhythm.  ?   Pulses: Normal pulses.  ?   Heart sounds: Normal heart sounds.  ?Pulmonary:  ?   Effort: Pulmonary effort is normal.  ?   Breath sounds: Normal breath sounds.  ?Abdominal:  ?   General: Abdomen is flat.  ?   Palpations: Abdomen is soft.  ?Musculoskeletal:     ?   General: No swelling. Normal range of motion.  ?   Cervical back: Normal range of motion.  ?   Right lower leg: No edema.  ?   Left lower leg: No edema.  ?Skin: ?   General: Skin is warm and dry.  ?   Capillary Refill: Capillary refill takes less than 2 seconds.  ?Neurological:  ?   General: No focal deficit present.  ?   Mental Status: She is  alert. Mental status is at baseline.  ?Psychiatric:     ?   Mood and Affect: Mood normal.  ? ? ? ? ? ? ?Future Appointments  ?Date Time Provider [place]  ?10/09/2021  3:20 PM Larey Dresser, MD [facility] None  ?12/05/2021  4:15 PM Laurice Record, MD [facility]  ? ? ? ?ACTION: ?Home visit completed ? ? ? ? ? ? ?

## 2021-09-18 ENCOUNTER — Other Ambulatory Visit (HOSPITAL_COMMUNITY): Payer: Self-pay | Admitting: *Deleted

## 2021-09-18 NOTE — Telephone Encounter (Signed)
Sent in Manufacturer's Assistance application to [place] for Tyvaso DPI.  ? ? ?Audry Riles, PharmD, BCPS, BCCP, CPP ?Heart Failure Clinic Pharmacist ?[phone] ? ?

## 2021-09-18 NOTE — Telephone Encounter (Signed)
Advanced Heart Failure Patient Advocate Encounter  ? ?Patient was approved to receive Tyvaso DPI from [facility] ? ?Effective dates: 09/18/21 through up to one calendar year. ? ?Medication will be dispensed through [facility]. Called and spoke with the patient. Provided the phone number to [facility] as well. ? ?Charlann Boxer, CPhT ? ? ?

## 2021-09-19 ENCOUNTER — Other Ambulatory Visit (HOSPITAL_COMMUNITY): Payer: Self-pay | Admitting: *Deleted

## 2021-09-19 MED ORDER — POTASSIUM CHLORIDE CRYS ER 20 MEQ PO TBCR
20.0000 meq | EXTENDED_RELEASE_TABLET | Freq: Every day | ORAL | 3 refills | Status: DC
Start: 2021-09-19 — End: 2021-09-28

## 2021-09-20 ENCOUNTER — Telehealth (HOSPITAL_COMMUNITY): Payer: Self-pay | Admitting: Pharmacist

## 2021-09-20 NOTE — Telephone Encounter (Signed)
Received message from [place]: ? ?Patient Pamela Ho 10-21-1970 has been cleared for Tyvaso DPI. Nursing and pharmacy will be working with the patient to set up start of care. ? ? ?Audry Riles, PharmD, BCPS, BCCP, CPP ?Heart Failure Clinic Pharmacist ?[phone] ? ?

## 2021-09-27 ENCOUNTER — Telehealth (HOSPITAL_COMMUNITY): Payer: Self-pay

## 2021-09-27 NOTE — Telephone Encounter (Signed)
Emrey and I spoke via phone today rather than in person visit at her request. She reports feeling okay but having a lot of increased anxiety over the last week. She states that when she goes outside for a short time she feels very nervous without her oxygen. She feels as if she has become dependent on it even though she is not necessarily short of breath. She says she feels this is anxiety related and has reported this to her PCP but he does not want to assist her with obtaining any increased doses of anxiety medication.  ? ?She denied any swelling or edema reporting her weight is stable.  ? ?She says she has not received her Potassium we called [facility] on [address] and they have not received it. I requested same from HF triage CMA to resend it. Amberia made aware.  ? ?Call complete, we plan to meet in the home next week. She knows to reach out if needed.  ? ?Will route to Amy Clegg to make her aware.  ?

## 2021-09-28 ENCOUNTER — Other Ambulatory Visit (HOSPITAL_COMMUNITY): Payer: Self-pay | Admitting: *Deleted

## 2021-09-28 MED ORDER — POTASSIUM CHLORIDE CRYS ER 20 MEQ PO TBCR: 20.0000 meq | EXTENDED_RELEASE_TABLET | Freq: Every day | ORAL | 6 refills | Status: DC

## 2021-10-02 ENCOUNTER — Other Ambulatory Visit (HOSPITAL_COMMUNITY): Payer: Self-pay

## 2021-10-02 NOTE — Progress Notes (Signed)
Paramedicine Encounter ? ? ? Patient ID: Pamela Ho, female    DOB: [birth year], 50 y.o.   MRN: [id number] ? ?Arrived for home visit for Pamela Ho where she was ambulating in her kitchen alert and oriented reporting to be feeling well. She is on her home oxygen at 3LPM. She has long tubing that is getting kinked and lessening the amount of oxygen she should be getting. Also her concentrator wont go above 3LPM when she should be at 4LPM. I called [employer] and they will have a service tech come out today to get her some new shorter tubing and fix the concentrator. She was grateful for this.  ? ? ?She denied shortness of breath, chest pain or dizziness. She states when she goes outside alone she gets nervous without her oxygen and it causes her to panic. She says she doesn't get short of breath without it but feels like she has grown dependent on it psychologically. She feels like her anxiety makes her feel like she needs it 24/7. She uses a pulse oximeter to check her oxygen levels and I advised her as this is helpful it can cause more anxiety as these are not always 100% accurate. I reminded her to stay calm and really assess how she is breathing with or without the oxygen. We walked around outdoors at her home without the oxygen and she did great, no shortness of breath or fatigue noted.  ? ?Vitals and assessment as noted. No lower leg edema, she reports this is as small her legs have ever been. She reports she is urinating well.  ? ?Meds verified, she continues to fill her own pill box.  ? ?We reviewed appointments. We confirmed clinic visit next week. I plan to meet her there. She agreed.  ? ?Home visit complete. I will see Pamela Ho in clinic next week.  ? ? ?Patient Care Team: ?Rupert Stacks, MD as PCP - General (Family Medicine) ? ?Patient Active Problem List  ? Diagnosis Date Noted  ? Heart failure, type unknown ([place]) 08/30/2021  ? Thrombocytopenia ([place]) 08/10/2021  ? Adrenal mass ([place]) 08/09/2021   ? Pleural effusion on right 08/09/2021  ? Acute respiratory failure with hypoxia ([place]) 08/07/2021  ? AKI (acute kidney injury) ([place]) 08/07/2021  ? Glucose intolerance 08/07/2021  ? Cirrhosis of liver ([place]) 08/06/2021  ? Generalized anxiety disorder 08/06/2021  ? GERD without esophagitis 08/06/2021  ? Hypokalemia 08/06/2021  ? Acute on chronic diastolic CHF (congestive heart failure) ([place]) 08/06/2021  ? Elevated troponin level not due myocardial infarction 08/05/2021  ? Nicotine dependence, cigarettes, uncomplicated 34/28/7681  ? 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 Hypertensive urgency 02/08/2015  ? Headache 02/08/2015  ? ? ?Current Outpatient Medications:  ?  acetaminophen (TYLENOL) 500 MG tablet, Take 500 mg by mouth every 6 (six) hours as needed for mild pain., Disp: , Rfl:  ?  albuterol (VENTOLIN HFA) 108 (90 Base) MCG/ACT inhaler, Inhale 2 puffs into the lungs every 6 (six) hours as needed for wheezing or shortness of breath., Disp: 8 g, Rfl: 2 ?  cholecalciferol (VITAMIN D3) 25 MCG (1000 UNIT) tablet, Take 1,000 Units by mouth daily., Disp: , Rfl:  ?  clonazePAM (KLONOPIN) 0.5 MG tablet, Take 0.5 mg by mouth 2 (two) times daily as needed for anxiety., Disp: , Rfl:  ?  furosemide (LASIX) 40 MG tablet, Take 1 tablet (40 mg total) by mouth daily., Disp: 30 tablet, Rfl: 11 ?  guaiFENesin (MUCINEX) 600 MG 12 hr tablet, Take  600 mg by mouth 2 (two) times daily as needed for cough. (Patient not taking: Reported on 09/17/2021), Disp: , Rfl:  ?  metoprolol succinate (TOPROL-XL) 50 MG 24 hr tablet, Take 50 mg by mouth daily., Disp: , Rfl:  ?  omeprazole (PRILOSEC) 20 MG capsule, Take 20 mg by mouth daily., Disp: , Rfl:  ?  potassium chloride SA (KLOR-CON M) 20 MEQ tablet, Take 1 tablet (20 mEq total) by mouth daily., Disp: 30 tablet, Rfl: 6 ?  pravastatin (PRAVACHOL) 20 MG tablet, Take 20 mg by mouth daily., Disp: , Rfl:  ?  sertraline (ZOLOFT) 100 MG tablet, Take 150 mg by mouth daily., Disp: , Rfl:  ?   spironolactone (ALDACTONE) 25 MG tablet, Take 1 tablet (25 mg total) by mouth daily., Disp: 30 tablet, Rfl: 0 ?  Treprostinil (TYVASO DPI TITRATION KIT) 16 & 32 & 48 MCG POWD, Inhale into the lungs. Inhale 1 breath 4 times per day, Disp: , Rfl:  ?  umeclidinium-vilanterol (ANORO ELLIPTA) 62.5-25 MCG/ACT AEPB, Inhale 1 puff into the lungs daily. (Patient taking differently: Inhale 1 puff into the lungs daily as needed.), Disp: 1 each, Rfl: 0 ?Allergies  ?Allergen Reactions  ? Lexapro [Escitalopram]   ?  "Made me crazy"   ? Lisinopril   ?  "Angioedema"  ? ? ? ?Social History  ? ?Socioeconomic History  ? Marital status: Married  ?  Spouse name: Marena Witts  ? Number of children: 2  ? Years of education: Not on file  ? Highest education level: Some college, no degree  ?Occupational History  ? Occupation: unemployed  ?Tobacco Use  ? Smoking status: Every Day  ?  Packs/day: 1.50  ?  Years: 30.00  ?  Pack years: 45.00  ?  Types: Cigarettes  ? Smokeless tobacco: Never  ?Vaping Use  ? Vaping Use: Never used  ?Substance and Sexual Activity  ? Alcohol use: No  ?  Alcohol/week: 0.0 standard drinks  ? Drug use: No  ? Sexual activity: Not on file  ?Other Topics Concern  ? Not on file  ?Social History Narrative  ? Not on file  ? ?Social Determinants of Health  ? ?Financial Resource Strain: High Risk  ? Difficulty of Paying Living Expenses: Hard  ?Food Insecurity: No Food Insecurity  ? Worried About Charity fundraiser in the Last Year: Never true  ? Ran Out of Food in the Last Year: Never true  ?Transportation Needs: No Transportation Needs  ? Lack of Transportation (Medical): No  ? Lack of Transportation (Non-Medical): No  ?Physical Activity: Not on file  ?Stress: Not on file  ?Social Connections: Not on file  ?Intimate Partner Violence: Not on file  ? ? ?Physical Exam ?Vitals reviewed.  ?Constitutional:   ?   Appearance: Normal appearance. She is normal weight.  ?HENT:  ?   Head: Normocephalic.  ?   Nose: Nose normal.  ?    Mouth/Throat:  ?   Mouth: Mucous membranes are moist.  ?   Pharynx: Oropharynx is clear.  ?Eyes:  ?   Conjunctiva/sclera: Conjunctivae normal.  ?   Pupils: Pupils are equal, round, and reactive to light.  ?Cardiovascular:  ?   Rate and Rhythm: Normal rate and regular rhythm.  ?   Pulses: Normal pulses.  ?   Heart sounds: Normal heart sounds.  ?Pulmonary:  ?   Effort: Pulmonary effort is normal. No respiratory distress.  ?   Breath sounds: Normal breath sounds. No stridor.  No wheezing, rhonchi or rales.  ?Abdominal:  ?   General: Abdomen is flat.  ?Musculoskeletal:     ?   General: No swelling. Normal range of motion.  ?   Cervical back: Normal range of motion.  ?   Right lower leg: No edema.  ?   Left lower leg: No edema.  ?Skin: ?   General: Skin is warm and dry.  ?   Capillary Refill: Capillary refill takes less than 2 seconds.  ?Neurological:  ?   General: No focal deficit present.  ?   Mental Status: She is alert. Mental status is at baseline.  ?Psychiatric:     ?   Mood and Affect: Mood normal.  ? ? ? ? ? ? ?Future Appointments  ?Date Time Provider [place]  ?10/09/2021  3:20 PM Larey Dresser, MD [facility] None  ?12/05/2021  4:15 PM Laurice Record, MD [facility]  ? ? ? ?ACTION: ?Home visit completed ? ? ? ? ? ? ?

## 2021-10-09 ENCOUNTER — Encounter (HOSPITAL_COMMUNITY): Payer: Self-pay | Admitting: Cardiology

## 2021-10-09 ENCOUNTER — Other Ambulatory Visit (HOSPITAL_COMMUNITY): Payer: Self-pay

## 2021-10-09 ENCOUNTER — Ambulatory Visit (HOSPITAL_COMMUNITY)
Admission: RE | Admit: 2021-10-09 | Discharge: 2021-10-09 | Disposition: A | Discharge status: Home / Self Care | Payer: Medicaid Other | Source: Ambulatory Visit | Attending: Cardiology | Admitting: Cardiology

## 2021-10-09 VITALS — BP 120/60 | HR 67 | Wt 175.0 lb

## 2021-10-09 DIAGNOSIS — Z8249 Family history of ischemic heart disease and other diseases of the circulatory system: Secondary | ICD-10-CM | POA: Insufficient documentation

## 2021-10-09 DIAGNOSIS — K760 Fatty (change of) liver, not elsewhere classified: Secondary | ICD-10-CM | POA: Diagnosis not present

## 2021-10-09 DIAGNOSIS — I509 Heart failure, unspecified: Secondary | ICD-10-CM

## 2021-10-09 DIAGNOSIS — J90 Pleural effusion, not elsewhere classified: Secondary | ICD-10-CM | POA: Diagnosis not present

## 2021-10-09 DIAGNOSIS — Z79899 Other long term (current) drug therapy: Secondary | ICD-10-CM | POA: Insufficient documentation

## 2021-10-09 DIAGNOSIS — I110 Hypertensive heart disease with heart failure: Secondary | ICD-10-CM | POA: Diagnosis not present

## 2021-10-09 DIAGNOSIS — J9601 Acute respiratory failure with hypoxia: Secondary | ICD-10-CM

## 2021-10-09 DIAGNOSIS — Z9889 Other specified postprocedural states: Secondary | ICD-10-CM | POA: Insufficient documentation

## 2021-10-09 DIAGNOSIS — K7460 Unspecified cirrhosis of liver: Secondary | ICD-10-CM | POA: Diagnosis not present

## 2021-10-09 DIAGNOSIS — I272 Pulmonary hypertension, unspecified: Secondary | ICD-10-CM | POA: Diagnosis not present

## 2021-10-09 DIAGNOSIS — I5032 Chronic diastolic (congestive) heart failure: Secondary | ICD-10-CM | POA: Diagnosis not present

## 2021-10-09 DIAGNOSIS — Z87891 Personal history of nicotine dependence: Secondary | ICD-10-CM | POA: Insufficient documentation

## 2021-10-09 DIAGNOSIS — J439 Emphysema, unspecified: Secondary | ICD-10-CM | POA: Diagnosis not present

## 2021-10-09 DIAGNOSIS — I2721 Secondary pulmonary arterial hypertension: Secondary | ICD-10-CM

## 2021-10-09 DIAGNOSIS — J9611 Chronic respiratory failure with hypoxia: Secondary | ICD-10-CM | POA: Diagnosis not present

## 2021-10-09 DIAGNOSIS — M790 Rheumatism, unspecified: Secondary | ICD-10-CM

## 2021-10-09 LAB — BASIC METABOLIC PANEL
Anion gap: 11 (ref 5–15)
BUN: 14 mg/dL (ref 6–20)
CO2: 33 mmol/L — ABNORMAL HIGH (ref 22–32)
Calcium: 9.9 mg/dL (ref 8.9–10.3)
Chloride: 97 mmol/L — ABNORMAL LOW (ref 98–111)
Creatinine, Ser: 0.95 mg/dL (ref 0.44–1.00)
GFR, Estimated: >60 mL/min (ref >60)
Glucose, Bld: 155 mg/dL — ABNORMAL HIGH (ref 70–99)
Potassium: 3.2 mmol/L — ABNORMAL LOW (ref 3.5–5.1)
Sodium: 141 mmol/L (ref 135–145)

## 2021-10-09 LAB — BRAIN NATRIURETIC PEPTIDE: B Natriuretic Peptide: 332.8 pg/mL — ABNORMAL HIGH (ref 0.0–100.0)

## 2021-10-09 MED ORDER — SPIRONOLACTONE 25 MG PO TABS: 12.5000 mg | ORAL_TABLET | Freq: Every day | ORAL | 5 refills | Status: DC

## 2021-10-09 NOTE — Progress Notes (Signed)
6 Min Walk Test Completed ? ?Pt ambulated 274.3 meters  ?O2 Sat ranged 88-93%  on 3 -6L oxygen ?HR ranged 77-88 ? ?

## 2021-10-09 NOTE — Patient Instructions (Signed)
Medication Changes: ? ?Start Tyvaso as directed on kit  ? ?Lab Work: ? ?Labs done today, your results will be available in MyChart, we will contact you for abnormal readings. ? ? ?Testing/Procedures: ? ?Your physician has recommended that you have a pulmonary function test. Pulmonary Function Tests are a group of tests that measure how well air moves in and out of your lungs. ? ? ?Referrals: ? ?You have been referred to Gastroenterology. They will call you to arrange the appointment. ? ?You have been referred to Pulmonology. They will call you to arrange your appointment. ? ?You have been referred to Rheumatology. They will call you to arrange your appointment ? ?Special Instructions // Education: ? ?none ? ?Follow-Up in: 6 weeks  ? ?At the [facility], you and your health needs are our priority. We have a designated team specialized in the treatment of Heart Failure. This Care Team includes your primary Heart Failure Specialized Cardiologist (physician), Advanced Practice Providers (APPs- Physician Assistants and Nurse Practitioners), and Pharmacist who all work together to provide you with the care you need, when you need it.  ? ?You may see any of the following providers on your designated Care Team at your next follow up: ? ?Dr Glori Bickers ?Dr Loralie Champagne ?Darrick Grinder, NP ?Lyda Jester, PA ?Jessica Milford,NP ?Marlyce Huge, PA ?Audry Riles, PharmD ? ? ?Please be sure to bring in all your medications bottles to every appointment.  ? ?Need to Contact us: ? ?If you have any questions or concerns before your next appointment please send [place] a message through [place] or call our office at [phone].   ? ?TO LEAVE A MESSAGE FOR THE NURSE SELECT OPTION 2, PLEASE LEAVE A MESSAGE INCLUDING: ?YOUR NAME ?DATE OF BIRTH ?CALL BACK NUMBER ?REASON FOR CALL**this is important as we prioritize the call backs ? ?YOU WILL RECEIVE A CALL BACK THE SAME DAY AS LONG AS YOU CALL BEFORE 4:00 PM ? ? ?

## 2021-10-09 NOTE — Progress Notes (Signed)
Paramedicine Encounter ? ? ? Patient ID: Pamela Ho, female    DOB: [birth year], 51 y.o.   MRN: [id number] ? ? ?Met with Del in clinic today where she was seen by Dr. Aundra Dubin. She had no complaints today. She completed 6 min walk test on her oxygen. ? ?Todays vitals: ?WT- 175 ?BP-120/60 (R ARM) ?HR- 67 ?02-92% ? ? ?Dr. Aundra Dubin referred her to several speciality doctors today. Pending with insurance.  ? ?I will follow up with her for status of disability and medicaid apps.  ?I printed wages form for her to have her husbands employer fill out and will take it to her this evening.  ? ? ?I plan to see her in one week.  ? ?Spironolactone decreased to 12.92m  ? ? ? ? ? ?ACTION: ?Home visit completed ? ? ? ? ? ? ?

## 2021-10-10 ENCOUNTER — Telehealth (HOSPITAL_COMMUNITY): Payer: Self-pay

## 2021-10-10 DIAGNOSIS — I509 Heart failure, unspecified: Secondary | ICD-10-CM

## 2021-10-10 LAB — CYCLIC CITRUL PEPTIDE ANTIBODY, IGG/IGA: CCP Antibodies IgG/IgA: 5 units (ref 0–19)

## 2021-10-10 NOTE — Progress Notes (Signed)
?PCP: Dr Albertina Senegal ?Primary Cardiologist: Dr Aundra Dubin  ? ?HPI: ?Pamela Ho is a 51 y.o. with history of HTN, COPD/smoking 1.5-2 ppd until about 2/23, GERD, and cirrhosis noted since 2016 by imaging though patient unaware until 2/23.  She reports several months of worsening exertional dyspnea.  The week prior to admission in 2/23, she became short of breath with any exertion and had productive cough.  Admitted 08/05/21 with A/C HFpEF. Diuresed with IV lasix. Echo showed EF 60-65%, moderately dilated and moderately dysfunctional RV, D-shaped septum, PASP 59 mmHg, and severe RAE. Abdominal US showed cirrhosis with minimal ascites and splenomegaly was noted by CT.  NH3 was elevated so lactulose was begun initially.   CT chest showed splenomegaly with large right effusion, right paratracheal node enlarged, right-sided atelectasis, and scattered infiltration RUL.  She had a right thoracentesis with 1.3 L off, this fluid grew MAC.  HCV/HBV/HAV serologies negative.  Had [place] with  severe pulmonary hypertension, high cardiac output, and PVR 3.7. Discharge weight 181 pounds. She was sent home on oxygen.  ?  ?Today she present for post hospital follow up with her husband and daughter. She continues to wear 2L home oxygen.  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  She has been doing reasonably well at home.  No dyspnea walking on flat ground as long as she has her oxygen on.  She is lightheaded if she goes without oxygen.  No chest pain.  No orthopnea/PND.  BP runs low at times.  No palpitations though takes Toprol XL for h/o PVCs.  Noted to have elevated RF but no joint pain.  ? ?6 minute walk (4/23): 274 m ? ?Labs (2/23): anti-SCL 70 negative, RF > 650, anti-centromere Ab negative, HIV negative ?Labs (3/23): K 3.7, creatinine 0.89 ? ?PMH: ?1. COPD: smoked 1.5-2 ppd x years, stopped 2/23.  CT chest with emphysema.  ?2. 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  PVCs ?4. H/o cholecystectomy ?5. Cirrhosis: Suspect NAFLD.  Never a heavy drinker.  Viral hepatitis workup negative.  ?- Splenomegaly ?6. Psoriasis ?7. Pulmonary HTN/RV failure: Echo (2/23) with EF 60-65%, D-shaped septum, moderate RV enlargement with moderately decreased RV systolic function, PASP 59 mmHg.   ?- V/Q scan (2/23): No evidence for chronic PE.  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  ? ?ROS: All systems negative except as listed in HPI, PMH and Problem List. ? ?SH:  ?Social History  ? ?Socioeconomic History  ? Marital status: Married  ?  Spouse name: Clancy Leiner  ? Number of children: 2  ? Years of education: Not on file  ? Highest education level: Some college, no degree  ?Occupational History  ? Occupation: unemployed  ?Tobacco Use  ? Smoking status: Every Day  ?  Packs/day: 1.50  ?  Years: 30.00  ?  Pack years: 45.00  ?  Types: Cigarettes  ? Smokeless tobacco: Never  ?Vaping Use  ? Vaping Use: Never used  ?Substance and Sexual Activity  ? Alcohol use: No  ?  Alcohol/week: 0.0 standard drinks  ? Drug use: No  ? Sexual activity: Not on file  ?Other Topics Concern  ? Not on file  ?Social History Narrative  ? Not on file  ? ?Social Determinants of Health  ? ?Financial Resource Strain: High Risk  ?  Difficulty of Paying Living Expenses: Hard  ?Food Insecurity: No Food Insecurity  ? Worried About Charity fundraiser in the Last Year: Never true  ? Ran Out of Food in the Last Year: Never true  ?Transportation Needs: No Transportation Needs  ? Lack of Transportation (Medical): No  ? Lack of Transportation (Non-Medical): No  ?Physical Activity: Not on file  ?Stress: Not on file  ?Social Connections: Not on file  ?Intimate Partner Violence: Not on file  ? ? ?FH:  ?Family History  ?Problem Relation Age of Onset  ? Diabetes Mother   ? Heart disease Mother   ? Hyperlipidemia Mother    ? Diabetes Father   ? Cancer Father   ? Heart disease Father   ? Hyperlipidemia Father   ? Hypertension Father   ? Heart disease Maternal Grandmother   ? Hyperlipidemia Maternal Grandmother   ? Hypertension Maternal Grandmother   ? Heart disease Maternal Grandfather   ? Hyperlipidemia Maternal Grandfather   ? Hypertension Maternal Grandfather   ? Heart disease Paternal Grandmother   ? Hyperlipidemia Paternal Grandmother   ? Hypertension Paternal Grandmother   ? Stroke Paternal Grandmother   ? Diabetes Paternal Grandmother   ? Cancer Paternal Grandmother   ? Breast cancer Neg Hx   ? ?Current Outpatient Medications  ?Medication Sig Dispense Refill  ? acetaminophen (TYLENOL) 500 MG tablet Take 500 mg by mouth every 6 (six) hours as needed for mild pain.    ? albuterol (VENTOLIN HFA) 108 (90 Base) MCG/ACT inhaler Inhale 2 puffs into the lungs every 6 (six) hours as needed for wheezing or shortness of breath. 8 g 2  ? clonazePAM (KLONOPIN) 0.5 MG tablet Take 0.5 mg by mouth 2 (two) times daily as needed for anxiety.    ? furosemide (LASIX) 40 MG tablet Take 1 tablet (40 mg total) by mouth daily. 30 tablet 11  ? metoprolol succinate (TOPROL-XL) 50 MG 24 hr tablet Take 50 mg by mouth daily.    ? omeprazole (PRILOSEC) 20 MG capsule Take 20 mg by mouth daily.    ? potassium chloride SA (KLOR-CON M) 20 MEQ tablet Take 1 tablet (20 mEq total) by mouth daily. 30 tablet 6  ? pravastatin (PRAVACHOL) 20 MG tablet Take 20 mg by mouth daily.    ? sertraline (ZOLOFT) 100 MG tablet Take 150 mg by mouth daily.    ? umeclidinium-vilanterol (ANORO ELLIPTA) 62.5-25 MCG/ACT AEPB Inhale 1 puff into the lungs daily.    ? spironolactone (ALDACTONE) 25 MG tablet Take 0.5 tablets (12.5 mg total) by mouth daily. 30 tablet 5  ? Treprostinil (TYVASO DPI TITRATION KIT) 16 & 32 & 48 MCG POWD Inhale into the lungs. Inhale 1 breath 4 times per day    ? ?No current facility-administered medications for this encounter.  ? ? ?Vitals:  ? 10/09/21 1545   ?BP: 120/60  ?Pulse: 67  ?SpO2: 92%  ?Weight: 79.4 kg (175 lb)  ? ?Wt Readings from Last 3 Encounters:  ?10/09/21 79.4 kg (175 lb)  ?10/02/21 77.5 kg (170 lb 12.8 oz)  ?09/17/21 77.1 kg (170 lb)  ? ? ?PHYSICAL EXAM: ?General: NAD ?Neck: No JVD, no thyromegaly or thyroid nodule.  ?Lungs: Crackles right base.  ?CV: Nondisplaced PMI.  Heart regular S1/S2, no S3/S4, no murmur.  No peripheral edema.  No carotid bruit.  Normal pedal pulses.  ?Abdomen: Soft, nontender, no hepatosplenomegaly, no distention.  ?Skin: Intact without lesions or rashes.  ?Neurologic: Alert and oriented x 3.  ?  Psych: Normal affect. ?Extremities: No clubbing or cyanosis.  ?HEENT: Normal.  ? ?ASSESSMENT & PLAN: ?1. Pulmonary hypertension/RV failure:  Echo in 2/23 with EF 60-65%, moderately dilated and moderately dysfunctional RV, D-shaped septum, PASP 59 mmHg, and severe RAE (no prior).  RHC after diuresis showed borderline elevated PCWP with severe pulmonary hypertension and elevated RA pressure.  Cause of pulmonary hypertension/RV failure is uncertain.  PVR was elevated but not markedly so at 3.7 WU due to high cardiac output that may be related to cirrhosis.  It is certainly possible that the patient could have portopulmonary hypertension (form of group 1 PH) in setting of cirrhosis. She was a heavy smoker with COPD, suspect component of group 3 PH. However, CT chest did not seem to have extensive lung parenchymal disease => she was seen by pulmonary, thought to have emphysema on chest CT. V/Q scan not suggestive of chronic PE.  Serologic workup was negative except for significantly elevated RF.  On exam today, she is not volume overloaded.  She is on 2 L [place].  NYHA class II-III.   ?- Baseline 6 minute walk today.   ?- With elevated RF, will send off CCP antibody and refer to rheumatology.  She denies significant joint pain.   ?- Will need eventual sleep study but uninsured.  ?- Arrange for PFTs.   ?- She was a heavy smoker who carries history  of COPD, emphysema on CT per pulmonary.  There is certainly a group 3 PH component. However, there may be a component of group 1 PH (portopulmonary hypertension). We are going to start her on Tyvaso DPI (sh

## 2021-10-10 NOTE — Telephone Encounter (Addendum)
Pt aware, agreeable, and verbalized understanding ? ?Labs scheduled  ? ?----- Message from Larey Dresser, MD sent at 10/09/2021 10:04 PM EDT ----- ?Start KCl 20 daily, BMET 10 days.  ?

## 2021-10-16 ENCOUNTER — Other Ambulatory Visit (HOSPITAL_COMMUNITY): Payer: Self-pay

## 2021-10-17 ENCOUNTER — Ambulatory Visit (HOSPITAL_COMMUNITY)
Admission: RE | Admit: 2021-10-17 | Discharge: 2021-10-17 | Disposition: A | Discharge status: Home / Self Care | Payer: Medicaid Other | Source: Ambulatory Visit | Attending: Internal Medicine | Admitting: Internal Medicine

## 2021-10-17 DIAGNOSIS — I509 Heart failure, unspecified: Secondary | ICD-10-CM | POA: Diagnosis not present

## 2021-10-17 LAB — BASIC METABOLIC PANEL
Anion gap: 10 (ref 5–15)
BUN: 11 mg/dL (ref 6–20)
CO2: 29 mmol/L (ref 22–32)
Calcium: 9.5 mg/dL (ref 8.9–10.3)
Chloride: 99 mmol/L (ref 98–111)
Creatinine, Ser: 0.85 mg/dL (ref 0.44–1.00)
GFR, Estimated: >60 mL/min (ref >60)
Glucose, Bld: 136 mg/dL — ABNORMAL HIGH (ref 70–99)
Potassium: 3.3 mmol/L — ABNORMAL LOW (ref 3.5–5.1)
Sodium: 138 mmol/L (ref 135–145)

## 2021-10-22 ENCOUNTER — Other Ambulatory Visit (HOSPITAL_COMMUNITY): Payer: Self-pay

## 2021-10-22 ENCOUNTER — Telehealth (HOSPITAL_COMMUNITY): Payer: Self-pay | Admitting: Surgery

## 2021-10-22 DIAGNOSIS — I509 Heart failure, unspecified: Secondary | ICD-10-CM

## 2021-10-22 NOTE — Progress Notes (Signed)
Paramedicine Encounter ? ? ? Patient ID: Pamela Ho, female    DOB: 10/17/1970, 51 y.o.   MRN: [id number] ? ? ?Arrived for home visit with [facility] where she was seated in her recliner alert and oriented. She reports feeling okay but getting over what is to be believed an upper respiratory virus. She said last week she was very congested in her chest and her sinuses with a lot of mucus production. She states she was unsure at first if it was the Tyvaso as she just started this the week of the 17th or if it was viral. Today she is reporting feeling much better and having no shortness of breath and minimal coughing with less mucus production. She denied fever, chills or body aches.  ? ?Assessment and vitals obtained. Lungs clear on assessment. Weight and vitals within normal ranges. No lower leg swelling noted.  ? ?She reports last week she had a bout of dizziness when getting in and out of the car when it was hot outside but aside from this she reports no other symptoms.  ? ?She explained her Tyvaso regimen and this week she is on 39mg per treatment every 4 hours. Next week she will be on 423m every 4 hours and the following week she will be on 6441mwhich is the max dose every 4 hours.  ? ?She is compliant with all meds, diet and daily weigh ins.  ? ?She reports she was approved for $23 per month of Food Stamps. She reports her husbands income is around $2600/mo but this isn't enough to support her and him along with all of her current medical bills. She is upset as this is not enough to assist with getting groceries monthly but reports she will let me know if there is any insecurity about obtaining groceries at any time. I advised her to reach out to [facility] case worker at [facility] review same and to discuss status of her medicaid application and disability status. She agreed with plan and will reach out this week.  ? ?We reviewed upcoming appointments and confirmed same.  ? ?Home visit complete. I will go  out for home visit in one week.  ? ?Refills- NONE  ? ? ? ? ? ? ? ? ? ? ?Patient Care Team: ?TilRupert StacksD as PCP - General (Family Medicine) ? ?Patient Active Problem List  ? Diagnosis Date Noted  ? Rheumatism 10/09/2021  ? Heart failure, type unknown (HCCHomer Glen3/02/2022  ? Thrombocytopenia (HCCMount Dora2/17/2023  ? Adrenal mass (HCCW[place]2/16/2023  ? Pleural effusion on right 08/09/2021  ? Acute respiratory failure with hypoxia (HCCJordan Hill2/14/2023  ? AKI (acute kidney injury) (HCCWescos[place]2/14/2023  ? Glucose intolerance 08/07/2021  ? Cirrhosis of liver (HCCGypsy2/13/2023  ? Generalized anxiety disorder 08/06/2021  ? GERD without esophagitis 08/06/2021  ? Hypokalemia 08/06/2021  ? Acute on chronic diastolic CHF (congestive heart failure) (HCCPeak Place2/13/2023  ? Elevated troponin level not due myocardial infarction 08/05/2021  ? Nicotine dependence, cigarettes, uncomplicated 02/95/18/8416 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  ? Hypertensive urgency 02/08/2015  ? Headache 02/08/2015  ? ? ?Current Outpatient Medications:  ?  acetaminophen (TYLENOL) 500 MG tablet, Take 500 mg by mouth every 6 (six) hours as needed for mild pain., Disp: , Rfl:  ?  albuterol (VENTOLIN HFA) 108 (90 Base) MCG/ACT inhaler, Inhale 2 puffs into the lungs every 6 (six) hours as needed for wheezing or shortness of breath., Disp: 8 g, Rfl: 2 ?  clonazePAM (  KLONOPIN) 0.5 MG tablet, Take 0.5 mg by mouth 2 (two) times daily as needed for anxiety., Disp: , Rfl:  ?  furosemide (LASIX) 40 MG tablet, Take 1 tablet (40 mg total) by mouth daily., Disp: 30 tablet, Rfl: 11 ?  metoprolol succinate (TOPROL-XL) 50 MG 24 hr tablet, Take 50 mg by mouth daily., Disp: , Rfl:  ?  omeprazole (PRILOSEC) 20 MG capsule, Take 20 mg by mouth daily., Disp: , Rfl:  ?  potassium chloride SA (KLOR-CON M) 20 MEQ tablet, Take 1 tablet (20 mEq total) by mouth daily., Disp: 30 tablet, Rfl: 6 ?  pravastatin (PRAVACHOL) 20 MG tablet, Take 20 mg by mouth daily., Disp: , Rfl:  ?  sertraline (ZOLOFT)  100 MG tablet, Take 150 mg by mouth daily., Disp: , Rfl:  ?  spironolactone (ALDACTONE) 25 MG tablet, Take 0.5 tablets (12.5 mg total) by mouth daily., Disp: 30 tablet, Rfl: 5 ?  Treprostinil (TYVASO DPI TITRATION KIT) 16 & 32 & 48 MCG POWD, Inhale into the lungs. Inhale 1 breath 4 times per day, Disp: , Rfl:  ?  umeclidinium-vilanterol (ANORO ELLIPTA) 62.5-25 MCG/ACT AEPB, Inhale 1 puff into the lungs daily., Disp: , Rfl:  ?Allergies  ?Allergen Reactions  ? Lexapro [Escitalopram]   ?  "Made me crazy"   ? Lisinopril   ?  "Angioedema"  ? ? ? ?Social History  ? ?Socioeconomic History  ? Marital status: Married  ?  Spouse name: Zhuri Krass  ? Number of children: 2  ? Years of education: Not on file  ? Highest education level: Some college, no degree  ?Occupational History  ? Occupation: unemployed  ?Tobacco Use  ? Smoking status: Every Day  ?  Packs/day: 1.50  ?  Years: 30.00  ?  Pack years: 45.00  ?  Types: Cigarettes  ? Smokeless tobacco: Never  ?Vaping Use  ? Vaping Use: Never used  ?Substance and Sexual Activity  ? Alcohol use: No  ?  Alcohol/week: 0.0 standard drinks  ? Drug use: No  ? Sexual activity: Not on file  ?Other Topics Concern  ? Not on file  ?Social History Narrative  ? Not on file  ? ?Social Determinants of Health  ? ?Financial Resource Strain: High Risk  ? Difficulty of Paying Living Expenses: Hard  ?Food Insecurity: No Food Insecurity  ? Worried About Charity fundraiser in the Last Year: Never true  ? Ran Out of Food in the Last Year: Never true  ?Transportation Needs: No Transportation Needs  ? Lack of Transportation (Medical): No  ? Lack of Transportation (Non-Medical): No  ?Physical Activity: Not on file  ?Stress: Not on file  ?Social Connections: Not on file  ?Intimate Partner Violence: Not on file  ? ? ?Physical Exam ?Vitals reviewed.  ?Constitutional:   ?   Appearance: Normal appearance. She is normal weight.  ?HENT:  ?   Head: Normocephalic.  ?   Nose: Congestion present.  ?   Mouth/Throat:   ?   Mouth: Mucous membranes are moist.  ?   Pharynx: Oropharynx is clear.  ?Eyes:  ?   Conjunctiva/sclera: Conjunctivae normal.  ?   Pupils: Pupils are equal, round, and reactive to light.  ?Cardiovascular:  ?   Rate and Rhythm: Normal rate and regular rhythm.  ?   Pulses: Normal pulses.  ?   Heart sounds: Normal heart sounds.  ?Pulmonary:  ?   Effort: Pulmonary effort is normal. No respiratory distress.  ?  Breath sounds: Normal breath sounds. No stridor. No wheezing, rhonchi or rales.  ?Musculoskeletal:     ?   General: No swelling. Normal range of motion.  ?   Cervical back: Normal range of motion.  ?   Right lower leg: No edema.  ?   Left lower leg: No edema.  ?Skin: ?   General: Skin is warm and dry.  ?   Capillary Refill: Capillary refill takes less than 2 seconds.  ?Neurological:  ?   General: No focal deficit present.  ?   Mental Status: She is alert. Mental status is at baseline.  ?Psychiatric:     ?   Mood and Affect: Mood normal.  ? ? ? ? ? ? ?Future Appointments  ?Date Time Provider [place]  ?10/24/2021  1:00 PM MC-RESPTX TECH MC-RESPTX None  ?10/30/2021  3:00 PM Olalere, Adewale A, MD LBPU-PULCARE None  ?11/20/2021  3:20 PM Larey Dresser, MD MC-HVSC None  ?11/28/2021 10:00 AM Rice, Resa Miner, MD CR-GSO None  ?12/05/2021  4:15 PM Laurice Record, MD RCID-RCID RCID  ?12/12/2021  2:00 PM Collier Salina, MD CR-GSO None  ? ? ? ?ACTION: ?Home visit completed ? ? ? ? ? ? ?

## 2021-10-22 NOTE — Telephone Encounter (Signed)
Patient contacted to review results and recommendations per provider.  She tells me that she had just restarted her previous dosage of Potassium and feels that the level is not correct.  She requested to come have Potassium redrawn therefore scheduled an appt for tomorrow for repeat labwork.   ?

## 2021-10-22 NOTE — Telephone Encounter (Signed)
-----  Message from Larey Dresser, MD sent at 10/18/2021  9:23 PM EDT ----- ?Add additional KCl 20 mEq to regimen, BMET 10 days.  ?

## 2021-10-23 ENCOUNTER — Other Ambulatory Visit (HOSPITAL_COMMUNITY): Payer: No Typology Code available for payment source

## 2021-10-23 ENCOUNTER — Telehealth (HOSPITAL_COMMUNITY): Payer: Self-pay | Admitting: *Deleted

## 2021-10-23 NOTE — Telephone Encounter (Signed)
Pt called to r/s lab appt for next week. Pt said she just started taking 48mq of potassium and wanted to try it for a week before increasing to 481m.  ?

## 2021-10-24 ENCOUNTER — Ambulatory Visit (HOSPITAL_COMMUNITY)
Admission: RE | Admit: 2021-10-24 | Discharge: 2021-10-24 | Disposition: A | Discharge status: Home / Self Care | Payer: Medicaid Other | Source: Ambulatory Visit | Attending: Cardiology | Admitting: Cardiology

## 2021-10-24 DIAGNOSIS — J9601 Acute respiratory failure with hypoxia: Secondary | ICD-10-CM | POA: Diagnosis present

## 2021-10-24 LAB — PULMONARY FUNCTION TEST
FEF 25-75 Post: 0.95 L/sec
FEF 25-75 Pre: 0.91 L/sec
FEF2575-%Change-Post: 5 %
FEF2575-%Pred-Post: 37 %
FEF2575-%Pred-Pre: 35 %
FEV1-%Change-Post: 2 %
FEV1-%Pred-Post: 58 %
FEV1-%Pred-Pre: 56 %
FEV1-Post: 1.49 L
FEV1-Pre: 1.46 L
FEV1FVC-%Change-Post: -11 %
FEV1FVC-%Pred-Pre: 87 %
FEV6-%Change-Post: 7 %
FEV6-%Pred-Post: 71 %
FEV6-%Pred-Pre: 66 %
FEV6-Post: 2.26 L
FEV6-Pre: 2.09 L
FEV6FVC-%Change-Post: -6 %
FEV6FVC-%Pred-Post: 95 %
FEV6FVC-%Pred-Pre: 102 %
FVC-%Change-Post: 15 %
FVC-%Pred-Post: 74 %
FVC-%Pred-Pre: 64 %
FVC-Post: 2.42 L
FVC-Pre: 2.09 L
Post FEV1/FVC ratio: 62 %
Post FEV6/FVC ratio: 93 %
Pre FEV1/FVC ratio: 70 %
Pre FEV6/FVC Ratio: 100 %
RV % pred: 63 %
RV: 1.09 L
TLC % pred: 71 %
TLC: 3.42 L

## 2021-10-24 MED ORDER — ALBUTEROL SULFATE (2.5 MG/3ML) 0.083% IN NEBU
2.5000 mg | INHALATION_SOLUTION | Freq: Once | RESPIRATORY_TRACT | Status: AC
  Administered 2021-10-24: 2.5 mg via RESPIRATORY_TRACT

## 2021-10-29 ENCOUNTER — Telehealth (HOSPITAL_COMMUNITY): Payer: Self-pay

## 2021-10-29 NOTE — Telephone Encounter (Signed)
Spoke to [place] and she reports doing well this week and has no needs at this time. We plan to meet in the home next week. Call complete.  ?

## 2021-10-30 ENCOUNTER — Encounter: Payer: Self-pay | Admitting: Pulmonary Disease

## 2021-10-30 ENCOUNTER — Ambulatory Visit (HOSPITAL_COMMUNITY)
Admission: RE | Admit: 2021-10-30 | Discharge: 2021-10-30 | Disposition: A | Discharge status: Home / Self Care | Payer: Medicaid Other | Source: Ambulatory Visit | Attending: Cardiology | Admitting: Cardiology

## 2021-10-30 ENCOUNTER — Ambulatory Visit (INDEPENDENT_AMBULATORY_CARE_PROVIDER_SITE_OTHER): Payer: Self-pay | Admitting: Pulmonary Disease

## 2021-10-30 VITALS — BP 122/80 | HR 78 | Temp 98.7°F | Ht 62.0 in | Wt 171.0 lb

## 2021-10-30 DIAGNOSIS — J449 Chronic obstructive pulmonary disease, unspecified: Secondary | ICD-10-CM

## 2021-10-30 DIAGNOSIS — I509 Heart failure, unspecified: Secondary | ICD-10-CM | POA: Diagnosis present

## 2021-10-30 DIAGNOSIS — J90 Pleural effusion, not elsewhere classified: Secondary | ICD-10-CM

## 2021-10-30 DIAGNOSIS — I2729 Other secondary pulmonary hypertension: Secondary | ICD-10-CM

## 2021-10-30 LAB — BASIC METABOLIC PANEL
Anion gap: 11 (ref 5–15)
BUN: 12 mg/dL (ref 6–20)
CO2: 30 mmol/L (ref 22–32)
Calcium: 10.2 mg/dL (ref 8.9–10.3)
Chloride: 98 mmol/L (ref 98–111)
Creatinine, Ser: 1.13 mg/dL — ABNORMAL HIGH (ref 0.44–1.00)
GFR, Estimated: 59 mL/min — ABNORMAL LOW (ref >60)
Glucose, Bld: 166 mg/dL — ABNORMAL HIGH (ref 70–99)
Potassium: 4.4 mmol/L (ref 3.5–5.1)
Sodium: 139 mmol/L (ref 135–145)

## 2021-10-30 NOTE — Patient Instructions (Addendum)
Continue your oxygen supplementation ? ?Goal is to keep the oxygen above 90 ? ?You can adjust your oxygen as needed to keep it above 90 ? ?use your Anoro daily ? ?Use albuterol as needed ? ?Continue the medication for the pulmonary hypertension-Tyvaso ? ?Continue to use your oxygen at night ? ?Regular exercises as tolerated ? ?Chest x-ray possibly by next visit ? ? ?

## 2021-10-30 NOTE — Progress Notes (Signed)
? ?      ?Pamela Ho    [id number]    Jan 06, 1971 ? ?Primary Care Physician:Tiller, Geryl Rankins, MD ? ?Referring Physician: Larey Dresser, MD ?[phone] ?[address] ?[place],  [place] [address] ? ?Chief complaint:   ?Patient being seen for hypoxemia ? ?HPI: ? ?Recent diagnosis of pulmonary hypertension ? ?Recently quit smoking, was smoking over a pack a day ?Underlying history of COPD ? ?Patient with severe pulmonary hypertension, on Tyvaso ? ?Weight has continued to improve, activity levels have continued to improve ? ?History of hypertensive urgency, diastolic heart failure, large ? ?She was recently admitted to the hospital 5/63/8756 with diastolic heart failure, diuresed, echo had shown dysfunctional right ventricle for which she had right heart catheterization ? 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 ?She continues to feel better with improving activity tolerance ? ? ?Outpatient Encounter Medications as of 10/30/2021  ?Medication Sig  ? acetaminophen (TYLENOL) 500 MG tablet Take 500 mg by mouth every 6 (six) hours as needed for mild pain.  ? albuterol (VENTOLIN HFA) 108 (90 Base) MCG/ACT inhaler Inhale 2 puffs into the lungs every 6 (six) hours as needed for wheezing or shortness of breath.  ? clonazePAM (KLONOPIN) 0.5 MG tablet Take 0.5 mg by mouth 2 (two) times daily as needed for anxiety.  ? furosemide (LASIX) 40 MG tablet Take 1 tablet (40 mg total) by mouth daily.  ? metoprolol succinate (TOPROL-XL) 50 MG 24 hr tablet Take 50 mg by mouth daily.  ? omeprazole (PRILOSEC) 20 MG capsule Take 20 mg by mouth daily.  ? potassium chloride SA (KLOR-CON M) 20 MEQ tablet Take 1 tablet (20 mEq total) by mouth daily.  ? pravastatin (PRAVACHOL) 20 MG tablet Take 20 mg by mouth daily.  ? sertraline (ZOLOFT) 100 MG tablet Take 150 mg by mouth daily.  ? spironolactone (ALDACTONE) 25  MG tablet Take 0.5 tablets (12.5 mg total) by mouth daily.  ? Treprostinil (TYVASO DPI TITRATION KIT) 16 & 32 & 48 MCG POWD Inhale into the lungs. Inhale 1 breath 4 times per day  ? umeclidinium-vilanterol (ANORO ELLIPTA) 62.5-25 MCG/ACT AEPB Inhale 1 puff into the lungs daily.  ? ?No facility-administered encounter medications on file as of 10/30/2021.  ? ? ?Allergies as of 10/30/2021 - Review Complete 10/30/2021  ?Allergen Reaction Noted  ? Lexapro [escitalopram] Other (See Comments) 08/05/2021  ? Lisinopril Other (See Comments) 08/05/2021  ? ? ?Past Medical History:  ?Diagnosis Date  ? Acute congestive heart failure ([place]) 08/05/2021  ? Anemia   ? Anxiety   ? COPD (chronic obstructive pulmonary disease) ([place])   ? Depression   ? GERD (gastroesophageal reflux disease)   ? Heart murmur   ? Hypertension   ? ? ?Past Surgical History:  ?Procedure Laterality Date  ? CHOLECYSTECTOMY    ? cyst from neck    ? IR THORACENTESIS ASP PLEURAL SPACE W/IMG GUIDE  08/09/2021  ? RIGHT HEART CATH N/A 08/10/2021  ? Procedure: RIGHT HEART CATH;  Surgeon: Larey Dresser, MD;  Location: [facility];  Service: Cardiovascular;  Laterality: N/A;  ? ? ?Family History  ?Problem Relation Age of Onset  ? Diabetes Mother   ? Heart disease Mother   ? Hyperlipidemia Mother   ? Diabetes Father   ? Cancer Father   ? Heart disease Father   ? Hyperlipidemia Father   ?  Hypertension Father   ? Heart disease Maternal Grandmother   ? Hyperlipidemia Maternal Grandmother   ? Hypertension Maternal Grandmother   ? Heart disease Maternal Grandfather   ? Hyperlipidemia Maternal Grandfather   ? Hypertension Maternal Grandfather   ? Heart disease Paternal Grandmother   ? Hyperlipidemia Paternal Grandmother   ? Hypertension Paternal Grandmother   ? Stroke Paternal Grandmother   ? Diabetes Paternal Grandmother   ? Cancer Paternal Grandmother   ? Breast cancer Neg Hx   ? ? ?Social History  ? ?Socioeconomic History  ? Marital status: Married  ?  Spouse name:  Raimi Guillermo  ? Number of children: 2  ? Years of education: Not on file  ? Highest education level: Some college, no degree  ?Occupational History  ? Occupation: unemployed  ?Tobacco Use  ? Smoking status: Former  ?  Packs/day: 1.50  ?  Years: 30.00  ?  Pack years: 45.00  ?  Types: Cigarettes  ?  Quit date: 08/05/2021  ?  Years since quitting: 0.2  ? Smokeless tobacco: Never  ?Vaping Use  ? Vaping Use: Never used  ?Substance and Sexual Activity  ? Alcohol use: No  ?  Alcohol/week: 0.0 standard drinks  ? Drug use: No  ? Sexual activity: Not on file  ?Other Topics Concern  ? Not on file  ?Social History Narrative  ? Not on file  ? ?Social Determinants of Health  ? ?Financial Resource Strain: High Risk  ? Difficulty of Paying Living Expenses: Hard  ?Food Insecurity: No Food Insecurity  ? Worried About Charity fundraiser in the Last Year: Never true  ? Ran Out of Food in the Last Year: Never true  ?Transportation Needs: No Transportation Needs  ? Lack of Transportation (Medical): No  ? Lack of Transportation (Non-Medical): No  ?Physical Activity: Not on file  ?Stress: Not on file  ?Social Connections: Not on file  ?Intimate Partner Violence: Not on file  ? ? ?Review of Systems  ?Respiratory:  Positive for shortness of breath.   ? ?Vitals:  ? 10/30/21 1518  ?BP: 122/80  ?Pulse: 78  ?Temp: 98.7 ?F (37.1 ?C)  ?SpO2: 90%  ? ? ? ?Physical Exam ?Constitutional:   ?   Appearance: She is obese.  ?HENT:  ?   Head: Normocephalic.  ?   Mouth/Throat:  ?   Mouth: Mucous membranes are moist.  ?Cardiovascular:  ?   Rate and Rhythm: Normal rate and regular rhythm.  ?   Heart sounds: No murmur heard. ?  No friction rub.  ?Pulmonary:  ?   Effort: No respiratory distress.  ?   Breath sounds: No stridor. No wheezing or rhonchi.  ?Musculoskeletal:  ?   Cervical back: No rigidity.  ?Neurological:  ?   Mental Status: She is alert.  ?Psychiatric:     ?   Mood and Affect: Mood normal.  ? ? ? ?Data Reviewed: ?In-hospital CT reviewed ? ?Chest  x-ray postthoracentesis reviewed ? ?Assessment:  ?Chronic respiratory failure ? ?Heart failure with preserved ejection fraction ? ?Pulmonary hypertension ? ?Diastolic heart failure ? ?Pleural MAI infection ? ?Chronic obstructive pulmonary disease-on Anoro ? ?Plan/Recommendations: ?Continue oxygen supplementation ? ?Oximetry to guide oxygen supplementation to try and keep saturations above 90% at all times ? ?Graded exercise as tolerated ? ?For pulmonary hypertension-on Tyvaso, following up with Dr. Aundra Dubin ? ?Encouraged to use Anoro daily ? ?Rescue inhaler use as needed ? ?Continue nocturnal oxygen supplementation ? ?We will obtain a  chest x-ray as soon as able, patient had to leave early today to go to another appointment ? ? ?Sherrilyn Rist MD ?[facility] ?10/30/2021, 3:50 PM ? ?CC: Larey Dresser, MD ? ? ?

## 2021-11-05 ENCOUNTER — Other Ambulatory Visit (HOSPITAL_COMMUNITY): Payer: Self-pay

## 2021-11-05 NOTE — Progress Notes (Signed)
Paramedicine Encounter ? ? ? Patient ID: Pamela Ho, female    DOB: [birth year], 51 y.o.   MRN: [id number] ? ? ?Arrived for home visit for [facility] who reports feeling good today with no complaints. She states she has been feeling fine with no shortness of breath, dizziness or trouble taking her medicine. She increased her Tyvaso today 14mg. She denies any side effects with same other than an acute cough after use but it resolves quickly.  ? ?No lower leg swelling, weight is stable. Lungs clear. She is using her oxygen at 3LPM, has been able to go outdoors a few times with out using it and her oxygen stays above 90% with no issues.  ? ?Medications reviewed and confirmed. No refills needed.  ? ?Vitals within normal limits however different in each arm. ? ?LEFT ARM- 140/60 ?RIGHT ARM- 110/60  ? ?She reports she spoke to disability office on Friday 5/12 and they conducted a phone interview and they report the decision is pending so hopeful to hear a decision soon.  ? ?She and I discussed at length today her diet and she has been staying on track. We discussed salt substitutes and I gave her several options for this.  ? ?We reviewed upcoming appointments, I plan to see her in two weeks. Home visit complete.  ? ? ? ? ?Patient Care Team: ?TRupert Stacks MD as PCP - General (Family Medicine) ? ?Patient Active Problem List  ? Diagnosis Date Noted  ? Rheumatism 10/09/2021  ? Heart failure, type unknown ([place]wat[place] 08/30/2021  ? Thrombocytopenia (HM[place] 08/10/2021  ? Adrenal mass (HAl[place] 08/09/2021  ? Pleural effusion on right 08/09/2021  ? Acute respiratory failure with hypoxia ([place] 08/07/2021  ? AKI (acute kidney injury) ([place] 08/07/2021  ? Glucose intolerance 08/07/2021  ? Cirrhosis of liver ([place] 08/06/2021  ? Generalized anxiety disorder 08/06/2021  ? GERD without esophagitis 08/06/2021  ? Hypokalemia 08/06/2021  ? Acute on chronic diastolic CHF (congestive heart failure) ([place] 08/06/2021  ? Elevated troponin level not due  myocardial infarction 08/05/2021  ? Nicotine dependence, cigarettes, uncomplicated 074/25/9563 ? 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 Hypertensive urgency 02/08/2015  ? Headache 02/08/2015  ? ? ?Current Outpatient Medications:  ?  acetaminophen (TYLENOL) 500 MG tablet, Take 500 mg by mouth every 6 (six) hours as needed for mild pain., Disp: , Rfl:  ?  albuterol (VENTOLIN HFA) 108 (90 Base) MCG/ACT inhaler, Inhale 2 puffs into the lungs every 6 (six) hours as needed for wheezing or shortness of breath., Disp: 8 g, Rfl: 2 ?  clonazePAM (KLONOPIN) 0.5 MG tablet, Take 0.5 mg by mouth 2 (two) times daily as needed for anxiety., Disp: , Rfl:  ?  furosemide (LASIX) 40 MG tablet, Take 1 tablet (40 mg total) by mouth daily., Disp: 30 tablet, Rfl: 11 ?  metoprolol succinate (TOPROL-XL) 50 MG 24 hr tablet, Take 50 mg by mouth daily., Disp: , Rfl:  ?  omeprazole (PRILOSEC) 20 MG capsule, Take 20 mg by mouth daily., Disp: , Rfl:  ?  potassium chloride SA (KLOR-CON M) 20 MEQ tablet, Take 1 tablet (20 mEq total) by mouth daily., Disp: 30 tablet, Rfl: 6 ?  pravastatin (PRAVACHOL) 20 MG tablet, Take 20 mg by mouth daily., Disp: , Rfl:  ?  sertraline (ZOLOFT) 100 MG tablet, Take 150 mg by mouth daily., Disp: , Rfl:  ?  spironolactone (ALDACTONE) 25 MG tablet, Take 0.5 tablets (12.5 mg total) by mouth daily., Disp: 30 tablet, Rfl: 5 ?  Treprostinil (TYVASO DPI TITRATION KIT) 16 & 32 & 48 MCG POWD, Inhale into the lungs. Inhale 1 breath 4 times per day, Disp: , Rfl:  ?  umeclidinium-vilanterol (ANORO ELLIPTA) 62.5-25 MCG/ACT AEPB, Inhale 1 puff into the lungs daily., Disp: , Rfl:  ?Allergies  ?Allergen Reactions  ? Lexapro [Escitalopram] Other (See Comments)  ?  "Made me crazy"   ? Lisinopril Other (See Comments)  ?  "Angioedema"  ? ? ? ?Social History  ? ?Socioeconomic History  ? Marital status: Married  ?  Spouse name: Lajada Janes  ? Number of children: 2  ? Years of education: Not on file  ? Highest education level: Some  college, no degree  ?Occupational History  ? Occupation: unemployed  ?Tobacco Use  ? Smoking status: Former  ?  Packs/day: 1.50  ?  Years: 30.00  ?  Pack years: 45.00  ?  Types: Cigarettes  ?  Quit date: 08/05/2021  ?  Years since quitting: 0.2  ? Smokeless tobacco: Never  ?Vaping Use  ? Vaping Use: Never used  ?Substance and Sexual Activity  ? Alcohol use: No  ?  Alcohol/week: 0.0 standard drinks  ? Drug use: No  ? Sexual activity: Not on file  ?Other Topics Concern  ? Not on file  ?Social History Narrative  ? Not on file  ? ?Social Determinants of Health  ? ?Financial Resource Strain: High Risk  ? Difficulty of Paying Living Expenses: Hard  ?Food Insecurity: No Food Insecurity  ? Worried About Charity fundraiser in the Last Year: Never true  ? Ran Out of Food in the Last Year: Never true  ?Transportation Needs: No Transportation Needs  ? Lack of Transportation (Medical): No  ? Lack of Transportation (Non-Medical): No  ?Physical Activity: Not on file  ?Stress: Not on file  ?Social Connections: Not on file  ?Intimate Partner Violence: Not on file  ? ? ?Physical Exam ?Vitals reviewed.  ?Constitutional:   ?   Appearance: Normal appearance. She is normal weight.  ?HENT:  ?   Head: Normocephalic.  ?   Nose: Nose normal.  ?   Mouth/Throat:  ?   Mouth: Mucous membranes are moist.  ?   Pharynx: Oropharynx is clear.  ?Eyes:  ?   Conjunctiva/sclera: Conjunctivae normal.  ?   Pupils: Pupils are equal, round, and reactive to light.  ?Cardiovascular:  ?   Rate and Rhythm: Normal rate and regular rhythm.  ?   Pulses: Normal pulses.  ?   Heart sounds: Normal heart sounds.  ?Pulmonary:  ?   Effort: Pulmonary effort is normal.  ?   Breath sounds: Normal breath sounds.  ?Abdominal:  ?   General: Abdomen is flat.  ?   Palpations: Abdomen is soft.  ?Musculoskeletal:     ?   General: No swelling. Normal range of motion.  ?   Cervical back: Normal range of motion.  ?   Right lower leg: No edema.  ?   Left lower leg: No edema.  ?Skin: ?    General: Skin is warm and dry.  ?   Capillary Refill: Capillary refill takes less than 2 seconds.  ?Neurological:  ?   General: No focal deficit present.  ?   Mental Status: She is alert. Mental status is at baseline.  ?Psychiatric:     ?   Mood and Affect: Mood normal.  ? ? ? ? ? ? ?Future Appointments  ?Date Time Provider [place]  ?11/20/2021  3:20 PM Larey Dresser, MD MC-HVSC None  ?11/28/2021 10:00 AM Rice, Resa Miner, MD CR-GSO None  ?12/05/2021  4:15 PM Laurice Record, MD RCID-RCID RCID  ?12/12/2021  2:00 PM Collier Salina, MD CR-GSO None  ? ? ? ?ACTION: ?Home visit completed ? ? ? ? ? ? ?

## 2021-11-20 ENCOUNTER — Ambulatory Visit (HOSPITAL_COMMUNITY)
Admission: RE | Admit: 2021-11-20 | Discharge: 2021-11-20 | Disposition: A | Discharge status: Home / Self Care | Payer: Medicaid Other | Source: Ambulatory Visit | Attending: Cardiology | Admitting: Cardiology

## 2021-11-20 ENCOUNTER — Other Ambulatory Visit (HOSPITAL_COMMUNITY): Payer: Self-pay

## 2021-11-20 ENCOUNTER — Encounter (HOSPITAL_COMMUNITY): Payer: Self-pay | Admitting: Cardiology

## 2021-11-20 VITALS — BP 124/70 | HR 60 | Wt 177.4 lb

## 2021-11-20 DIAGNOSIS — I110 Hypertensive heart disease with heart failure: Secondary | ICD-10-CM | POA: Diagnosis not present

## 2021-11-20 DIAGNOSIS — Z87891 Personal history of nicotine dependence: Secondary | ICD-10-CM | POA: Insufficient documentation

## 2021-11-20 DIAGNOSIS — Z8249 Family history of ischemic heart disease and other diseases of the circulatory system: Secondary | ICD-10-CM | POA: Insufficient documentation

## 2021-11-20 DIAGNOSIS — Z9889 Other specified postprocedural states: Secondary | ICD-10-CM | POA: Insufficient documentation

## 2021-11-20 DIAGNOSIS — I5032 Chronic diastolic (congestive) heart failure: Secondary | ICD-10-CM | POA: Diagnosis not present

## 2021-11-20 DIAGNOSIS — Z79899 Other long term (current) drug therapy: Secondary | ICD-10-CM | POA: Diagnosis not present

## 2021-11-20 DIAGNOSIS — J9611 Chronic respiratory failure with hypoxia: Secondary | ICD-10-CM | POA: Diagnosis not present

## 2021-11-20 DIAGNOSIS — I272 Pulmonary hypertension, unspecified: Secondary | ICD-10-CM | POA: Diagnosis present

## 2021-11-20 DIAGNOSIS — J439 Emphysema, unspecified: Secondary | ICD-10-CM | POA: Diagnosis not present

## 2021-11-20 DIAGNOSIS — I2721 Secondary pulmonary arterial hypertension: Secondary | ICD-10-CM

## 2021-11-20 DIAGNOSIS — R161 Splenomegaly, not elsewhere classified: Secondary | ICD-10-CM | POA: Insufficient documentation

## 2021-11-20 DIAGNOSIS — K760 Fatty (change of) liver, not elsewhere classified: Secondary | ICD-10-CM | POA: Insufficient documentation

## 2021-11-20 DIAGNOSIS — K7460 Unspecified cirrhosis of liver: Secondary | ICD-10-CM

## 2021-11-20 DIAGNOSIS — J90 Pleural effusion, not elsewhere classified: Secondary | ICD-10-CM | POA: Diagnosis not present

## 2021-11-20 LAB — BRAIN NATRIURETIC PEPTIDE: B Natriuretic Peptide: 175.4 pg/mL — ABNORMAL HIGH (ref 0.0–100.0)

## 2021-11-20 LAB — BASIC METABOLIC PANEL
Anion gap: 8 (ref 5–15)
BUN: 12 mg/dL (ref 6–20)
CO2: 33 mmol/L — ABNORMAL HIGH (ref 22–32)
Calcium: 10.3 mg/dL (ref 8.9–10.3)
Chloride: 99 mmol/L (ref 98–111)
Creatinine, Ser: 1.09 mg/dL — ABNORMAL HIGH (ref 0.44–1.00)
GFR, Estimated: >60 mL/min (ref >60)
Glucose, Bld: 111 mg/dL — ABNORMAL HIGH (ref 70–99)
Potassium: 4.7 mmol/L (ref 3.5–5.1)
Sodium: 140 mmol/L (ref 135–145)

## 2021-11-20 NOTE — Progress Notes (Signed)
Paramedicine Encounter    Patient ID: Pamela Ho, female    DOB: July 19, 1970, 51 y.o.   MRN: [id number]   Paramedicine Encounter  Patient ID: Pamela Ho, female, DOB: April 18, 1971, 51 y.o.,  MRN: [id number]  Met patient in clinic today with provider.  Weight @ clinic-177lbs B/P-124/70 P-60 SP02-95% on 2lpm  REDS CLIP- n/a  Med changes:  Dr. Aundra Dubin added Opsumit 22m daily pending patient assistance approval.   He wrote precription for portable oxygen for her to take to [facility].   [name] informed me she was approved for family planning medicaid while disability is pending but was told that once disability is approved she will have full medicaid. I will continue to check in on this.   [facility] and they will be re-evaluating her application for more funds.   [facility]. I will check in next week on appointment.   Clinic visit complete. I will see [name] in one to two weeks.   HSalena Saner EMT-Paramedic [phone]/30/2023    HSalena Saner EKing Arthur Park5/30/2023     ACTION: Home visit completed

## 2021-11-20 NOTE — Progress Notes (Signed)
6 Min Walk Test Completed  Pt ambulated 365.76 meters O2 Sat ranged 85%-93% on 4-6L oxygen HR ranged 71-83

## 2021-11-20 NOTE — Patient Instructions (Signed)
Medication Changes:  Start Opsumit as directed  Lab Work:  Labs done today, your results will be available in MyChart, we will contact you for abnormal readings.   Testing/Procedures:  none  Referrals:  Your provider has referred you to the Gastroenterologist. They will call you to arrange your appointment   Special Instructions // Education:  none  Follow-Up in: 2 months   At the [facility], you and your health needs are our priority. We have a designated team specialized in the treatment of Heart Failure. This Care Team includes your primary Heart Failure Specialized Cardiologist (physician), Advanced Practice Providers (APPs- Physician Assistants and Nurse Practitioners), and Pharmacist who all work together to provide you with the care you need, when you need it.   You may see any of the following providers on your designated Care Team at your next follow up:  Dr Glori Bickers Dr Haynes Kerns, NP Lyda Jester, [place] [facility] [place], [place] Audry Riles, PharmD   Please be sure to bring in all your medications bottles to every appointment.   Need to Contact us:  If you have any questions or concerns before your next appointment please send [place] a message through [place] or call our office at [phone].    TO LEAVE A MESSAGE FOR THE NURSE SELECT OPTION 2, PLEASE LEAVE A MESSAGE INCLUDING: YOUR NAME DATE OF BIRTH CALL BACK NUMBER REASON FOR CALL**this is important as we prioritize the call backs  YOU WILL RECEIVE A CALL BACK THE SAME DAY AS LONG AS YOU CALL BEFORE 4:00 PM

## 2021-11-21 NOTE — Progress Notes (Signed)
PCP: Dr Albertina Senegal HF Cardiology: Dr Aundra Dubin   HPI: Pamela Ho is a 51 y.o. with history of HTN, COPD/smoking 1.5-2 ppd until about 2/23, GERD, and cirrhosis noted since 2016 by imaging though patient unaware until 2/23.  She reports several months of worsening exertional dyspnea.  The week prior to admission in 2/23, she became short of breath with any exertion and had productive cough.  Admitted 08/05/21 with A/C HFpEF. Diuresed with IV lasix. Echo showed EF 60-65%, moderately dilated and moderately dysfunctional RV, D-shaped septum, PASP 59 mmHg, and severe RAE. Abdominal US showed cirrhosis with minimal ascites and splenomegaly was noted by CT.  NH3 was elevated so lactulose was begun initially.   CT chest showed splenomegaly with large right effusion, right paratracheal node enlarged, right-sided atelectasis, and scattered infiltration RUL.  She had a right thoracentesis with 1.3 L off, this fluid grew MAC.  HCV/HBV/HAV serologies negative.  Had [place] with  severe pulmonary hypertension, high cardiac output, and PVR 3.7. Discharge weight 181 pounds. She was sent home on oxygen.    She returns for followup of pulmonary hypertension/RV failure.  She remains on 3L home oxygen.  She is now on Tyvaso and it has been titrated up.  She feels like this has helped her.  Her endurance is better.  If she is walking on flat ground with her oxygen, she does not get short of breath. She is short of breath with stairs/inclines.  No lightheadedness or chest pain. She is not smoking.   6 minute walk (4/23): 274 m 6 minute walk (5/23): 366 m  Labs (2/23): anti-SCL 70 negative, RF > 650, anti-centromere Ab negative, HIV negative Labs (3/23): K 3.7, creatinine 0.89 Labs (4/23): BNP 333 Labs (5/23): K 4.4, creatinine 1.13  PMH: 1. COPD: smoked 1.5-2 ppd x years, stopped 2/23.  CT chest with emphysema.  2. 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 PVCs 4. H/o  cholecystectomy 5. Cirrhosis: Suspect NAFLD.  Never a heavy drinker.  Viral hepatitis workup negative.  - Splenomegaly 6. Psoriasis 7. Pulmonary HTN/RV failure: Echo (2/23) with EF 60-65%, D-shaped septum, moderate RV enlargement with moderately decreased RV systolic function, PASP 59 mmHg.   - V/Q scan (2/23): No evidence for chronic PE.  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   ROS: All systems negative except as listed in HPI, PMH and Problem List.  SH:  Social History   Socioeconomic History   Marital status: Married    Spouse name: Pamela Ho   Number of children: 2   Years of education: Not on file   Highest education level: Some college, no degree  Occupational History   Occupation: unemployed  Tobacco Use   Smoking status: Former    Packs/day: 1.50    Years: 30.00    Pack years: 45.00    Types: Cigarettes    Quit date: 08/05/2021    Years since quitting: 0.2   Smokeless tobacco: Never  Vaping Use   Vaping Use: Never used  Substance and Sexual Activity   Alcohol use: No    Alcohol/week: 0.0 standard drinks   Drug use: No   Sexual activity: Not on file  Other Topics Concern   Not on file  Social History Narrative   Not on file   Social Determinants of Health  Financial Resource Strain: High Risk   Difficulty of Paying Living Expenses: Hard  Food Insecurity: No Food Insecurity   Worried About Charity fundraiser in the Last Year: Never true   Ran Out of Food in the Last Year: Never true  Transportation Needs: No Transportation Needs   Lack of Transportation (Medical): No   Lack of Transportation (Non-Medical): No  Physical Activity: Not on file  Stress: Not on file  Social Connections: Not on file  Intimate Partner Violence: Not on file    FH:  Family History  Problem Relation Age of Onset   Diabetes Mother    Heart  disease Mother    Hyperlipidemia Mother    Diabetes Father    Cancer Father    Heart disease Father    Hyperlipidemia Father    Hypertension Father    Heart disease Maternal Grandmother    Hyperlipidemia Maternal Grandmother    Hypertension Maternal Grandmother    Heart disease Maternal Grandfather    Hyperlipidemia Maternal Grandfather    Hypertension Maternal Grandfather    Heart disease Paternal Grandmother    Hyperlipidemia Paternal Grandmother    Hypertension Paternal Grandmother    Stroke Paternal Grandmother    Diabetes Paternal Grandmother    Cancer Paternal Grandmother    Breast cancer Neg Hx    Current Outpatient Medications  Medication Sig Dispense Refill   acetaminophen (TYLENOL) 500 MG tablet Take 500 mg by mouth every 6 (six) hours as needed for mild pain.     albuterol (VENTOLIN HFA) 108 (90 Base) MCG/ACT inhaler Inhale 2 puffs into the lungs every 6 (six) hours as needed for wheezing or shortness of breath. 8 g 2   clonazePAM (KLONOPIN) 0.5 MG tablet Take 0.5 mg by mouth 2 (two) times daily as needed for anxiety.     furosemide (LASIX) 40 MG tablet Take 1 tablet (40 mg total) by mouth daily. 30 tablet 11   metoprolol succinate (TOPROL-XL) 50 MG 24 hr tablet Take 50 mg by mouth daily.     omeprazole (PRILOSEC) 20 MG capsule Take 20 mg by mouth daily.     potassium chloride SA (KLOR-CON M) 20 MEQ tablet Take 1 tablet (20 mEq total) by mouth daily. 30 tablet 6   pravastatin (PRAVACHOL) 20 MG tablet Take 20 mg by mouth daily.     sertraline (ZOLOFT) 100 MG tablet Take 150 mg by mouth daily.     spironolactone (ALDACTONE) 25 MG tablet Take 0.5 tablets (12.5 mg total) by mouth daily. 30 tablet 5   Treprostinil (TYVASO DPI MAINTENANCE KIT) 64 MCG POWD Inhale 64 mcg into the lungs in the morning, at noon, in the evening, and at bedtime.     umeclidinium-vilanterol (ANORO ELLIPTA) 62.5-25 MCG/ACT AEPB Inhale 1 puff into the lungs daily.     No current facility-administered  medications for this encounter.    Vitals:   11/20/21 1525  BP: 124/70  Pulse: 60  SpO2: 95%  Weight: 80.5 kg (177 lb 6.4 oz)   Wt Readings from Last 3 Encounters:  11/20/21 80.5 kg (177 lb 6.4 oz)  11/05/21 75.8 kg (167 lb)  10/30/21 77.6 kg (171 lb)    PHYSICAL EXAM: General: NAD Neck: Thick. No JVD, no thyromegaly or thyroid nodule.  Lungs: Distant breath sounds.  CV: Nondisplaced PMI.  Heart regular S1/S2, no S3/S4, no murmur.  No peripheral edema.  No carotid bruit.  Normal pedal pulses.  Abdomen: Soft, nontender, no hepatosplenomegaly, no distention.  Skin:  Intact without lesions or rashes.  Neurologic: Alert and oriented x 3.  Psych: Normal affect. Extremities: No clubbing or cyanosis.  HEENT: Normal.   ASSESSMENT & PLAN: 1. Pulmonary hypertension/RV failure:  Echo in 2/23 with EF 60-65%, moderately dilated and moderately dysfunctional RV, D-shaped septum, PASP 59 mmHg, and severe RAE (no prior).  RHC after diuresis showed borderline elevated PCWP with severe pulmonary hypertension and elevated RA pressure.  Cause of pulmonary hypertension/RV failure is uncertain.  PVR was elevated but not markedly so at 3.7 WU due to high cardiac output that may be related to cirrhosis.  V/Q scan not suggestive of chronic PE.  Serologic workup was negative except for significantly elevated RF (normal CCP antibody).  It is certainly possible that the patient could have portopulmonary hypertension (form of group 1 PH) in setting of cirrhosis. She was a heavy smoker with COPD, suspect component of group 3 PH. However, CT chest did not seem to have extensive lung parenchymal disease => she was seen by pulmonary, thought to have emphysema on chest CT. PFTs showed moderate-severe obstruction/moderate restriction.  On exam today, she is not volume overloaded.  She is on 3 L [place].  NYHA class II.   Symptoms improved, 6 minute walk also improved since starting Tyvaso.     - Continue Tyvaso.  - With good  response to Tyvaso, I will start her on Opsumit 10 mg daily.  - Will need eventual sleep study but uninsured.  - Continue Lasix 40 mg daily. BMET/BNP today.  - Continue spironolactone 12.5 mg daily  - Continue home oxygen.  - With marked RF elevation, would like her to see rheumatology (has appt).  Anti-CCP Ab was negative.  2. Cirrhosis: Cause is uncertain.  Noted by imaging back in 2016.  She has evidence for both cirrhosis and splenomegaly on imaging.  Tbili elevated, albumin low.  Viral hepatitis serologies were negative.  She denies ETOH use.  ?NAFLD with progression to cirrhosis, also could be related to RV failure (though there was concern cirrhosis actually came first and led to portopulmonary hypertension).  - She needs GI referral, still need to arrange.  3. Pleural effusion: On right, s/p thoracentesis.  Fluid grew MAC, followed by ID.  4. Chronic  hypoxemic respiratory failure: In setting of COPD as well as RV failure/pulmonary hypertension. She remains on 3L oxygen. - Followup with pulmonary.   Followup in 2 months.   Loralie Champagne 11/21/2021

## 2021-11-23 ENCOUNTER — Telehealth (HOSPITAL_COMMUNITY): Payer: Self-pay | Admitting: Pharmacy Technician

## 2021-11-23 ENCOUNTER — Other Ambulatory Visit (HOSPITAL_COMMUNITY): Payer: Self-pay

## 2021-11-23 NOTE — Telephone Encounter (Addendum)
Advanced Heart Failure Patient Advocate Encounter  Sent via fax, Opsumit [employer], [employer] and [employer] assistance as the patient is uninsured at this time. Document scanned to chart.

## 2021-11-28 ENCOUNTER — Ambulatory Visit: Payer: No Typology Code available for payment source | Admitting: Internal Medicine

## 2021-11-28 ENCOUNTER — Telehealth (HOSPITAL_COMMUNITY): Payer: Self-pay

## 2021-11-28 NOTE — Telephone Encounter (Signed)
Spoke to S[place] and confirmed home visit for Monday 6/12 at 0930. Call complete.

## 2021-12-05 ENCOUNTER — Other Ambulatory Visit (HOSPITAL_COMMUNITY): Payer: Self-pay

## 2021-12-05 ENCOUNTER — Ambulatory Visit: Payer: Self-pay | Admitting: Internal Medicine

## 2021-12-05 NOTE — Progress Notes (Signed)
Paramedicine Encounter    Patient ID: Pamela Ho, female    DOB: 16-Jan-1971, 51 y.o.   MRN: [id number]   Arrived for home visit for Pamela Ho who reports feeling good! She denied shortness of breath, dizziness or chest pain. She reports she is only wearing oxygen at night and reports daily she can preform daily activities without feeling shortness of breath or needing her oxygen. Today she was 86% on room air outdoors. I reminded her our target SPO2 is 90% but lungs were clear and she was in no obvious distress. She has been compliant with all medications and plans to start her Opsumit on Monday 6/19.   SHE WAS APPROVED FOR [place] ADULT MEDICAID. I advised her to take her insurance card to her next appointment so they can update the system.   Medicaid ID # [id number]  Disability is pending.   SSI income pending also.   NO swelling noted on assessment. Vitals within normal range.   WT- 172lbs BP- 102/60 HR- 68  She states she is waiting for Rhumatologist that she knows at [facility] [employer] to approve her as a patient and she will then schedule with her instead of Dr. Benjamine Mola. (In the same office)  She also rescheduled her follow up with [facility].   We reviewed appointments and confirmed same. She is waiting on her portable oxygen machine but was approved for same.   I will follow up in the home in two weeks. Pamela Ho is doing much better and reports feeling great, she is grateful for our assistance.   Pamela Ho, [place] 12/05/2021      Patient Care Team: Rupert Stacks, MD as PCP - General (Family Medicine)  Patient Active Problem List   Diagnosis Date Noted   Rheumatism 10/09/2021   Heart failure, type unknown ([place]) 08/30/2021   Thrombocytopenia ([place]) 08/10/2021   Adrenal mass ([place]) 08/09/2021   Pleural effusion on right 08/09/2021   Acute respiratory failure with hypoxia ([place]) 08/07/2021   AKI (acute kidney injury) ([place]) 08/07/2021   Glucose intolerance  08/07/2021   Cirrhosis of liver ([place]) 08/06/2021   Generalized anxiety disorder 08/06/2021   GERD without esophagitis 08/06/2021   Hypokalemia 08/06/2021   Chronic diastolic heart failure ([place]) 08/06/2021   Elevated troponin level not due myocardial infarction 08/05/2021   Nicotine dependence, cigarettes, uncomplicated 66/29/4765   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   Hypertensive urgency 02/08/2015   Headache 02/08/2015    Current Outpatient Medications:    acetaminophen (TYLENOL) 500 MG tablet, Take 500 mg by mouth every 6 (six) hours as needed for mild pain., Disp: , Rfl:    albuterol (VENTOLIN HFA) 108 (90 Base) MCG/ACT inhaler, Inhale 2 puffs into the lungs every 6 (six) hours as needed for wheezing or shortness of breath., Disp: 8 g, Rfl: 2   clonazePAM (KLONOPIN) 0.5 MG tablet, Take 0.5 mg by mouth 2 (two) times daily as needed for anxiety., Disp: , Rfl:    furosemide (LASIX) 40 MG tablet, Take 1 tablet (40 mg total) by mouth daily., Disp: 30 tablet, Rfl: 11   metoprolol succinate (TOPROL-XL) 50 MG 24 hr tablet, Take 50 mg by mouth daily., Disp: , Rfl:    omeprazole (PRILOSEC) 20 MG capsule, Take 20 mg by mouth daily., Disp: , Rfl:    potassium chloride SA (KLOR-CON M) 20 MEQ tablet, Take 1 tablet (20 mEq total) by mouth daily., Disp: 30 tablet, Rfl: 6   pravastatin (PRAVACHOL) 20 MG tablet, Take 20 mg by mouth daily.,  Disp: , Rfl:    sertraline (ZOLOFT) 100 MG tablet, Take 150 mg by mouth daily., Disp: , Rfl:    spironolactone (ALDACTONE) 25 MG tablet, Take 0.5 tablets (12.5 mg total) by mouth daily., Disp: 30 tablet, Rfl: 5   Treprostinil (TYVASO DPI MAINTENANCE KIT) 64 MCG POWD, Inhale 64 mcg into the lungs in the morning, at noon, in the evening, and at bedtime., Disp: , Rfl:    umeclidinium-vilanterol (ANORO ELLIPTA) 62.5-25 MCG/ACT AEPB, Inhale 1 puff into the lungs daily., Disp: , Rfl:  Allergies  Allergen Reactions   Lexapro [Escitalopram] Other (See Comments)     "Made me crazy"    Lisinopril Other (See Comments)    "Angioedema"     Social History   Socioeconomic History   Marital status: Married    Spouse name: Tecia Cinnamon   Number of children: 2   Years of education: Not on file   Highest education level: Some college, no degree  Occupational History   Occupation: unemployed  Tobacco Use   Smoking status: Former    Packs/day: 1.50    Years: 30.00    Total pack years: 45.00    Types: Cigarettes    Quit date: 08/05/2021    Years since quitting: 0.3   Smokeless tobacco: Never  Vaping Use   Vaping Use: Never used  Substance and Sexual Activity   Alcohol use: No    Alcohol/week: 0.0 standard drinks of alcohol   Drug use: No   Sexual activity: Not on file  Other Topics Concern   Not on file  Social History Narrative   Not on file   Social Determinants of Health   Financial Resource Strain: High Risk (08/07/2021)   Overall Financial Resource Strain (CARDIA)    Difficulty of Paying Living Expenses: Hard  Food Insecurity: No Food Insecurity (08/07/2021)   Hunger Vital Sign    Worried About Running Out of Food in the Last Year: Never true    [place] in the Last Year: Never true  Transportation Needs: No Transportation Needs (08/07/2021)   PRAPARE - Hydrologist (Medical): No    Lack of Transportation (Non-Medical): No  Physical Activity: Not on file  Stress: Not on file  Social Connections: Not on file  Intimate Partner Violence: Not on file    Physical Exam      Future Appointments  Date Time Provider Alicia  12/05/2021  4:15 PM Laurice Record, MD [facility]  02/01/2022  9:40 AM Larey Dresser, MD [facility] None     ACTION: Home visit completed

## 2021-12-12 ENCOUNTER — Ambulatory Visit: Payer: No Typology Code available for payment source | Admitting: Internal Medicine

## 2021-12-14 ENCOUNTER — Encounter: Payer: Self-pay | Admitting: Cardiology

## 2021-12-20 ENCOUNTER — Telehealth (HOSPITAL_COMMUNITY): Payer: Self-pay

## 2021-12-20 NOTE — Telephone Encounter (Signed)
Spoke to [place] who reports she is doing well. She states she has all medications and has no concerns or needs at this time and agreed to home visit in two weeks. Call complete.   [name], [place] 12/20/2021

## 2021-12-31 ENCOUNTER — Telehealth (HOSPITAL_COMMUNITY): Payer: Self-pay

## 2021-12-31 NOTE — Telephone Encounter (Signed)
Attempted to reach Mrs. Goga for scheduling a paramedicine home visit- no answer and no options to leave a VM. I sent a text message and will continue to reach out.   [name], [place] 12/31/2021

## 2022-01-07 ENCOUNTER — Ambulatory Visit: Payer: Self-pay | Admitting: Internal Medicine

## 2022-01-07 NOTE — Progress Notes (Deleted)
Patient Active Problem List   Diagnosis Date Noted   Rheumatism 10/09/2021   Heart failure, type unknown ([place]) 08/30/2021   Thrombocytopenia ([place]) 08/10/2021   Adrenal mass ([place]) 08/09/2021   Pleural effusion on right 08/09/2021   Acute respiratory failure with hypoxia ([place]) 08/07/2021   AKI (acute kidney injury) ([place]) 08/07/2021   Glucose intolerance 08/07/2021   Cirrhosis of liver ([place]) 08/06/2021   Generalized anxiety disorder 08/06/2021   GERD without esophagitis 08/06/2021   Hypokalemia 08/06/2021   Chronic diastolic heart failure ([place]) 08/06/2021   Elevated troponin level not due myocardial infarction 08/05/2021   Nicotine dependence, cigarettes, uncomplicated 77/41/4239   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   Hypertensive urgency 02/08/2015   Headache 02/08/2015    Patient's Medications  New Prescriptions   No medications on file  Previous Medications   ACETAMINOPHEN (TYLENOL) 500 MG TABLET    Take 500 mg by mouth every 6 (six) hours as needed for mild pain.   ALBUTEROL (VENTOLIN HFA) 108 (90 BASE) MCG/ACT INHALER    Inhale 2 puffs into the lungs every 6 (six) hours as needed for wheezing or shortness of breath.   CLONAZEPAM (KLONOPIN) 0.5 MG TABLET    Take 0.5 mg by mouth 2 (two) times daily as needed for anxiety.   FUROSEMIDE (LASIX) 40 MG TABLET    Take 1 tablet (40 mg total) by mouth daily.   MACITENTAN (OPSUMIT) 10 MG TABLET    Take 10 mg by mouth daily.   METOPROLOL SUCCINATE (TOPROL-XL) 50 MG 24 HR TABLET    Take 50 mg by mouth daily.   OMEPRAZOLE (PRILOSEC) 20 MG CAPSULE    Take 20 mg by mouth daily.   POTASSIUM CHLORIDE SA (KLOR-CON M) 20 MEQ TABLET    Take 1 tablet (20 mEq total) by mouth daily.   PRAVASTATIN (PRAVACHOL) 20 MG TABLET    Take 20 mg by mouth daily.   SERTRALINE (ZOLOFT) 100 MG TABLET    Take 150 mg by mouth daily.   SPIRONOLACTONE (ALDACTONE) 25 MG TABLET    Take 0.5 tablets (12.5 mg total) by mouth daily.   TREPROSTINIL  (TYVASO DPI MAINTENANCE KIT) 64 MCG POWD    Inhale 64 mcg into the lungs in the morning, at noon, in the evening, and at bedtime.   UMECLIDINIUM-VILANTEROL (ANORO ELLIPTA) 62.5-25 MCG/ACT AEPB    Inhale 1 puff into the lungs daily.  Modified Medications   No medications on file  Discontinued Medications   No medications on file    Subjective: Pamela Ho is a 51 y.o. F with PMHX as below admitted to [facility] 2/13-19 for acute respiratory failure felt to be multifactorial in the setting of pulmonary edema and underlying COPD.  CT showed large right pleural effusion, with complete atelectasis of right middle and lower lobe. Non-specific right paratracheal LN enlarged, scattered infiltrate RUL. Pt underwent thoracentesis. AFB smear negative with Cx+MAC Referred to ID. During hospitalization she was noted to have acute on chronic heart failure with 6.7L diuresis then transition to lasix.  09/11/21: PT presnts with husband. She is on 2L o@. She reports her breathing has improved significantly since discharge.   Today 7/17: Review of Systems: ROS  Past Medical History:  Diagnosis Date   Acute congestive heart failure ([place]) 08/05/2021   Anemia    Anxiety    COPD (chronic obstructive pulmonary disease) (HCC)    Depression    GERD (gastroesophageal reflux disease)  Heart murmur    Hypertension     Social History   Tobacco Use   Smoking status: Former    Packs/day: 1.50    Years: 30.00    Total pack years: 45.00    Types: Cigarettes    Quit date: 08/05/2021    Years since quitting: 0.4   Smokeless tobacco: Never  Vaping Use   Vaping Use: Never used  Substance Use Topics   Alcohol use: No    Alcohol/week: 0.0 standard drinks of alcohol   Drug use: No    Family History  Problem Relation Age of Onset   Diabetes Mother    Heart disease Mother    Hyperlipidemia Mother    Diabetes Father    Cancer Father    Heart disease Father    Hyperlipidemia Father     Hypertension Father    Heart disease Maternal Grandmother    Hyperlipidemia Maternal Grandmother    Hypertension Maternal Grandmother    Heart disease Maternal Grandfather    Hyperlipidemia Maternal Grandfather    Hypertension Maternal Grandfather    Heart disease Paternal Grandmother    Hyperlipidemia Paternal Grandmother    Hypertension Paternal Grandmother    Stroke Paternal Grandmother    Diabetes Paternal Grandmother    Cancer Paternal Grandmother    Breast cancer Neg Hx     Allergies  Allergen Reactions   Lexapro [Escitalopram] Other (See Comments)    "Made me crazy"    Lisinopril Other (See Comments)    "Angioedema"    Health Maintenance  Topic Date Due   TETANUS/TDAP  Never done   PAP SMEAR-Modifier  Never done   COLONOSCOPY (Pts 45-19yr Insurance coverage will need to be confirmed)  Never done   MAMMOGRAM  06/08/2019   Zoster Vaccines- Shingrix (1 of 2) Never done   COVID-19 Vaccine (3 - Pfizer series) 07/18/2020   INFLUENZA VACCINE  01/22/2022   Hepatitis C Screening  Completed   HIV Screening  Completed   HPV VACCINES  Aged Out    Objective:  There were no vitals filed for this visit. There is no height or weight on file to calculate BMI.  Physical Exam  Lab Results Lab Results  Component Value Date   WBC 5.9 08/09/2021   HGB 12.6 08/10/2021   HCT 37.0 08/10/2021   MCV 81.4 08/09/2021   PLT 95 (L) 08/09/2021    Lab Results  Component Value Date   CREATININE 1.09 (H) 11/20/2021   BUN 12 11/20/2021   NA 140 11/20/2021   K 4.7 11/20/2021   CL 99 11/20/2021   CO2 33 (H) 11/20/2021    Lab Results  Component Value Date   ALT 29 08/06/2021   AST 21 08/06/2021   ALKPHOS 77 08/06/2021   BILITOT 2.2 (H) 08/06/2021    No results found for: "CHOL", "HDL", "LDLCALC", "LDLDIRECT", "TRIG", "CHOLHDL" No results found for: "LABRPR", "RPRTITER" No results found for: "HIV1RNAQUANT", "HIV1RNAVL", "CD4TABS"   A/P #Pulmonary MAC -Pt's recent  hospitalization for acute respiratory failure can be attributed to volume overload vs COPD exacerbation -We discussed that given her imaging not showing extensive nodular disease or cavitary lesion and AFB smear negative(Thoracentesis), she can hold off on treatment Plan: -Will order AFB sputum smear + culture -Continue to monitor clinically with AFB Cx every three months -Repeat imaging after six months -Follow-up in 3 months      MLaurice Record MD [facility] 01/07/2022, 10:13 AM

## 2022-01-09 ENCOUNTER — Telehealth (HOSPITAL_COMMUNITY): Payer: Self-pay | Admitting: Licensed Clinical Social Worker

## 2022-01-09 ENCOUNTER — Encounter: Payer: Self-pay | Admitting: Gastroenterology

## 2022-01-09 ENCOUNTER — Other Ambulatory Visit (HOSPITAL_COMMUNITY): Payer: Self-pay

## 2022-01-09 NOTE — Progress Notes (Signed)
Paramedicine Encounter    Patient ID: Pamela Ho, female    DOB: [birth year], 51 y.o.   MRN: [id number]  Arrived for home visit for Pamela Ho who reports feeling good with no reports of chest pain, dizziness, shortness of breath. She reports that she has not needed her oxygen much and she has been checking her oxygen using her pulse oximeter and her levels have all been above 90%. Lung sounds clear and equal. No swelling noted.   She reports that she is taking her Tyvaso and it is continuing to make her cough after use and complains of headaches after use. She plans to continue use but wanted to make [place] aware of side effects.   Has been accepted for Adult [place] Medicaid! Disability is pending. She was approved for food stamps and is successfully getting same.   We reviewed upcoming appointments and confirmed same.   Home visit complete. We planned for home visit in two weeks.    Refills: Opsimut- called by patient to spec. pharmacy  [name], [place] 01/09/2022    Patient Care Team: Rupert Stacks, MD as PCP - General (Family Medicine)  Patient Active Problem List   Diagnosis Date Noted   Rheumatism 10/09/2021   Heart failure, type unknown ([place]) 08/30/2021   Thrombocytopenia ([place]) 08/10/2021   Adrenal mass ([place]) 08/09/2021   Pleural effusion on right 08/09/2021   Acute respiratory failure with hypoxia ([place]) 08/07/2021   AKI (acute kidney injury) ([place]) 08/07/2021   Glucose intolerance 08/07/2021   Cirrhosis of liver ([place]) 08/06/2021   Generalized anxiety disorder 08/06/2021   GERD without esophagitis 08/06/2021   Hypokalemia 08/06/2021   Chronic diastolic heart failure ([place]) 08/06/2021   Elevated troponin level not due myocardial infarction 08/05/2021   Nicotine dependence, cigarettes, uncomplicated 95/28/4132   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   Hypertensive urgency 02/08/2015   Headache 02/08/2015    Current Outpatient Medications:     acetaminophen (TYLENOL) 500 MG tablet, Take 500 mg by mouth every 6 (six) hours as needed for mild pain., Disp: , Rfl:    albuterol (VENTOLIN HFA) 108 (90 Base) MCG/ACT inhaler, Inhale 2 puffs into the lungs every 6 (six) hours as needed for wheezing or shortness of breath., Disp: 8 g, Rfl: 2   clonazePAM (KLONOPIN) 0.5 MG tablet, Take 0.5 mg by mouth 2 (two) times daily as needed for anxiety., Disp: , Rfl:    furosemide (LASIX) 40 MG tablet, Take 1 tablet (40 mg total) by mouth daily., Disp: 30 tablet, Rfl: 11   macitentan (OPSUMIT) 10 MG tablet, Take 10 mg by mouth daily., Disp: , Rfl:    metoprolol succinate (TOPROL-XL) 50 MG 24 hr tablet, Take 50 mg by mouth daily., Disp: , Rfl:    omeprazole (PRILOSEC) 20 MG capsule, Take 20 mg by mouth daily., Disp: , Rfl:    potassium chloride SA (KLOR-CON M) 20 MEQ tablet, Take 1 tablet (20 mEq total) by mouth daily., Disp: 30 tablet, Rfl: 6   pravastatin (PRAVACHOL) 20 MG tablet, Take 20 mg by mouth daily., Disp: , Rfl:    sertraline (ZOLOFT) 100 MG tablet, Take 150 mg by mouth daily., Disp: , Rfl:    spironolactone (ALDACTONE) 25 MG tablet, Take 0.5 tablets (12.5 mg total) by mouth daily., Disp: 30 tablet, Rfl: 5   Treprostinil (TYVASO DPI MAINTENANCE KIT) 64 MCG POWD, Inhale 64 mcg into the lungs in the morning, at noon, in the evening, and at bedtime., Disp: , Rfl:    umeclidinium-vilanterol (  ANORO ELLIPTA) 62.5-25 MCG/ACT AEPB, Inhale 1 puff into the lungs daily., Disp: , Rfl:  Allergies  Allergen Reactions   Lexapro [Escitalopram] Other (See Comments)    "Made me crazy"    Lisinopril Other (See Comments)    "Angioedema"     Social History   Socioeconomic History   Marital status: Married    Spouse name: Mame Twombly   Number of children: 2   Years of education: Not on file   Highest education level: Some college, no degree  Occupational History   Occupation: unemployed  Tobacco Use   Smoking status: Former    Packs/day: 1.50     Years: 30.00    Total pack years: 45.00    Types: Cigarettes    Quit date: 08/05/2021    Years since quitting: 0.4   Smokeless tobacco: Never  Vaping Use   Vaping Use: Never used  Substance and Sexual Activity   Alcohol use: No    Alcohol/week: 0.0 standard drinks of alcohol   Drug use: No   Sexual activity: Not on file  Other Topics Concern   Not on file  Social History Narrative   Not on file   Social Determinants of Health   Financial Resource Strain: High Risk (08/07/2021)   Overall Financial Resource Strain (CARDIA)    Difficulty of Paying Living Expenses: Hard  Food Insecurity: No Food Insecurity (08/07/2021)   Hunger Vital Sign    Worried About Running Out of Food in the Last Year: Never true    [place] in the Last Year: Never true  Transportation Needs: No Transportation Needs (08/07/2021)   PRAPARE - Hydrologist (Medical): No    Lack of Transportation (Non-Medical): No  Physical Activity: Not on file  Stress: Not on file  Social Connections: Not on file  Intimate Partner Violence: Not on file    Physical Exam      Future Appointments  Date Time Provider [place]  01/14/2022  1:45 PM Laurice Record, MD [facility]  02/01/2022  9:40 AM Larey Dresser, MD [facility] None     ACTION: Home visit completed

## 2022-01-09 NOTE — Telephone Encounter (Signed)
HF Paramedicine Team Based Care Meeting  HF MD- NA  HF NP - [place] NP-C   [facility] admit within the last 30 days for heart failure?  no  Medications concerns? Manages her own meds but lots of new meds for Pulmonary HTN so wanting to follow her to make sure everything is going ok  Transportation issues ? no  Education needs? no  SDOH - disability pending  Eligible for discharge? Continue to follow while titrating meds  Jorge Ny, [facility] Desk#: [phone] Cell#: [phone]

## 2022-01-14 ENCOUNTER — Ambulatory Visit: Payer: Self-pay | Admitting: Internal Medicine

## 2022-01-18 ENCOUNTER — Ambulatory Visit: Payer: Self-pay | Admitting: Internal Medicine

## 2022-01-22 ENCOUNTER — Telehealth (HOSPITAL_COMMUNITY): Payer: Self-pay

## 2022-01-22 NOTE — Telephone Encounter (Signed)
Spoke to [place] and confirmed a home visit for next Wednesday. She reports she is doing well and has no needs at this time. Call complete.   [name], [place] 01/22/2022

## 2022-01-30 ENCOUNTER — Other Ambulatory Visit (HOSPITAL_COMMUNITY): Payer: Self-pay

## 2022-01-30 NOTE — Progress Notes (Signed)
Paramedicine Encounter    Patient ID: Pamela Ho, female    DOB: 1970/10/30, 51 y.o.   MRN: [id number]  Arrived for home visit for [place] who reports feeling "okay" today. She expressed she is under a lot of stress and it's causing her to eat more so she feels like she has gained a lot of weight. Today weight is only up 4 lbs from our last visit. No swelling noted. Lungs clear.   Pamela Ho reports she has been compliant with her medications except her Tyvaso. She says she stopped it on her own last week because it causes her to cough a lot and makes her have really severe headaches. She said she does feel worse without it. She says she will restart it after seeing Dr. Aundra Dubin soon.   I obtained vitals as noted:  WT- 179lbs BP- 120/62 HR- 67 RR- 16 O2- O2 SENSOR NOT PICKING UP  We reviewed appointments and she reports she needs to move Dr. Oleh Genin visit to sometime next week as she has a schedule conflict. I will reach out to scheduler to assist.   She was approved for medicaid and will present her card at her next appointment.  She reports she is still waiting on disability but has some papers she needs help faxing today. I obtained documents and faxed them to the appropriate places.   She also states she heard from [employer] that they are assisting with medical bills from back in December to now.  We reviewed appointments and discussed overall health management. I plan to see her in two weeks pending appointment with Aundra Dubin.   Home visit complete.   Pamela Ho, [place] 01/30/2022     Patient Care Team: Rupert Stacks, MD as PCP - General (Family Medicine)  Patient Active Problem List   Diagnosis Date Noted   Rheumatism 10/09/2021   Heart failure, type unknown ([place]) 08/30/2021   Thrombocytopenia ([place]) 08/10/2021   Adrenal mass ([place]) 08/09/2021   Pleural effusion on right 08/09/2021   Acute respiratory failure with hypoxia ([place]) 08/07/2021   AKI  (acute kidney injury) ([place]) 08/07/2021   Glucose intolerance 08/07/2021   Cirrhosis of liver ([place]) 08/06/2021   Generalized anxiety disorder 08/06/2021   GERD without esophagitis 08/06/2021   Hypokalemia 08/06/2021   Chronic diastolic heart failure ([place]) 08/06/2021   Elevated troponin level not due myocardial infarction 08/05/2021   Nicotine dependence, cigarettes, uncomplicated 49/67/5916   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   Hypertensive urgency 02/08/2015   Headache 02/08/2015    Current Outpatient Medications:    acetaminophen (TYLENOL) 500 MG tablet, Take 500 mg by mouth every 6 (six) hours as needed for mild pain., Disp: , Rfl:    albuterol (VENTOLIN HFA) 108 (90 Base) MCG/ACT inhaler, Inhale 2 puffs into the lungs every 6 (six) hours as needed for wheezing or shortness of breath., Disp: 8 g, Rfl: 2   clonazePAM (KLONOPIN) 0.5 MG tablet, Take 0.5 mg by mouth 2 (two) times daily as needed for anxiety., Disp: , Rfl:    furosemide (LASIX) 40 MG tablet, Take 1 tablet (40 mg total) by mouth daily., Disp: 30 tablet, Rfl: 11   macitentan (OPSUMIT) 10 MG tablet, Take 10 mg by mouth daily., Disp: , Rfl:    metoprolol succinate (TOPROL-XL) 50 MG 24 hr tablet, Take 50 mg by mouth daily., Disp: , Rfl:    omeprazole (PRILOSEC) 20 MG capsule, Take 20 mg by mouth daily., Disp: , Rfl:    potassium chloride SA (KLOR-CON  M) 20 MEQ tablet, Take 1 tablet (20 mEq total) by mouth daily., Disp: 30 tablet, Rfl: 6   pravastatin (PRAVACHOL) 20 MG tablet, Take 20 mg by mouth daily., Disp: , Rfl:    sertraline (ZOLOFT) 100 MG tablet, Take 150 mg by mouth daily., Disp: , Rfl:    spironolactone (ALDACTONE) 25 MG tablet, Take 0.5 tablets (12.5 mg total) by mouth daily., Disp: 30 tablet, Rfl: 5   Treprostinil (TYVASO DPI MAINTENANCE KIT) 64 MCG POWD, Inhale 64 mcg into the lungs in the morning, at noon, in the evening, and at bedtime., Disp: , Rfl:    umeclidinium-vilanterol (ANORO ELLIPTA) 62.5-25 MCG/ACT  AEPB, Inhale 1 puff into the lungs daily., Disp: , Rfl:  Allergies  Allergen Reactions   Lexapro [Escitalopram] Other (See Comments)    "Made me crazy"    Lisinopril Other (See Comments)    "Angioedema"     Social History   Socioeconomic History   Marital status: Married    Spouse name: Pamela Ho   Number of children: 2   Years of education: Not on file   Highest education level: Some college, no degree  Occupational History   Occupation: unemployed  Tobacco Use   Smoking status: Former    Packs/day: 1.50    Years: 30.00    Total pack years: 45.00    Types: Cigarettes    Quit date: 08/05/2021    Years since quitting: 0.4   Smokeless tobacco: Never  Vaping Use   Vaping Use: Never used  Substance and Sexual Activity   Alcohol use: No    Alcohol/week: 0.0 standard drinks of alcohol   Drug use: No   Sexual activity: Not on file  Other Topics Concern   Not on file  Social History Narrative   Not on file   Social Determinants of Health   Financial Resource Strain: High Risk (08/07/2021)   Overall Financial Resource Strain (CARDIA)    Difficulty of Paying Living Expenses: Hard  Food Insecurity: No Food Insecurity (08/07/2021)   Hunger Vital Sign    Worried About Running Out of Food in the Last Year: Never true    [place] in the Last Year: Never true  Transportation Needs: No Transportation Needs (08/07/2021)   PRAPARE - Hydrologist (Medical): No    Lack of Transportation (Non-Medical): No  Physical Activity: Not on file  Stress: Not on file  Social Connections: Not on file  Intimate Partner Violence: Not on file    Physical Exam      Future Appointments  Date Time Provider [place]  02/01/2022  9:40 AM Larey Dresser, MD [facility] None  02/01/2022 10:30 AM Laurice Record, MD [facility]  02/08/2022 11:10 AM Milus Banister, MD [facility]     ACTION: Home visit completed

## 2022-02-01 ENCOUNTER — Ambulatory Visit: Payer: Self-pay | Admitting: Internal Medicine

## 2022-02-01 ENCOUNTER — Encounter (HOSPITAL_COMMUNITY): Payer: No Typology Code available for payment source | Admitting: Cardiology

## 2022-02-08 ENCOUNTER — Ambulatory Visit: Payer: No Typology Code available for payment source | Admitting: Gastroenterology

## 2022-02-18 ENCOUNTER — Ambulatory Visit: Payer: No Typology Code available for payment source | Admitting: Internal Medicine

## 2022-02-27 ENCOUNTER — Other Ambulatory Visit (HOSPITAL_COMMUNITY): Payer: Self-pay | Admitting: *Deleted

## 2022-02-27 ENCOUNTER — Other Ambulatory Visit (HOSPITAL_COMMUNITY): Payer: Self-pay

## 2022-02-27 MED ORDER — METOPROLOL SUCCINATE ER 50 MG PO TB24: 50.0000 mg | ORAL_TABLET | Freq: Every day | ORAL | 6 refills | Status: DC

## 2022-02-27 NOTE — Progress Notes (Signed)
Paramedicine Encounter    Patient ID: Pamela Ho, female    DOB: Feb 16, 1971, 51 y.o.   MRN: [id number]   Arrived for home visit for [place] who reports feeling okay today just anxious. She is seated at her computer chair with oxygen in place. She reports she has been wearing her oxygen more often lately due to anxiety and that her oxygen makes her feel more at ease. She stated she doesn't necessarily feel short of breath but likes to wear it just in case on some days.   She reports overall she has had no chest pain, dizziness She denied lower leg swelling. She has had some weight gain but reports she has been eating more often but says she has been trying to do better over the last few days after noting her weight was up. She says she will be trying to do better to maintain a heart healthy diet.   We discussed overall care goals and education at length and she feels better after discussion.   I obtained vitals and assessment.: WT- 177lbs BP- 140/68 HR- 56 O2- 96% RR- 16  She reports to be she has been med compliant except her Tyvaso. She says she tries to use it every now and then but it makes her cough for hours and results in a severe headache so she has been avoiding using it. She is using her Opsumit daily.   Metoprolol called in for refill today.   She reports she is 43% completed on disability.   She has Oct. 25th State Disability Determination Appointment.   We planned to complete a home visit in two weeks and we reviewed appointments. Home visit complete.   [name], [place] 02/27/2022   Patient Care Team: Rupert Stacks, MD as PCP - General (Family Medicine)  Patient Active Problem List   Diagnosis Date Noted   Rheumatism 10/09/2021   Heart failure, type unknown ([place]) 08/30/2021   Thrombocytopenia ([place]) 08/10/2021   Adrenal mass ([employer]) 08/09/2021   Pleural effusion on right 08/09/2021   Acute respiratory failure with hypoxia ([place])  08/07/2021   AKI (acute kidney injury) ([place]) 08/07/2021   Glucose intolerance 08/07/2021   Cirrhosis of liver ([place]) 08/06/2021   Generalized anxiety disorder 08/06/2021   GERD without esophagitis 08/06/2021   Hypokalemia 08/06/2021   Chronic diastolic heart failure ([place]) 08/06/2021   Elevated troponin level not due myocardial infarction 08/05/2021   Nicotine dependence, cigarettes, uncomplicated 21/22/4825   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   Hypertensive urgency 02/08/2015   Headache 02/08/2015    Current Outpatient Medications:    acetaminophen (TYLENOL) 500 MG tablet, Take 500 mg by mouth every 6 (six) hours as needed for mild pain., Disp: , Rfl:    albuterol (VENTOLIN HFA) 108 (90 Base) MCG/ACT inhaler, Inhale 2 puffs into the lungs every 6 (six) hours as needed for wheezing or shortness of breath., Disp: 8 g, Rfl: 2   clonazePAM (KLONOPIN) 0.5 MG tablet, Take 0.5 mg by mouth 2 (two) times daily as needed for anxiety., Disp: , Rfl:    furosemide (LASIX) 40 MG tablet, Take 1 tablet (40 mg total) by mouth daily., Disp: 30 tablet, Rfl: 11   macitentan (OPSUMIT) 10 MG tablet, Take 10 mg by mouth daily., Disp: , Rfl:    metoprolol succinate (TOPROL-XL) 50 MG 24 hr tablet, Take 50 mg by mouth daily., Disp: , Rfl:    omeprazole (PRILOSEC) 20 MG capsule, Take 20 mg by mouth daily., Disp: , Rfl:  potassium chloride SA (KLOR-CON M) 20 MEQ tablet, Take 1 tablet (20 mEq total) by mouth daily., Disp: 30 tablet, Rfl: 6   pravastatin (PRAVACHOL) 20 MG tablet, Take 20 mg by mouth daily., Disp: , Rfl:    sertraline (ZOLOFT) 100 MG tablet, Take 150 mg by mouth daily., Disp: , Rfl:    spironolactone (ALDACTONE) 25 MG tablet, Take 0.5 tablets (12.5 mg total) by mouth daily., Disp: 30 tablet, Rfl: 5   Treprostinil (TYVASO DPI MAINTENANCE KIT) 64 MCG POWD, Inhale 64 mcg into the lungs in the morning, at noon, in the evening, and at bedtime. (Patient not taking: Reported on 01/30/2022), Disp: , Rfl:     umeclidinium-vilanterol (ANORO ELLIPTA) 62.5-25 MCG/ACT AEPB, Inhale 1 puff into the lungs daily., Disp: , Rfl:  Allergies  Allergen Reactions   Lexapro [Escitalopram] Other (See Comments)    "Made me crazy"    Lisinopril Other (See Comments)    "Angioedema"     Social History   Socioeconomic History   Marital status: Married    Spouse name: Lyndie Vanderloop   Number of children: 2   Years of education: Not on file   Highest education level: Some college, no degree  Occupational History   Occupation: unemployed  Tobacco Use   Smoking status: Former    Packs/day: 1.50    Years: 30.00    Total pack years: 45.00    Types: Cigarettes    Quit date: 08/05/2021    Years since quitting: 0.5   Smokeless tobacco: Never  Vaping Use   Vaping Use: Never used  Substance and Sexual Activity   Alcohol use: No    Alcohol/week: 0.0 standard drinks of alcohol   Drug use: No   Sexual activity: Not on file  Other Topics Concern   Not on file  Social History Narrative   Not on file   Social Determinants of Health   Financial Resource Strain: High Risk (08/07/2021)   Overall Financial Resource Strain (CARDIA)    Difficulty of Paying Living Expenses: Hard  Food Insecurity: No Food Insecurity (08/07/2021)   Hunger Vital Sign    Worried About Running Out of Food in the Last Year: Never true    Ran Out of Food in the Last Year: Never true  Transportation Needs: No Transportation Needs (08/07/2021)   PRAPARE - Hydrologist (Medical): No    Lack of Transportation (Non-Medical): No  Physical Activity: Not on file  Stress: Not on file  Social Connections: Not on file  Intimate Partner Violence: Not on file    Physical Exam      Future Appointments  Date Time Provider [place]  02/28/2022  3:30 PM Laurice Record, MD [facility]  03/05/2022  1:30 PM Daryel November, MD [facility]  03/11/2022  1:30 PM [facility] PA/NP [facility] None      ACTION: Home visit completed

## 2022-02-28 ENCOUNTER — Ambulatory Visit: Payer: No Typology Code available for payment source | Admitting: Internal Medicine

## 2022-03-05 ENCOUNTER — Ambulatory Visit: Payer: No Typology Code available for payment source | Admitting: Gastroenterology

## 2022-03-07 NOTE — Telephone Encounter (Signed)
Advanced Heart Failure Patient Advocate Encounter   Patient was approved to receive Opsumit from [employer]. The medication will be dispensed through [facility].  Effective dates: 11/27/21 through 11/26/22  Called and spoke with [employer]racom to see if the patient has been receiving the medication. Looks like there were phone issues when the ephemera attempted to reach out for the refill this month. Called and spoke with the patient. She confirmed she has the phone number to [facility] and will likely call Monday for a refill.  Charlann Boxer, CPhT

## 2022-03-11 ENCOUNTER — Encounter (HOSPITAL_COMMUNITY): Payer: Self-pay

## 2022-03-11 ENCOUNTER — Other Ambulatory Visit (HOSPITAL_COMMUNITY): Payer: Self-pay

## 2022-03-11 ENCOUNTER — Ambulatory Visit (HOSPITAL_COMMUNITY)
Admission: RE | Admit: 2022-03-11 | Discharge: 2022-03-11 | Disposition: A | Discharge status: Home / Self Care | Payer: Medicaid Other | Source: Ambulatory Visit | Attending: Family Medicine | Admitting: Family Medicine

## 2022-03-11 VITALS — BP 114/58 | HR 55 | Wt 179.0 lb

## 2022-03-11 DIAGNOSIS — I110 Hypertensive heart disease with heart failure: Secondary | ICD-10-CM | POA: Insufficient documentation

## 2022-03-11 DIAGNOSIS — I272 Pulmonary hypertension, unspecified: Secondary | ICD-10-CM | POA: Diagnosis not present

## 2022-03-11 DIAGNOSIS — Z9981 Dependence on supplemental oxygen: Secondary | ICD-10-CM | POA: Diagnosis not present

## 2022-03-11 DIAGNOSIS — J90 Pleural effusion, not elsewhere classified: Secondary | ICD-10-CM | POA: Diagnosis not present

## 2022-03-11 DIAGNOSIS — R42 Dizziness and giddiness: Secondary | ICD-10-CM | POA: Insufficient documentation

## 2022-03-11 DIAGNOSIS — R161 Splenomegaly, not elsewhere classified: Secondary | ICD-10-CM | POA: Insufficient documentation

## 2022-03-11 DIAGNOSIS — I2721 Secondary pulmonary arterial hypertension: Secondary | ICD-10-CM | POA: Diagnosis not present

## 2022-03-11 DIAGNOSIS — K7460 Unspecified cirrhosis of liver: Secondary | ICD-10-CM

## 2022-03-11 DIAGNOSIS — K219 Gastro-esophageal reflux disease without esophagitis: Secondary | ICD-10-CM | POA: Insufficient documentation

## 2022-03-11 DIAGNOSIS — Z9889 Other specified postprocedural states: Secondary | ICD-10-CM | POA: Insufficient documentation

## 2022-03-11 DIAGNOSIS — J439 Emphysema, unspecified: Secondary | ICD-10-CM | POA: Insufficient documentation

## 2022-03-11 DIAGNOSIS — M790 Rheumatism, unspecified: Secondary | ICD-10-CM | POA: Diagnosis not present

## 2022-03-11 DIAGNOSIS — I5032 Chronic diastolic (congestive) heart failure: Secondary | ICD-10-CM | POA: Insufficient documentation

## 2022-03-11 DIAGNOSIS — Z87891 Personal history of nicotine dependence: Secondary | ICD-10-CM | POA: Insufficient documentation

## 2022-03-11 DIAGNOSIS — Z79899 Other long term (current) drug therapy: Secondary | ICD-10-CM | POA: Insufficient documentation

## 2022-03-11 DIAGNOSIS — J9611 Chronic respiratory failure with hypoxia: Secondary | ICD-10-CM | POA: Diagnosis not present

## 2022-03-11 LAB — BASIC METABOLIC PANEL
Anion gap: 12 (ref 5–15)
BUN: 12 mg/dL (ref 6–20)
CO2: 29 mmol/L (ref 22–32)
Calcium: 9.6 mg/dL (ref 8.9–10.3)
Chloride: 103 mmol/L (ref 98–111)
Creatinine, Ser: 0.86 mg/dL (ref 0.44–1.00)
GFR, Estimated: >60 mL/min (ref >60)
Glucose, Bld: 104 mg/dL — ABNORMAL HIGH (ref 70–99)
Potassium: 4.2 mmol/L (ref 3.5–5.1)
Sodium: 144 mmol/L (ref 135–145)

## 2022-03-11 LAB — BRAIN NATRIURETIC PEPTIDE: B Natriuretic Peptide: 353.6 pg/mL — ABNORMAL HIGH (ref 0.0–100.0)

## 2022-03-11 MED ORDER — FUROSEMIDE 40 MG PO TABS: ORAL_TABLET | ORAL | 11 refills | Status: DC

## 2022-03-11 NOTE — Progress Notes (Signed)
PCP: Dr Albertina Senegal HF Cardiology: Dr Aundra Dubin   HPI: Pamela Ho is a 51 y.o. with history of HTN, COPD/smoking 1.5-2 ppd until about 2/23, GERD, and cirrhosis noted since 2016 by imaging though patient unaware until 2/23.  She reports several months of worsening exertional dyspnea.  The week prior to admission in 2/23, she became short of breath with any exertion and had productive cough.  Admitted 08/05/21 with A/C HFpEF. Diuresed with IV lasix. Echo showed EF 60-65%, moderately dilated and moderately dysfunctional RV, D-shaped septum, PASP 59 mmHg, and severe RAE. Abdominal US showed cirrhosis with minimal ascites and splenomegaly was noted by CT.  NH3 was elevated so lactulose was begun initially.   CT chest showed splenomegaly with large right effusion, right paratracheal node enlarged, right-sided atelectasis, and scattered infiltration RUL.  She had a right thoracentesis with 1.3 L off, this fluid grew MAC.  HCV/HBV/HAV serologies negative.  Had [place] with  severe pulmonary hypertension, high cardiac output, and PVR 3.7. Discharge weight 181 pounds. She was sent home on oxygen.    Follow up 5/23, stable on higher dose of Tyvaso. Volume stable and Opsumit added to pulmonary hypertension regimen.  Today she returns for HF follow up with [facility] with paramedicine. Overall feeling fine but struggled taking Tyvaso 4x/day and had extreme coughing due to the powder formulation. She has since stopped Tyvaso and has been off x 1 month. She felt her breathing was better on Tyvaso and had less oxygen requirements. She has dyspnea walking on flat ground for further distances and walking up stairs. She has occasional dizziness. Denies palpitations, abnormal bleeding, CP, edema, or PND/Orthopnea. Appetite ok. No fever or chills. Weight at home 170 pounds. Takes extra Lasix occasionally.  6 minute walk (4/23): 274 m 6 minute walk (5/23): 366 m 6 minute walk (9/23): 289 m (Off Tyvaso)  Labs (2/23): anti-SCL 70 negative,  RF > 650, anti-centromere Ab negative, HIV negative Labs (3/23): K 3.7, creatinine 0.89 Labs (4/23): BNP 333 Labs (5/23): K 4.4, creatinine 1.13  PMH: 1. COPD: smoked 1.5-2 ppd x years, stopped 2/23.  CT chest with emphysema.  2. 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 PVCs 4. H/o cholecystectomy 5. Cirrhosis: Suspect NAFLD.  Never a heavy drinker.  Viral hepatitis workup negative.  - Splenomegaly 6. Psoriasis 7. Pulmonary HTN/RV failure: Echo (2/23) with EF 60-65%, D-shaped septum, moderate RV enlargement with moderately decreased RV systolic function, PASP 59 mmHg.   - V/Q scan (2/23): No evidence for chronic PE.  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   ROS: All systems negative except as listed in HPI, PMH and Problem List.  SH:  Social History   Socioeconomic History   Marital status: Married    Spouse name: Pamela Ho   Number of children: 2   Years of education: Not on file   Highest education level: Some college, no degree  Occupational History   Occupation: unemployed  Tobacco Use   Smoking status: Former    Packs/day: 1.50    Years: 30.00    Total pack years: 45.00    Types: Cigarettes    Quit date: 08/05/2021    Years since quitting: 0.5   Smokeless tobacco: Never  Vaping Use   Vaping Use: Never used  Substance and Sexual Activity   Alcohol use:  No    Alcohol/week: 0.0 standard drinks of alcohol   Drug use: No   Sexual activity: Not on file  Other Topics Concern   Not on file  Social History Narrative   Not on file   Social Determinants of Health   Financial Resource Strain: High Risk (08/07/2021)   Overall Financial Resource Strain (CARDIA)    Difficulty of Paying Living Expenses: Hard  Food Insecurity: No Food Insecurity (08/07/2021)   Hunger Vital Sign    Worried About  Running Out of Food in the Last Year: Never true    Ran Out of Food in the Last Year: Never true  Transportation Needs: No Transportation Needs (08/07/2021)   PRAPARE - Hydrologist (Medical): No    Lack of Transportation (Non-Medical): No  Physical Activity: Not on file  Stress: Not on file  Social Connections: Not on file  Intimate Partner Violence: Not on file   FH:  Family History  Problem Relation Age of Onset   Diabetes Mother    Heart disease Mother    Hyperlipidemia Mother    Diabetes Father    Cancer Father    Heart disease Father    Hyperlipidemia Father    Hypertension Father    Heart disease Maternal Grandmother    Hyperlipidemia Maternal Grandmother    Hypertension Maternal Grandmother    Heart disease Maternal Grandfather    Hyperlipidemia Maternal Grandfather    Hypertension Maternal Grandfather    Heart disease Paternal Grandmother    Hyperlipidemia Paternal Grandmother    Hypertension Paternal Grandmother    Stroke Paternal Grandmother    Diabetes Paternal Grandmother    Cancer Paternal Grandmother    Breast cancer Neg Hx    Current Outpatient Medications  Medication Sig Dispense Refill   acetaminophen (TYLENOL) 500 MG tablet Take 500 mg by mouth every 6 (six) hours as needed for mild pain.     albuterol (VENTOLIN HFA) 108 (90 Base) MCG/ACT inhaler Inhale 2 puffs into the lungs every 6 (six) hours as needed for wheezing or shortness of breath. 8 g 2   clonazePAM (KLONOPIN) 0.5 MG tablet Take 0.5 mg by mouth 2 (two) times daily as needed for anxiety.     furosemide (LASIX) 40 MG tablet Take 1 tablet (40 mg total) by mouth daily. 30 tablet 11   macitentan (OPSUMIT) 10 MG tablet Take 10 mg by mouth daily.     metoprolol succinate (TOPROL-XL) 50 MG 24 hr tablet Take 1 tablet (50 mg total) by mouth daily. 30 tablet 6   omeprazole (PRILOSEC) 20 MG capsule Take 20 mg by mouth daily.     potassium chloride SA (KLOR-CON M) 20 MEQ tablet  Take 1 tablet (20 mEq total) by mouth daily. 30 tablet 6   pravastatin (PRAVACHOL) 20 MG tablet Take 20 mg by mouth daily.     sertraline (ZOLOFT) 100 MG tablet Take 150 mg by mouth daily.     spironolactone (ALDACTONE) 25 MG tablet Take 0.5 tablets (12.5 mg total) by mouth daily. 30 tablet 5   umeclidinium-vilanterol (ANORO ELLIPTA) 62.5-25 MCG/ACT AEPB Inhale 1 puff into the lungs daily as needed.     No current facility-administered medications for this encounter.   BP (!) 114/58   Pulse (!) 55   Wt 81.2 kg (179 lb)   SpO2 95% Comment: Patient is on 3-liters of room oxygen.  BMI 32.74 kg/m   Wt Readings from Last 3 Encounters:  03/11/22 81.2  kg (179 lb)  02/27/22 80.3 kg (177 lb)  01/30/22 81.2 kg (179 lb)    PHYSICAL EXAM: General:  NAD. No resp difficulty, walked into clinic on oxugen. HEENT: Normal Neck: Supple. Thick neck. Carotids 2+ bilat; no bruits. No lymphadenopathy or thryomegaly appreciated. Cor: PMI nondisplaced. Regular rate & rhythm. No rubs, gallops or murmurs. Lungs: Clear Abdomen: Soft, nontender, nondistended. No hepatosplenomegaly. No bruits or masses. Good bowel sounds. Extremities: No cyanosis, clubbing, rash, edema Neuro: Alert & oriented x 3, cranial nerves grossly intact. Moves all 4 extremities w/o difficulty. Affect pleasant.  ASSESSMENT & PLAN: 1. Pulmonary hypertension/RV failure:  Echo in 2/23 with EF 60-65%, moderately dilated and moderately dysfunctional RV, D-shaped septum, PASP 59 mmHg, and severe RAE (no prior).  RHC after diuresis showed borderline elevated PCWP with severe pulmonary hypertension and elevated RA pressure.  Cause of pulmonary hypertension/RV failure is uncertain.  PVR was elevated but not markedly so at 3.7 WU due to high cardiac output that may be related to cirrhosis.  V/Q scan not suggestive of chronic PE.  Serologic workup was negative except for significantly elevated RF (normal CCP antibody).  It is certainly possible that  the patient could have portopulmonary hypertension (form of group 1 PH) in setting of cirrhosis. She was a heavy smoker with COPD, suspect component of group 3 PH. However, CT chest did not seem to have extensive lung parenchymal disease => she was seen by pulmonary, thought to have emphysema on chest CT. PFTs showed moderate-severe obstruction/moderate restriction.  On exam today, she is not volume overloaded.  She is on 3 L [place].  NYHA class II.  Volume mildly elevated on exam, REDs 41%.  Symptoms and 6MW previously improved after starting Tyvaso, however she is unable to tolerate medication now.    - 6MW today slightly worse than previous.  - Discussed switching to inhaled formulation for Tyvaso, she declines as she doesn't think she can do this 4x/day. Discussed with Dr. Aundra Dubin, favor starting Malvin Johns. Will hold off today with her being volume up. - Increase Lasix to 40 mg q am/20 mg q pm. BMET and BNP today, repeat BMET in 10 days. - Continue Opsumit 10 mg daily.  - Continue spironolactone 12.5 mg daily.  - Arrange for sleep study. - Continue home oxygen. Goal O2 sat > 90%, per pulm. - With marked RF elevation, would like her to see rheumatology, she cancelled her new patient appointment. Will see if we can re-schedule this.  Anti-CCP Ab was negative.  2. Cirrhosis: Cause is uncertain.  Noted by imaging back in 2016.  She has evidence for both cirrhosis and splenomegaly on imaging.  Tbili elevated, albumin low.  Viral hepatitis serologies were negative.  She denies ETOH use. ?NAFLD with progression to cirrhosis, also could be related to RV failure (though there was concern cirrhosis actually came first and led to portopulmonary hypertension).  - She has been referred to GI and has follow up soon. 3. Pleural effusion: On right, s/p thoracentesis.  Fluid grew MAC, followed by ID.  4. Chronic  hypoxemic respiratory failure: In setting of COPD as well as RV failure/pulmonary hypertension. She remains on  3L oxygen. - Followup with pulmonary.   Followup in 2-3 weeks with APP. If volume improved, start Uptravi. I have updated PharmD on her stopping her Tyvaso. Follow up in 3 months with Dr. Aundra Dubin. Continue paramedicine for now, may be able to graduate in the near future.  Pamela Ho, Pamela Ho 03/11/22

## 2022-03-11 NOTE — Progress Notes (Signed)
Six minute walk completed Patient ambulated without difficulties Pt ambulated 915f (289.667f Heart rate ranged 68-99 Oxygen ranged 77-91 on 3L and 81-88 on 5L

## 2022-03-11 NOTE — Progress Notes (Signed)
Paramedicine Encounter    Patient ID: Pamela Ho, female    DOB: [birth year], 51 y.o.   MRN: [id number]   Met with Ayisha in clinic today where she was seen by Janett Billow NP. Pamela Ho reports some dizziness, no increased shortness of breath unless she is anxious or climbing stairs. She denied chest pain or swelling. Weight is up and reds clip is 41% today noting some fluid. Maven fills own pill box- always accurate. Knows what meds to take and when. Knows her limits on fluids and salts. Reminded of appointments via [place]. Has transportation. Has housing- pending disability check. Has Medicaid. Has FNS.   Vitals obtained and labs obtained.   Janett Billow made the following med changes: Lasix 79m morning, 283mevening  Keep Potassium 2023mdaily  Will re-eval Pulm HTN meds after repeat labs.  Repeat labs in two weeks.  I plan to follow up in one to two weeks also in the home.   6 min walk test today and completed. Results: Patient ambulated without difficulties Pt ambulated [id number])57fart rate ranged 68-99 Oxygen ranged 77-91 on 3L and 81-88 on 5L  [facility]eds sent into [facility] discussed upcoming appointments and confirmed same. [facility]   Clinic visit complete.    Patient Care Team: TilleRupert Stacksas PCP - General (Family Medicine)  Patient Active Problem List   Diagnosis Date Noted   Rheumatism 10/09/2021   Heart failure, type unknown (HCC) [place]09/2023   Thrombocytopenia (HCC) [place]17/2023   Adrenal mass (HCC) [place]16/2023   Pleural effusion on right 08/09/2021   Acute respiratory failure with hypoxia (HCC) [place]14/2023   AKI (acute kidney injury) (HCC) [place]14/2023   Glucose intolerance 08/07/2021   Cirrhosis of liver (HCC) [place]13/2023   Generalized anxiety disorder 08/06/2021   GERD without esophagitis 08/06/2021   Hypokalemia 08/06/2021   Chronic diastolic heart failure (HCC) [place]13/2023   Elevated troponin level not due  myocardial infarction 08/05/2021   Nicotine dependence, cigarettes, uncomplicated 07/1224/37/8588lignant hypertensive urgency 02/08/2015   Hypertensive urgency 02/08/2015   Headache 02/08/2015    Current Outpatient Medications:    acetaminophen (TYLENOL) 500 MG tablet, Take 500 mg by mouth every 6 (six) hours as needed for mild pain., Disp: , Rfl:    albuterol (VENTOLIN HFA) 108 (90 Base) MCG/ACT inhaler, Inhale 2 puffs into the lungs every 6 (six) hours as needed for wheezing or shortness of breath., Disp: 8 g, Rfl: 2   clonazePAM (KLONOPIN) 0.5 MG tablet, Take 0.5 mg by mouth 2 (two) times daily as needed for anxiety., Disp: , Rfl:    furosemide (LASIX) 40 MG tablet, Take 1 tablet (40 mg total) by mouth in the morning AND 0.5 tablets (20 mg total) every evening., Disp: 30 tablet, Rfl: 11   macitentan (OPSUMIT) 10 MG tablet, Take 10 mg by mouth daily., Disp: , Rfl:    metoprolol succinate (TOPROL-XL) 50 MG 24 hr tablet, Take 1 tablet (50 mg total) by mouth daily., Disp: 30 tablet, Rfl: 6   omeprazole (PRILOSEC) 20 MG capsule, Take 20 mg by mouth daily., Disp: , Rfl:    potassium chloride SA (KLOR-CON M) 20 MEQ tablet, Take 1 tablet (20 mEq total) by mouth daily., Disp: 30 tablet, Rfl: 6   pravastatin (PRAVACHOL) 20 MG tablet, Take 20 mg by mouth daily., Disp: , Rfl:    sertraline (ZOLOFT) 100 MG tablet, Take 150 mg by mouth daily., Disp: , Rfl:    spironolactone (  ALDACTONE) 25 MG tablet, Take 0.5 tablets (12.5 mg total) by mouth daily., Disp: 30 tablet, Rfl: 5   umeclidinium-vilanterol (ANORO ELLIPTA) 62.5-25 MCG/ACT AEPB, Inhale 1 puff into the lungs daily as needed., Disp: , Rfl:  Allergies  Allergen Reactions   Lexapro [Escitalopram] Other (See Comments)    "Made me crazy"    Lisinopril Other (See Comments)    "Angioedema"     Social History   Socioeconomic History   Marital status: Married    Spouse name: Baeleigh Devincent   Number of children: 2   Years of education: Not on file    Highest education level: Some college, no degree  Occupational History   Occupation: unemployed  Tobacco Use   Smoking status: Former    Packs/day: 1.50    Years: 30.00    Total pack years: 45.00    Types: Cigarettes    Quit date: 08/05/2021    Years since quitting: 0.5   Smokeless tobacco: Never  Vaping Use   Vaping Use: Never used  Substance and Sexual Activity   Alcohol use: No    Alcohol/week: 0.0 standard drinks of alcohol   Drug use: No   Sexual activity: Not on file  Other Topics Concern   Not on file  Social History Narrative   Not on file   Social Determinants of Health   Financial Resource Strain: High Risk (08/07/2021)   Overall Financial Resource Strain (CARDIA)    Difficulty of Paying Living Expenses: Hard  Food Insecurity: No Food Insecurity (08/07/2021)   Hunger Vital Sign    Worried About Running Out of Food in the Last Year: Never true    Ran Out of Food in the Last Year: Never true  Transportation Needs: No Transportation Needs (08/07/2021)   PRAPARE - Hydrologist (Medical): No    Lack of Transportation (Non-Medical): No  Physical Activity: Not on file  Stress: Not on file  Social Connections: Not on file  Intimate Partner Violence: Not on file    Physical Exam      Future Appointments  Date Time Provider [place]  03/18/2022  1:45 PM Laurice Record, MD [facility]  03/26/2022  1:45 PM [facility] LAB [facility] None  04/01/2022  2:30 PM [facility] PA/NP [facility] None  04/02/2022  9:50 AM Daryel November, MD [facility]  06/11/2022  3:00 PM Larey Dresser, MD [facility] None     ACTION: Home visit completed

## 2022-03-11 NOTE — Patient Instructions (Addendum)
Thank you for coming in today  Labs were done today, if any labs are abnormal the clinic will call you No news is good news   STOP Tyvaso INCREASE Lasix to 40 mg every morning and 20 mg 1/2 tablet every evening   You have been referred for a sleep study the sleep center will call you for further appointment details   You have been referred to Rheumatology their office will contact you for further appointment details  Your physician recommends that you return for lab work in: 2 weeks BMET  Your physician recommends that you schedule a follow-up appointment in:  2-3 weeks in clinic  3 months with Dr. Aundra Dubin    Do the following things EVERYDAY: Weigh yourself in the morning before breakfast. Write it down and keep it in a log. Take your medicines as prescribed Eat low salt foods--Limit salt (sodium) to 2000 mg per day.  Stay as active as you can everyday Limit all fluids for the day to less than 2 liters   At the [facility], you and your health needs are our priority. As part of our continuing mission to provide you with exceptional heart care, we have created designated Provider Care Teams. These Care Teams include your primary Cardiologist (physician) and Advanced Practice Providers (APPs- Physician Assistants and Nurse Practitioners) who all work together to provide you with the care you need, when you need it.   You may see any of the following providers on your designated Care Team at your next follow up: Dr Glori Bickers Dr Loralie Champagne Dr. Roxana Hires, NP Lyda Jester, [place] [facility] [place], [place] Forestine Na, NP Audry Riles, PharmD   Please be sure to bring in all your medications bottles to every appointment.   If you have any questions or concerns before your next appointment please send [place] a message through [place] or call our office at [phone].    TO LEAVE A MESSAGE FOR THE NURSE SELECT OPTION 2, PLEASE  LEAVE A MESSAGE INCLUDING: YOUR NAME DATE OF BIRTH CALL BACK NUMBER REASON FOR CALL**this is important as we prioritize the call backs  YOU WILL RECEIVE A CALL BACK THE SAME DAY AS LONG AS YOU CALL BEFORE 4:00 PM

## 2022-03-11 NOTE — Progress Notes (Signed)
ReDS Vest / Clip - 03/11/22 1300       ReDS Vest / Clip   Station Marker B    Ruler Value 32    ReDS Value Range High volume overload    ReDS Actual Value 41    Anatomical Comments sitting

## 2022-03-18 ENCOUNTER — Other Ambulatory Visit: Payer: Self-pay

## 2022-03-18 ENCOUNTER — Encounter: Payer: Self-pay | Admitting: Internal Medicine

## 2022-03-18 ENCOUNTER — Ambulatory Visit (INDEPENDENT_AMBULATORY_CARE_PROVIDER_SITE_OTHER): Payer: Medicaid Other | Admitting: Internal Medicine

## 2022-03-18 VITALS — BP 127/76 | HR 52 | Temp 97.8°F | Resp 16 | Wt 177.0 lb

## 2022-03-18 DIAGNOSIS — A310 Pulmonary mycobacterial infection: Secondary | ICD-10-CM | POA: Diagnosis not present

## 2022-03-18 NOTE — Progress Notes (Signed)
Patient Active Problem List   Diagnosis Date Noted   Rheumatism 10/09/2021   Heart failure, type unknown ([place]) 08/30/2021   Thrombocytopenia ([place]) 08/10/2021   Adrenal mass ([place]) 08/09/2021   Pleural effusion on right 08/09/2021   Acute respiratory failure with hypoxia ([place]) 08/07/2021   AKI (acute kidney injury) ([place]) 08/07/2021   Glucose intolerance 08/07/2021   Cirrhosis of liver ([place]) 08/06/2021   Generalized anxiety disorder 08/06/2021   GERD without esophagitis 08/06/2021   Hypokalemia 08/06/2021   Chronic diastolic heart failure ([place]) 08/06/2021   Elevated troponin level not due myocardial infarction 08/05/2021   Nicotine dependence, cigarettes, uncomplicated 26/33/3545   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   Hypertensive urgency 02/08/2015   Headache 02/08/2015    Patient's Medications  New Prescriptions   No medications on file  Previous Medications   ACETAMINOPHEN (TYLENOL) 500 MG TABLET    Take 500 mg by mouth every 6 (six) hours as needed for mild pain.   ALBUTEROL (VENTOLIN HFA) 108 (90 BASE) MCG/ACT INHALER    Inhale 2 puffs into the lungs every 6 (six) hours as needed for wheezing or shortness of breath.   CLONAZEPAM (KLONOPIN) 0.5 MG TABLET    Take 0.5 mg by mouth 2 (two) times daily as needed for anxiety.   FUROSEMIDE (LASIX) 40 MG TABLET    Take 1 tablet (40 mg total) by mouth in the morning AND 0.5 tablets (20 mg total) every evening.   MACITENTAN (OPSUMIT) 10 MG TABLET    Take 10 mg by mouth daily.   METOPROLOL SUCCINATE (TOPROL-XL) 50 MG 24 HR TABLET    Take 1 tablet (50 mg total) by mouth daily.   OMEPRAZOLE (PRILOSEC) 20 MG CAPSULE    Take 20 mg by mouth daily.   POTASSIUM CHLORIDE SA (KLOR-CON M) 20 MEQ TABLET    Take 1 tablet (20 mEq total) by mouth daily.   PRAVASTATIN (PRAVACHOL) 20 MG TABLET    Take 20 mg by mouth daily.   SERTRALINE (ZOLOFT) 100 MG TABLET    Take 150 mg by mouth daily.   SPIRONOLACTONE (ALDACTONE) 25 MG TABLET     Take 0.5 tablets (12.5 mg total) by mouth daily.   UMECLIDINIUM-VILANTEROL (ANORO ELLIPTA) 62.5-25 MCG/ACT AEPB    Inhale 1 puff into the lungs daily as needed.  Modified Medications   No medications on file  Discontinued Medications   No medications on file    Subjective: Pamela Ho is a 51 y.o. F with PMHX as below admitted to [facility] 2/13-19 for acute respiratory failure felt to be multifactorial in the setting of pulmonary edema and underlying COPD.  CT showed large right pleural effusion, with complete atelectasis of right middle and lower lobe. Non-specific right paratracheal LN enlarged, scattered infiltrate RUL. Pt underwent thoracentesis. 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 During hospitalization she was noted to have acute on chronic heart failure with 6.7L diuresis then transition to lasix.  09/11/21: PT presnts with husband. She is on 2L O2 She reports her breathing has improved significantly since discharge.  Pt was supposed to drop off AFB sputum Cx  Today 03/18/22:She has not had a cough as such did not drop off sputum samples. Breathing is baseline.     Review of Systems: Review of Systems  All other systems reviewed and are negative.   Past Medical History:  Diagnosis Date   Acute congestive heart failure ([place]) 08/05/2021  Anemia    Anxiety    COPD (chronic obstructive pulmonary disease) (HCC)    Depression    GERD (gastroesophageal reflux disease)    Heart murmur    Hypertension     Social History   Tobacco Use   Smoking status: Former    Packs/day: 1.50    Years: 30.00    Total pack years: 45.00    Types: Cigarettes    Quit date: 08/05/2021    Years since quitting: 0.6   Smokeless tobacco: Never  Vaping Use   Vaping Use: Never used  Substance Use Topics   Alcohol use: No    Alcohol/week: 0.0 standard drinks of alcohol   Drug use: No    Family History  Problem Relation Age of Onset   Diabetes  Mother    Heart disease Mother    Hyperlipidemia Mother    Diabetes Father    Cancer Father    Heart disease Father    Hyperlipidemia Father    Hypertension Father    Heart disease Maternal Grandmother    Hyperlipidemia Maternal Grandmother    Hypertension Maternal Grandmother    Heart disease Maternal Grandfather    Hyperlipidemia Maternal Grandfather    Hypertension Maternal Grandfather    Heart disease Paternal Grandmother    Hyperlipidemia Paternal Grandmother    Hypertension Paternal Grandmother    Stroke Paternal Grandmother    Diabetes Paternal Grandmother    Cancer Paternal Grandmother    Breast cancer Neg Hx     Allergies  Allergen Reactions   Lexapro [Escitalopram] Other (See Comments)    "Made me crazy"    Lisinopril Other (See Comments)    "Angioedema"    Health Maintenance  Topic Date Due   TETANUS/TDAP  Never done   PAP SMEAR-Modifier  Never done   COLONOSCOPY (Pts 45-67yr Insurance coverage will need to be confirmed)  Never done   MAMMOGRAM  06/08/2019   Zoster Vaccines- Shingrix (1 of 2) Never done   COVID-19 Vaccine (3 - Pfizer series) 07/18/2020   INFLUENZA VACCINE  01/22/2022   Hepatitis C Screening  Completed   HIV Screening  Completed   HPV VACCINES  Aged Out    Objective:  There were no vitals filed for this visit. There is no height or weight on file to calculate BMI.  Physical Exam Constitutional:      Appearance: Normal appearance.  HENT:     Head: Normocephalic and atraumatic.     Right Ear: Tympanic membrane normal.     Left Ear: Tympanic membrane normal.     Nose: Nose normal.     Mouth/Throat:     Mouth: Mucous membranes are moist.  Eyes:     Extraocular Movements: Extraocular movements intact.     Conjunctiva/sclera: Conjunctivae normal.     Pupils: Pupils are equal, round, and reactive to light.  Cardiovascular:     Rate and Rhythm: Normal rate and regular rhythm.     Heart sounds: No murmur heard.    No friction rub.  No gallop.  Pulmonary:     Effort: Pulmonary effort is normal.     Breath sounds: Normal breath sounds.  Abdominal:     General: Abdomen is flat.     Palpations: Abdomen is soft.  Musculoskeletal:        General: Normal range of motion.  Skin:    General: Skin is warm and dry.  Neurological:     General: No focal deficit present.  Mental Status: She is alert and oriented to person, place, and time.  Psychiatric:        Mood and Affect: Mood normal.     Lab Results Lab Results  Component Value Date   WBC 5.9 08/09/2021   HGB 12.6 08/10/2021   HCT 37.0 08/10/2021   MCV 81.4 08/09/2021   PLT 95 (L) 08/09/2021    Lab Results  Component Value Date   CREATININE 0.86 03/11/2022   BUN 12 03/11/2022   NA 144 03/11/2022   K 4.2 03/11/2022   CL 103 03/11/2022   CO2 29 03/11/2022    Lab Results  Component Value Date   ALT 29 08/06/2021   AST 21 08/06/2021   ALKPHOS 77 08/06/2021   BILITOT 2.2 (H) 08/06/2021    No results found for: "CHOL", "HDL", "LDLCALC", "LDLDIRECT", "TRIG", "CHOLHDL" No results found for: "LABRPR", "RPRTITER" No results found for: "HIV1RNAQUANT", "HIV1RNAVL", "CD4TABS"  A/P #Pulmonary MAC -Pt's recent hospitalization for acute respiratory failure can be attributed to volume overload vs COPD exacerbation -We discussed that given her imaging not showing extensive nodular disease or cavitary lesion and AFB smear negative(Thoracentesis), she can hold off on treatment Plan: -Continue to monitor clinically(Breathing baseline 2-3L O2), pt unable to give sputum  for AFB Cx as she does not have a cough. Will hold off on re-order as she is not able to produce sputum for Cx.  -Repeat imaging CT chest for progression of disease -Follow-up in 3 months    Laurice Record, MD [facility] 03/18/2022, 1:38 PM

## 2022-03-20 ENCOUNTER — Telehealth (HOSPITAL_COMMUNITY): Payer: Self-pay

## 2022-03-20 NOTE — Telephone Encounter (Signed)
Called Mrs. Mun to set up a home paramedicine visit and she reported she was out of town today and wouldn't be available. She reported that she is feeling much better after increasing her diuretics and stopping the Tyvaso completely. She says she lost 4-5 lbs and has not needed her oxygen except at bedtime sometimes. She reports the plans to be at clinic follow up next week. We will plan to meet there. She knows to reach out if needed. Call complete.   [name], [place] 03/20/2022

## 2022-03-25 ENCOUNTER — Encounter (HOSPITAL_COMMUNITY): Payer: No Typology Code available for payment source | Admitting: Cardiology

## 2022-03-26 ENCOUNTER — Telehealth (HOSPITAL_COMMUNITY): Payer: Self-pay

## 2022-03-26 ENCOUNTER — Ambulatory Visit (HOSPITAL_COMMUNITY)
Admission: RE | Admit: 2022-03-26 | Discharge: 2022-03-26 | Disposition: A | Discharge status: Home / Self Care | Payer: 59 | Source: Ambulatory Visit | Attending: Cardiology | Admitting: Cardiology

## 2022-03-26 DIAGNOSIS — I5032 Chronic diastolic (congestive) heart failure: Secondary | ICD-10-CM | POA: Insufficient documentation

## 2022-03-26 LAB — BASIC METABOLIC PANEL
Anion gap: 8 (ref 5–15)
BUN: 22 mg/dL — ABNORMAL HIGH (ref 6–20)
CO2: 27 mmol/L (ref 22–32)
Calcium: 9.5 mg/dL (ref 8.9–10.3)
Chloride: 104 mmol/L (ref 98–111)
Creatinine, Ser: 1.04 mg/dL — ABNORMAL HIGH (ref 0.44–1.00)
GFR, Estimated: >60 mL/min (ref >60)
Glucose, Bld: 165 mg/dL — ABNORMAL HIGH (ref 70–99)
Potassium: 3.5 mmol/L (ref 3.5–5.1)
Sodium: 139 mmol/L (ref 135–145)

## 2022-03-26 NOTE — Telephone Encounter (Signed)
Talked with Pamela Ho and confirmed her labs looked okay per Allena Katz NP. I asked about a home visit for tomorrow and she said she wants to just meet in the clinic on Monday. She reports she is feeling better and has no complaints at this time and has been going without needing her oxygen lately. She reports she feels much better. Plans to graduate in clinic on Monday pending no changes. She agreed with same. Call complete.   [name], [place] 03/26/2022

## 2022-03-28 ENCOUNTER — Ambulatory Visit (HOSPITAL_COMMUNITY)
Admission: RE | Admit: 2022-03-28 | Discharge: 2022-03-28 | Disposition: A | Discharge status: Home / Self Care | Payer: 59 | Source: Ambulatory Visit | Attending: Internal Medicine | Admitting: Internal Medicine

## 2022-03-28 DIAGNOSIS — A310 Pulmonary mycobacterial infection: Secondary | ICD-10-CM | POA: Insufficient documentation

## 2022-03-28 DIAGNOSIS — J90 Pleural effusion, not elsewhere classified: Secondary | ICD-10-CM | POA: Diagnosis not present

## 2022-03-28 DIAGNOSIS — J439 Emphysema, unspecified: Secondary | ICD-10-CM | POA: Diagnosis not present

## 2022-03-28 MED ORDER — IOHEXOL 350 MG/ML SOLN
50.0000 mL | Freq: Once | INTRAVENOUS | Status: AC | PRN
  Administered 2022-03-28: 50 mL via INTRAVENOUS

## 2022-03-31 NOTE — Progress Notes (Signed)
PCP: Dr Albertina Senegal HF Cardiology: Dr Aundra Dubin   HPI: Ms Westerfield is a 51 y.o. with history of HTN, COPD/smoking 1.5-2 ppd until about 2/23, GERD, and cirrhosis noted since 2016 by imaging though patient unaware until 2/23.    Admitted 08/05/21 with A/C HFpEF. Diuresed with IV lasix. Echo showed EF 60-65%, moderately dilated and moderately dysfunctional RV, D-shaped septum, PASP 59 mmHg, and severe RAE. Abdominal US showed cirrhosis with minimal ascites and splenomegaly was noted by CT.  CT chest showed splenomegaly with large right effusion, right paratracheal node enlarged, right-sided atelectasis, and scattered infiltration RUL.  She had a right thoracentesis with 1.3 L off, this fluid grew MAC.  HCV/HBV/HAV serologies negative.  Had [place] with  severe pulmonary hypertension, high cardiac output, and PVR 3.7. Discharge weight 181 pounds. She was sent home on oxygen.    Follow up 5/23, stable on higher dose of Tyvaso. Volume stable and Opsumit added to pulmonary hypertension regimen.  She was last seen for f/u 09/18. Appeared volume up, ReDS 41%. Lasix increased. Struggled taking Tyvaso 4x/day and had extreme coughing due to the powder formulation.  Discussed with Dr. Aundra Dubin. Recommended switching to Uptravi once volume improved.   Here today for close f/u. Reports home weight has been averaging between 176-178 lb. Notes dyspnea with exertion was somewhat better when she was on Tyvaso. Denies orthopnea, PND or LE edema. Wears O2 at night and some during the day. Has been followed by Paramedicine.  6 minute walk (4/23): 274 m 6 minute walk (5/23): 366 m 6 minute walk (9/23): 289 m (Off Tyvaso)  Labs (2/23): anti-SCL 70 negative, RF > 650, anti-centromere Ab negative, HIV negative Labs (3/23): K 3.7, creatinine 0.89 Labs (4/23): BNP 333 Labs (5/23): K 4.4, creatinine 1.13 Labs (10/23) K 3.5, BUN 22, Cr 1.04  PMH: 1. COPD: smoked 1.5-2 ppd x years, stopped 2/23.  CT chest with emphysema.  2. 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 PVCs 4. H/o cholecystectomy 5. Cirrhosis: Suspect NAFLD.  Never a heavy drinker.  Viral hepatitis workup negative.  - Splenomegaly 6. Psoriasis 7. Pulmonary HTN/RV failure: Echo (2/23) with EF 60-65%, D-shaped septum, moderate RV enlargement with moderately decreased RV systolic function, PASP 59 mmHg.   - V/Q scan (2/23): No evidence for chronic PE.  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   ROS: All systems negative except as listed in HPI, PMH and Problem List.  SH:  Social History   Socioeconomic History   Marital status: Married    Spouse name: Maguire Killmer   Number of children: 2   Years of education: Not on file   Highest education level: Some college, no degree  Occupational History   Occupation: unemployed  Tobacco Use   Smoking status: Former    Packs/day: 1.50    Years: 30.00    Total pack years: 45.00    Types: Cigarettes    Quit date: 08/05/2021    Years since quitting: 0.6   Smokeless tobacco: Never  Vaping Use   Vaping Use: Never used  Substance and Sexual Activity   Alcohol use: No    Alcohol/week: 0.0 standard drinks of alcohol   Drug use: No   Sexual activity: Not on file  Other Topics Concern   Not on file  Social History Narrative  Not on file   Social Determinants of Health   Financial Resource Strain: High Risk (08/07/2021)   Overall Financial Resource Strain (CARDIA)    Difficulty of Paying Living Expenses: Hard  Food Insecurity: No Food Insecurity (08/07/2021)   Hunger Vital Sign    Worried About Running Out of Food in the Last Year: Never true    Ran Out of Food in the Last Year: Never true  Transportation Needs: No Transportation Needs (08/07/2021)   PRAPARE - Hydrologist (Medical): No    Lack of  Transportation (Non-Medical): No  Physical Activity: Not on file  Stress: Not on file  Social Connections: Not on file  Intimate Partner Violence: Not on file   FH:  Family History  Problem Relation Age of Onset   Diabetes Mother    Heart disease Mother    Hyperlipidemia Mother    Diabetes Father    Cancer Father    Heart disease Father    Hyperlipidemia Father    Hypertension Father    Heart disease Maternal Grandmother    Hyperlipidemia Maternal Grandmother    Hypertension Maternal Grandmother    Heart disease Maternal Grandfather    Hyperlipidemia Maternal Grandfather    Hypertension Maternal Grandfather    Heart disease Paternal Grandmother    Hyperlipidemia Paternal Grandmother    Hypertension Paternal Grandmother    Stroke Paternal Grandmother    Diabetes Paternal Grandmother    Cancer Paternal Grandmother    Breast cancer Neg Hx    Current Outpatient Medications  Medication Sig Dispense Refill   acetaminophen (TYLENOL) 500 MG tablet Take 500 mg by mouth every 6 (six) hours as needed for mild pain.     albuterol (VENTOLIN HFA) 108 (90 Base) MCG/ACT inhaler Inhale 2 puffs into the lungs every 6 (six) hours as needed for wheezing or shortness of breath. 8 g 2   clonazePAM (KLONOPIN) 0.5 MG tablet Take 0.5 mg by mouth 2 (two) times daily as needed for anxiety.     furosemide (LASIX) 40 MG tablet Take 1 tablet (40 mg total) by mouth in the morning AND 0.5 tablets (20 mg total) every evening. 30 tablet 11   macitentan (OPSUMIT) 10 MG tablet Take 10 mg by mouth daily.     metoprolol succinate (TOPROL-XL) 50 MG 24 hr tablet Take 1 tablet (50 mg total) by mouth daily. 30 tablet 6   omeprazole (PRILOSEC) 20 MG capsule Take 20 mg by mouth daily.     potassium chloride SA (KLOR-CON M) 20 MEQ tablet Take 1 tablet (20 mEq total) by mouth daily. 30 tablet 6   pravastatin (PRAVACHOL) 20 MG tablet Take 20 mg by mouth daily.     sertraline (ZOLOFT) 100 MG tablet Take 150 mg by mouth  daily.     spironolactone (ALDACTONE) 25 MG tablet Take 0.5 tablets (12.5 mg total) by mouth daily. 30 tablet 5   umeclidinium-vilanterol (ANORO ELLIPTA) 62.5-25 MCG/ACT AEPB Inhale 1 puff into the lungs daily as needed.     No current facility-administered medications for this encounter.   BP 128/60   Pulse (!) 59   Wt 82 kg (180 lb 12.8 oz)   SpO2 97% Comment: Patient is on 3-liters of room oxygen.  BMI 33.07 kg/m   Wt Readings from Last 3 Encounters:  04/01/22 82 kg (180 lb 12.8 oz)  03/18/22 80.3 kg (177 lb)  03/11/22 81.2 kg (179 lb)    ReDS Vest / Clip - 04/01/22  1500       ReDS Vest / Radio broadcast assistant Value 24    ReDS Value Range High volume overload    ReDS Actual Value 46            PHYSICAL EXAM: General:  Well appearing. Ambulated into room, on 3L O2 [place] HEENT: normal Neck: supple. JVD difficult to assess d/t thick neck. Carotids 2+ bilat; no bruits.  Cor: PMI nondisplaced. Regular rate & rhythm. No rubs, gallops or murmurs. Lungs: clear Abdomen: soft, nontender, nondistended.  Extremities: no cyanosis, clubbing, rash, trace edema Neuro: alert & orientedx3, cranial nerves grossly intact. moves all 4 extremities w/o difficulty. Affect pleasant   ASSESSMENT & PLAN: 1. Pulmonary hypertension/Chronic diastolic CHF with RV failure:  Echo in 2/23 with EF 60-65%, moderately dilated and moderately dysfunctional RV, D-shaped septum, PASP 59 mmHg, and severe RAE (no prior).  RHC after diuresis showed borderline elevated PCWP with severe pulmonary hypertension and elevated RA pressure.  Cause of pulmonary hypertension/RV failure is uncertain.  PVR was elevated but not markedly so at 3.7 WU due to high cardiac output that may be related to cirrhosis.  V/Q scan not suggestive of chronic PE.  Serologic workup was negative except for significantly elevated RF (normal CCP antibody).  It is certainly possible that the patient could have portopulmonary hypertension  (form of group 1 PH) in setting of cirrhosis. She was a heavy smoker with COPD, suspect component of group 3 PH. However, CT chest did not seem to have extensive lung parenchymal disease => she was seen by pulmonary, thought to have emphysema on chest CT. PFTs showed moderate-severe obstruction/moderate restriction.  On exam today, she is not volume overloaded.  ? ReDS 46% (suspect falsely elevated, especially after recently increasing diuretics). Check BNP/BMP today. She is on 3 L [place].  NYHA class II/early 3.  - Symptoms and 6MW previously improved after starting Tyvaso, however she is unable to tolerate medication now.   6MW slightly worse 09/23 (off Tyvaso) - Discussed switching to inhaled formulation for Tyvaso, she declines as she doesn't think she can do this 4x/day. Discussed with Dr. Aundra Dubin, recommend starting Uptravi alternatively. PharmD will assist with arranging through specialty pharmacy. - Prescribed lasix 40 mg q am and 20 mg q pm. Taking lasix as 60 mg daily. Continue current dose. - Continue Opsumit 10 mg daily.  - Continue spironolactone 12.5 mg daily.  - Referred for home sleep study at last visit but has not been scheduled. Will see if this can be arranged. - Continue home oxygen. Goal O2 sat > 90%, per pulm. - Anti-CCP Ab was negative. With marked RF elevation, would like her to see rheumatology, she cancelled her new patient appointment. She was recently referred back to Rheum 2. Cirrhosis: Cause is uncertain.  Noted by imaging back in 2016.  She has evidence for both cirrhosis and splenomegaly on imaging.  Tbili elevated, albumin low.  Viral hepatitis serologies were negative.  She denies ETOH use. ?NAFLD with progression to cirrhosis, also could be related to RV failure (though there was concern cirrhosis actually came first and led to portopulmonary hypertension).  - She has been referred to GI and has follow up soon. 3. 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   Fluid grew MAC, followed by ID. Not currently requiring treatment. 4. Chronic  hypoxemic respiratory failure: In setting of COPD as well as RV failure/pulmonary hypertension. She remains on 3L oxygen. - Followup  with pulmonary.   Follow-up: December 2023 as scheduled with Dr. Aundra Dubin. Continue paramedicine for now, may be able to graduate in the near future.  Marlyce Huge, PA-C 04/01/22

## 2022-04-01 ENCOUNTER — Ambulatory Visit (HOSPITAL_COMMUNITY)
Admission: RE | Admit: 2022-04-01 | Discharge: 2022-04-01 | Disposition: A | Discharge status: Home / Self Care | Payer: 59 | Source: Ambulatory Visit | Attending: Physician Assistant | Admitting: Physician Assistant

## 2022-04-01 ENCOUNTER — Other Ambulatory Visit (HOSPITAL_COMMUNITY): Payer: Self-pay

## 2022-04-01 ENCOUNTER — Encounter (HOSPITAL_COMMUNITY): Payer: Self-pay

## 2022-04-01 ENCOUNTER — Telehealth (HOSPITAL_COMMUNITY): Payer: Self-pay | Admitting: Pharmacist

## 2022-04-01 ENCOUNTER — Telehealth (HOSPITAL_COMMUNITY): Payer: Self-pay | Admitting: Pharmacy Technician

## 2022-04-01 VITALS — BP 128/60 | HR 59 | Wt 180.8 lb

## 2022-04-01 DIAGNOSIS — J9691 Respiratory failure, unspecified with hypoxia: Secondary | ICD-10-CM | POA: Insufficient documentation

## 2022-04-01 DIAGNOSIS — R161 Splenomegaly, not elsewhere classified: Secondary | ICD-10-CM | POA: Diagnosis not present

## 2022-04-01 DIAGNOSIS — I110 Hypertensive heart disease with heart failure: Secondary | ICD-10-CM | POA: Diagnosis not present

## 2022-04-01 DIAGNOSIS — J9611 Chronic respiratory failure with hypoxia: Secondary | ICD-10-CM | POA: Diagnosis not present

## 2022-04-01 DIAGNOSIS — Z9981 Dependence on supplemental oxygen: Secondary | ICD-10-CM | POA: Diagnosis not present

## 2022-04-01 DIAGNOSIS — Z5986 Financial insecurity: Secondary | ICD-10-CM | POA: Diagnosis not present

## 2022-04-01 DIAGNOSIS — Z87891 Personal history of nicotine dependence: Secondary | ICD-10-CM | POA: Insufficient documentation

## 2022-04-01 DIAGNOSIS — J449 Chronic obstructive pulmonary disease, unspecified: Secondary | ICD-10-CM | POA: Diagnosis not present

## 2022-04-01 DIAGNOSIS — I50810 Right heart failure, unspecified: Secondary | ICD-10-CM

## 2022-04-01 DIAGNOSIS — I272 Pulmonary hypertension, unspecified: Secondary | ICD-10-CM | POA: Insufficient documentation

## 2022-04-01 DIAGNOSIS — K219 Gastro-esophageal reflux disease without esophagitis: Secondary | ICD-10-CM | POA: Insufficient documentation

## 2022-04-01 DIAGNOSIS — Z79899 Other long term (current) drug therapy: Secondary | ICD-10-CM | POA: Insufficient documentation

## 2022-04-01 DIAGNOSIS — I5032 Chronic diastolic (congestive) heart failure: Secondary | ICD-10-CM | POA: Diagnosis not present

## 2022-04-01 DIAGNOSIS — K7460 Unspecified cirrhosis of liver: Secondary | ICD-10-CM | POA: Insufficient documentation

## 2022-04-01 LAB — COMPREHENSIVE METABOLIC PANEL
ALT: 18 U/L (ref 0–44)
AST: 16 U/L (ref 15–41)
Albumin: 3.2 g/dL — ABNORMAL LOW (ref 3.5–5.0)
Alkaline Phosphatase: 101 U/L (ref 38–126)
Anion gap: 8 (ref 5–15)
BUN: 11 mg/dL (ref 6–20)
CO2: 31 mmol/L (ref 22–32)
Calcium: 9.2 mg/dL (ref 8.9–10.3)
Chloride: 101 mmol/L (ref 98–111)
Creatinine, Ser: 1.38 mg/dL — ABNORMAL HIGH (ref 0.44–1.00)
GFR, Estimated: 46 mL/min — ABNORMAL LOW (ref >60)
Glucose, Bld: 104 mg/dL — ABNORMAL HIGH (ref 70–99)
Potassium: 3.3 mmol/L — ABNORMAL LOW (ref 3.5–5.1)
Sodium: 140 mmol/L (ref 135–145)
Total Bilirubin: 0.6 mg/dL (ref 0.3–1.2)
Total Protein: 6.5 g/dL (ref 6.5–8.1)

## 2022-04-01 LAB — BRAIN NATRIURETIC PEPTIDE: B Natriuretic Peptide: 230.2 pg/mL — ABNORMAL HIGH (ref 0.0–100.0)

## 2022-04-01 NOTE — Telephone Encounter (Signed)
Advanced Heart Failure Patient Advocate Encounter  Patient is being started on Uptravi. Pamela Ho referral started, will fax in once signatures are obtained.

## 2022-04-01 NOTE — Telephone Encounter (Signed)
Patient Advocate Encounter   Received notification that prior authorization for Pamela Ho is required.   PA submitted on CoverMyMeds Key [id number] Status is pending   Will continue to follow.   Audry Riles, PharmD, BCPS, BCCP, CPP Heart Failure Clinic Pharmacist [phone]

## 2022-04-01 NOTE — Progress Notes (Signed)
ReDS Vest / Clip - 04/01/22 1500       ReDS Vest / Clip   Station Marker A    Ruler Value 24    ReDS Value Range High volume overload    ReDS Actual Value 46

## 2022-04-01 NOTE — Patient Instructions (Addendum)
Thank you for coming in today  Labs were done today, if any labs are abnormal the clinic will call you No news is good news  You have been referred for a sleep study and will be called to make appointment  Your physician recommends that you schedule a follow-up appointment in:  Keep follow up appointment      Do the following things EVERYDAY: Weigh yourself in the morning before breakfast. Write it down and keep it in a log. Take your medicines as prescribed Eat low salt foods--Limit salt (sodium) to 2000 mg per day.  Stay as active as you can everyday Limit all fluids for the day to less than 2 liters   At the [facility], you and your health needs are our priority. As part of our continuing mission to provide you with exceptional heart care, we have created designated Provider Care Teams. These Care Teams include your primary Cardiologist (physician) and Advanced Practice Providers (APPs- Physician Assistants and Nurse Practitioners) who all work together to provide you with the care you need, when you need it.   You may see any of the following providers on your designated Care Team at your next follow up: Dr Glori Bickers Dr Loralie Champagne Dr. Roxana Hires, NP Lyda Jester, [place] [facility] [place], [place] Forestine Na, NP Audry Riles, PharmD   Please be sure to bring in all your medications bottles to every appointment.   If you have any questions or concerns before your next appointment please send [place] a message through Cable or call our office at [phone].    TO LEAVE A MESSAGE FOR THE NURSE SELECT OPTION 2, PLEASE LEAVE A MESSAGE INCLUDING: YOUR NAME DATE OF BIRTH CALL BACK NUMBER REASON FOR CALL**this is important as we prioritize the call backs  YOU WILL RECEIVE A CALL BACK THE SAME DAY AS LONG AS YOU CALL BEFORE 4:00 PM

## 2022-04-02 ENCOUNTER — Other Ambulatory Visit (INDEPENDENT_AMBULATORY_CARE_PROVIDER_SITE_OTHER): Payer: 59

## 2022-04-02 ENCOUNTER — Telehealth (HOSPITAL_COMMUNITY): Payer: Self-pay | Admitting: Surgery

## 2022-04-02 ENCOUNTER — Ambulatory Visit (INDEPENDENT_AMBULATORY_CARE_PROVIDER_SITE_OTHER): Payer: 59 | Admitting: Gastroenterology

## 2022-04-02 ENCOUNTER — Other Ambulatory Visit (HOSPITAL_COMMUNITY): Payer: Self-pay

## 2022-04-02 ENCOUNTER — Encounter: Payer: Self-pay | Admitting: Gastroenterology

## 2022-04-02 VITALS — BP 112/76 | HR 55 | Ht 62.0 in | Wt 182.0 lb

## 2022-04-02 DIAGNOSIS — R197 Diarrhea, unspecified: Secondary | ICD-10-CM | POA: Diagnosis not present

## 2022-04-02 DIAGNOSIS — I5032 Chronic diastolic (congestive) heart failure: Secondary | ICD-10-CM

## 2022-04-02 DIAGNOSIS — R188 Other ascites: Secondary | ICD-10-CM

## 2022-04-02 DIAGNOSIS — K7460 Unspecified cirrhosis of liver: Secondary | ICD-10-CM

## 2022-04-02 LAB — CBC WITH DIFFERENTIAL/PLATELET
Basophils Absolute: 0.1 10*3/uL (ref 0.0–0.1)
Basophils Relative: 1.9 % (ref 0.0–3.0)
Eosinophils Absolute: 0.1 10*3/uL (ref 0.0–0.7)
Eosinophils Relative: 2.4 % (ref 0.0–5.0)
HCT: 32.4 % — ABNORMAL LOW (ref 36.0–46.0)
Hemoglobin: 10.1 g/dL — ABNORMAL LOW (ref 12.0–15.0)
Lymphocytes Relative: 18.1 % (ref 12.0–46.0)
Lymphs Abs: 0.6 10*3/uL — ABNORMAL LOW (ref 0.7–4.0)
MCHC: 31 g/dL (ref 30.0–36.0)
MCV: 69.3 fl — ABNORMAL LOW (ref 78.0–100.0)
Monocytes Absolute: 0.1 10*3/uL (ref 0.1–1.0)
Monocytes Relative: 3.8 % (ref 3.0–12.0)
Neutro Abs: 2.5 10*3/uL (ref 1.4–7.7)
Neutrophils Relative %: 73.8 % (ref 43.0–77.0)
Platelets: 106 10*3/uL — ABNORMAL LOW (ref 150.0–400.0)
RBC: 4.71 Mil/uL (ref 3.87–5.11)
RDW: 19.1 % — ABNORMAL HIGH (ref 11.5–15.5)
WBC: 3.3 10*3/uL — ABNORMAL LOW (ref 4.0–10.5)

## 2022-04-02 LAB — PROTIME-INR
INR: 1 ratio (ref 0.8–1.0)
Prothrombin Time: 11.2 s (ref 9.6–13.1)

## 2022-04-02 MED ORDER — SPIRONOLACTONE 25 MG PO TABS: 25.0000 mg | ORAL_TABLET | Freq: Every day | ORAL | 5 refills | Status: DC

## 2022-04-02 MED ORDER — FUROSEMIDE 40 MG PO TABS: 40.0000 mg | ORAL_TABLET | Freq: Every day | ORAL | 11 refills | Status: DC

## 2022-04-02 NOTE — Telephone Encounter (Signed)
Advanced Heart Failure Patient Advocate Encounter  Prior Authorization for Malvin Johns has been approved.    Effective dates: 04/02/22 through 04/03/23  Audry Riles, PharmD, BCPS, BCCP, CPP Heart Failure Clinic Pharmacist [phone]

## 2022-04-02 NOTE — Progress Notes (Signed)
HPI : Pamela Ho is a very pleasant 51 year old female with a history of chronic tobacco use and COPD who was hospitalized in [place] from February 12-19, 2023 with acute right-sided heart failure after presenting with acute on chronic dyspnea, orthopnea and lower extremity edema.  An echocardiogram showed an EF of 60 to 95%, grade 2 diastolic dysfunction and an R RVSP of 59.  A CT scan without contrast showed a large right-sided pleural effusion and splenomegaly and 1.3 L of fluid was removed.  This fluid eventually grew out MAC.  She underwent diuresis with IV Lasix and subsequently underwent a right-sided heart cath which showed severe pulmonary hypertension and high cardiac output.  A right upper quadrant ultrasound during that admission showed a cirrhotic appearing liver as well as small ascites.   She was diuresed a total of 7 L.  She stopped smoking after that admission.  The patient presents to our clinic today for further evaluation and management of cirrhosis. The patient denies ever being told she had any liver problems prior to that admission in February.  The ultrasound report from that admission indicates that the liver had portions of nodularity on a CT scan from 2016.  Splenomegaly was also noted at that time.  Her platelets were not low in 2016 and her liver enzymes were normal. When she presented to the emergency department in February, her AST was 23, ALT 33, alk phos 86 and total bilirubin 3.0.  INR was 1.1.  Evaluation for viral and autoimmune hepatitis was negative  The patient does not have a history of diabetes, but has been told she is borderline diabetic.  She has always been overweight or modestly obese, with BMI around 30. The patient states that she drink in her late teens and early 96s, but has barely drink at all since then.  She has no known family history of liver disease.  Patient smoked heavily from age 7 until February of this year, typically 1/2 to 2  packs/day.  She had known COPD for many years, but was never diagnosed with right heart failure/pulmonary hypertension until readmission in February.  The patient denies any history of yellowing of the eyes or skin.  No history of dark-colored urine or clay colored stools.  She denies any history suggestive of hepatic encephalopathy such as periods of confusion, "head fog", or cognitive impairment.  She is bothered by pruritus, mostly of the upper and lower extremities and dry skin. She denies any chronic GI symptoms.  She has regular bowel movements but is sometimes bothered by diarrhea.  No problems with blood in the stool. She has never had a colonoscopy.  No family history of GI malignancy.  She has been followed closely by cardiology for her right heart failure.  She is currently on Lasix 40 mg every morning and 20 mg every evening, but she takes 60 mg every morning she had been treated with Tyvaso, but this was discontinued due to side effects (coughing, headaches).  She has been recommended to start Malvin Johns, but is awaiting insurance approval.  Her shortness of breath has been stable as of late.  She still uses 3 L of oxygen at baseline.  Past Medical History:  Diagnosis Date   Acute congestive heart failure ([place]) 08/05/2021   Anemia    Anxiety    COPD (chronic obstructive pulmonary disease) (HCC)    Depression    GERD (gastroesophageal reflux disease)    Heart murmur    Hypertension  COPD: smoked 1.5-2 ppd x years, stopped 2/23.  CT chest with emphysema.  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   Past Surgical History:  Procedure Laterality Date   CHOLECYSTECTOMY     cyst from neck     IR THORACENTESIS ASP PLEURAL SPACE W/IMG GUIDE  08/09/2021   RIGHT HEART CATH N/A 08/10/2021   Procedure: RIGHT HEART CATH;  Surgeon: Larey Dresser, MD;  Location: [facility];  Service: Cardiovascular;  Laterality: N/A;   Family History  Problem Relation Age of Onset   Diabetes Mother    Heart disease Mother    Hyperlipidemia Mother    Diabetes Father    Cancer Father    Heart disease Father    Hyperlipidemia Father    Hypertension Father    Heart disease Maternal Grandmother    Hyperlipidemia Maternal Grandmother    Hypertension Maternal Grandmother    Heart disease Maternal Grandfather    Hyperlipidemia Maternal Grandfather    Hypertension Maternal Grandfather    Heart disease Paternal Grandmother    Hyperlipidemia Paternal Grandmother    Hypertension Paternal Grandmother    Stroke Paternal Grandmother    Diabetes Paternal Grandmother    Cancer Paternal Grandmother    Breast cancer Neg Hx    Social History   Tobacco Use   Smoking status: Former    Packs/day: 1.50    Years: 30.00    Total pack years: 45.00    Types: Cigarettes    Quit date: 08/05/2021    Years since quitting: 0.6   Smokeless tobacco: Never  Vaping Use   Vaping Use: Never used  Substance Use Topics   Alcohol use: No    Alcohol/week: 0.0 standard drinks of alcohol   Drug use: No   Current Outpatient Medications  Medication Sig Dispense Refill   acetaminophen (TYLENOL) 500 MG tablet Take 500 mg by mouth every 6 (six) hours as needed for mild pain.     albuterol (VENTOLIN HFA) 108 (90 Base) MCG/ACT inhaler Inhale 2 puffs into the lungs every 6 (six) hours as needed for wheezing or shortness of breath. 8 g 2   clonazePAM (KLONOPIN) 0.5 MG tablet Take 0.5 mg by mouth 2 (two) times daily as needed for anxiety.     furosemide (LASIX) 40 MG tablet Take 1 tablet (40 mg total) by  mouth in the morning AND 0.5 tablets (20 mg total) every evening. 30 tablet 11   macitentan (OPSUMIT) 10 MG tablet Take 10 mg by mouth daily.     metoprolol succinate (TOPROL-XL) 50 MG 24 hr tablet Take 1 tablet (50 mg total) by mouth daily. 30 tablet 6   omeprazole (PRILOSEC) 20 MG capsule Take 20 mg by mouth daily.     potassium chloride SA (KLOR-CON M) 20 MEQ tablet Take 1 tablet (20 mEq total) by mouth daily. 30 tablet 6   pravastatin (PRAVACHOL) 20 MG tablet Take 20 mg by mouth daily.     sertraline (ZOLOFT) 100 MG tablet Take 150 mg by mouth daily.     spironolactone (ALDACTONE) 25 MG tablet Take 0.5  tablets (12.5 mg total) by mouth daily. 30 tablet 5   umeclidinium-vilanterol (ANORO ELLIPTA) 62.5-25 MCG/ACT AEPB Inhale 1 puff into the lungs daily as needed.     No current facility-administered medications for this visit.   Allergies  Allergen Reactions   Lexapro [Escitalopram] Other (See Comments)    "Made me crazy"    Lisinopril Other (See Comments)    "Angioedema"     Review of Systems: All systems reviewed and negative except where noted in HPI.    [place] Feb 17 1. Borderline elevated PCWP.  2. Severe pulmonary arterial hypertension.  3. Elevated RA pressure.  4. High cardiac output.  5. PVR 3.7.  Not markedly high likely due to high cardiac output, possible that cirrhosis contributes.   CT CHEST W CONTRAST  Result Date: 03/29/2022 CLINICAL DATA:  Pulmonary MAC. EXAM: CT CHEST WITH CONTRAST TECHNIQUE: Multidetector CT imaging of the chest was performed during intravenous contrast administration. RADIATION DOSE REDUCTION: This exam was performed according to the departmental dose-optimization program which includes automated exposure control, adjustment of the mA and/or kV according to patient size and/or use of iterative reconstruction technique. CONTRAST:  23m OMNIPAQUE IOHEXOL 350 MG/ML SOLN COMPARISON:  Chest CT 08/08/2021. FINDINGS: Cardiovascular: No significant  vascular findings. Normal heart size. No pericardial effusion. There are atherosclerotic calcifications of the coronary arteries. Mediastinum/Nodes: No enlarged mediastinal, hilar, or axillary lymph nodes. Thyroid gland, trachea, and esophagus demonstrate no significant findings. Lungs/Pleura: Mild emphysematous changes are present. There are minimal atelectatic changes in the right middle lobe. There is a small right pleural effusion which has significantly decreased. No new focal lung infiltrate. Trachea and central airways are patent. Upper Abdomen: Marked splenomegaly again noted. Questionable nodular liver contour. Musculoskeletal: No chest wall abnormality. No acute or significant osseous findings. IMPRESSION: 1. Small right pleural effusion has significantly decreased in size. 2. Minimal atelectatic changes in the right middle lobe. 3. Marked splenomegaly. 4. Questionable nodular liver contour. Correlate clinically for cirrhosis. Electronically Signed   By: ARonney AstersM.D.   On: 03/29/2022 21:06    Physical Exam: BP 112/76   Pulse (!) 55   Ht _0  (1.575 m)   Wt 182 lb (82.6 kg)   BMI 33.29 kg/m  Constitutional: Pleasant,well-developed, Caucasian female in no acute distress.  Accompanied by husband HEENT: Normocephalic and atraumatic. Conjunctivae are normal. No scleral icterus. Neck supple.  Cardiovascular: Normal rate, regular rhythm.  Pulmonary/chest: Effort normal and breath sounds normal. No wheezing, rales or rhonchi.  Portable oxygen via nasal cannula Abdominal: Soft, nondistended, nontender. Bowel sounds active throughout. There are no masses palpable.  No obvious ascites, umbilical hernia Extremities: Trace bilateral lower extremity edema Lymphadenopathy: No cervical adenopathy noted. Neurological: Alert and oriented to person place and time. Skin: Skin is warm and dry. Xerosis, flaking of lower extremitiesNo rashes noted.  No spider angiomata Psychiatric: Normal mood and  affect. Behavior is normal.  No asterixis  CBC    Component Value Date/Time   WBC 5.9 08/09/2021 0455   RBC 4.84 08/09/2021 0455   HGB 12.6 08/10/2021 1113   HCT 37.0 08/10/2021 1113   PLT 95 (L) 08/09/2021 0455   MCV 81.4 08/09/2021 0455   MCH 20.7 (L) 08/09/2021 0455   MCHC 25.4 (L) 08/09/2021 0455   RDW 21.8 (H) 08/09/2021 0455   LYMPHSABS 0.8 08/09/2021 0455   MONOABS 0.4 08/09/2021 0455   EOSABS 0.0 08/09/2021 0455   BASOSABS 0.0 08/09/2021 0455    CMP  Component Value Date/Time   NA 140 04/01/2022 1635   K 3.3 (L) 04/01/2022 1635   CL 101 04/01/2022 1635   CO2 31 04/01/2022 1635   GLUCOSE 104 (H) 04/01/2022 1635   BUN 11 04/01/2022 1635   CREATININE 1.38 (H) 04/01/2022 1635   CREATININE 1.17 (H) 02/07/2015 1858   CALCIUM 9.2 04/01/2022 1635   PROT 6.5 04/01/2022 1635   ALBUMIN 3.2 (L) 04/01/2022 1635   AST 16 04/01/2022 1635   ALT 18 04/01/2022 1635   ALKPHOS 101 04/01/2022 1635   BILITOT 0.6 04/01/2022 1635   GFRNONAA 46 (L) 04/01/2022 1635   GFRAA >60 02/09/2015 0835   Component Ref Range & Units 1 d ago 3 wk ago 4 mo ago 5 mo ago 8 mo ago 7 yr ago  B Natriuretic Peptide 0.0 - 100.0 pg/mL 230.2 High   353.6 High  CM  175.4 High  CM  332.8 High  CM  550.9 High  CM  276.3 High     Component Ref Range & Units 7 mo ago  A-1 Antitrypsin Pheno  MM   Comment: (NOTE)        Phenotype  Population     A-1-AT Concentration*                   Incidence %   % of MM (Typical Range)        MM            86.5%       100%     (96 - 189)        MS             8.0%        86%     (83 - 161)        MZ             3.9%        61%     (60 - 111)        FM             0.4%       100%     (93 - 191)        SZ             0.3%        41%     (42 -  75)        SS             0.1%        64%     (62 - 119)        ZZ             0.05%       19%     (16 -  38)        FS             0.05%       70%     (70 - 128)        FZ            Unknown      46%     (44 -  88)        FF             Unknown                Unknown  *A-1-AT concentration in the homozygous MM phenotype  is taken as the reference normal. Percent deficiency  in each phenotype is reported relative to this  reference. Ranges used to confirm phenotype.  Performed At: [facility]  [place], [place] [id number]  Rush Farmer MD EP:[id number]   A-1 Antitrypsin, Ser 101 - 187 mg/dL 240 High     Ascites/Pleural fluid Feb 2023 Component Ref Range & Units 7 mo ago  Albumin, Fluid g/dL <1.5    Component Ref Range & Units 7 mo ago  Total protein, fluid g/dL <3.0   Comment: (NOTE)  No normal range established for this test  Results should be evaluated in conjunction with serum values   Fluid Type-FTP  PLEU RIGHT VC   Comment: Performed at [facility] [address], [place], [place] [address]  CORRECTED ON 02/16 AT 1052: PREVIOUSLY REPORTED AS Lung, Right    Component Ref Range & Units 7 mo ago  Fluid Type-FCT  PLEU RIGHT VC   Comment: CORRECTED ON 02/16 AT 1052: PREVIOUSLY REPORTED AS Lung, Right  Color, Fluid YELLOW YELLOW Abnormal    Appearance, Fluid CLEAR HAZY Abnormal    Total Nucleated Cell Count, Fluid 0 - 1,000 cu mm 2,347 High    Neutrophil Count, Fluid 0 - 25 % 55 High    Lymphs, Fluid % 39   Monocyte-Macrophage-Serous Fluid 50 - 90 % 4 Low    Eos, Fluid % 1   Other Cells, Fluid %  1 BASOPHIL    Component Ref Range & Units 7 mo ago (08/07/21) 7 mo ago (08/06/21)  Ammonia 9 - 35 umol/L 40 High   51 High     Component Ref Range & Units 7 mo ago  LKM1 Ab 0.0 - 20.0 Units 1.3   Comment: (NOTE)                                 Negative    0.0 - 20.0                                 Equivocal  20.1 - 24.9                                 Positive         >24.9  LKM type 1 antibodies are detected in patients with  autoimmune hepatitis type 2 and in up to 8% of  patients with chronic HCV infection.  Performed At: [facility]  [place], [place] [id number]  Rush Farmer MD FT:[id number]    Component Ref Range & Units 7 mo ago  F-Actin IgG 0 - 19 Units 5   Comment: (NOTE)                  Negative                     0 - 19                  Weak positive               20 - 30                  Moderate to strong positive     >30  Actin Antibodies are found  in 52-85% of patients with  autoimmune hepatitis or chronic active hepatitis and  in 22% of patients with primary biliary cirrhosis.  Performed At: [facility]  [place], [place] [id number]  Rush Farmer MD HK:[id number]    Component 7 mo ago  ANA Ab, IFA Negative   Comment: (NOTE)                                      Negative   <1:80                                      Borderline  1:80                                      Positive   >1:80  ICAP nomenclature: AC-0  For more information about Hep-2 cell patterns use  ANApatterns.org, the official website for the International  Consensus on Antinuclear Antibody (ANA) Patterns (ICAP).  Performed At: [facility]  [place], [place] [id number]  Rush Farmer MD OA:[id number]    Component Ref Range & Units 7 mo ago  Hepatitis B Surface Ag NON REACTIVE NON REACTIVE   HCV Ab NON REACTIVE NON REACTIVE   Comment: (NOTE)  Nonreactive HCV antibody screen is consistent with no HCV infections,  unless recent infection is suspected or other evidence exists to  indicate HCV infection.    Hep A IgM NON REACTIVE NON REACTIVE   Hep B C IgM NON REACTIVE NON REACTIVE   Comment: Performed at [facility], [place] [address], [place], [place] [address]   Component Ref Range & Units 7 mo ago 7 yr ago  Prothrombin Time 11.4 - 15.2 seconds SPECIMEN CLOTTED VC  14.4 R   Comment: NOTIFIED ALANA,RN BY CLARKS AT 0708  CORRECTED ON 02/13 AT 0709: PREVIOUSLY REPORTED AS 15.9   INR 0.8 - 1.2 SPECIMEN CLOTTED VC  1.10 R   Comment: NOTIFIEDC ALANA,RN BY CLARKS AT 0708  (NOTE)   INR goal varies based on device and disease states.  Performed at [facility], [place] [address], [place], [place]  [address]     ASSESSMENT AND PLAN: 51 year old female with COPD, severe pulmonary hypertension/right heart failure with incidentally noted cirrhosis during admission in February of this year for volume overload/acute heart failure.  The etiology of her cirrhosis is not clear.  Viral and autoimmune hepatitis serologies negative.  Alpha-1 antitrypsin deficiency ruled out.  She does not drink alcohol.  She is overweight with a history of hypertension, but does not have diabetes.  Overall, NASH seems the most likely diagnosis. Given her relatively young age and absence of a clear etiology of her chronic liver disease, I do think a liver biopsy is reasonable, particularly if there is any thought that she may need a liver transplant.  Although diagnostic liver biopsy yields or lower in the setting of cirrhosis, I am hopeful that her cirrhosis is not so advanced that we can obtain some adequate hepatic tissue. It is also unclear if her pulmonary hypertension may be secondary to Portopulmonary hypertension, although I would expect this patient with more advanced cirrhosis.  Nevertheless, she is on the appropriate management through her cardiologist. She is  due for [facility] screening.  We will get right upper quadrant ultrasound baseline AFP. Repeat MELD labs.  We will screen for hemochromatosis with iron panel to rule out as possible cause of cirrhosis. We discussed the indication for an EGD and for her initial laboratory screening colonoscopy.  Neither 1 of these issues are urgent, and the patient is very nervous about being sedated for these procedures.  I think we will start with a liver biopsy and discuss endoscopic procedures at follow-up. We will obtain zinc level given her complaints of dry itchy skin and diarrhea. Recommend patient try taking cholestyramine to help with her diarrhea given  her cholecystectomy status.   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

## 2022-04-02 NOTE — Patient Instructions (Signed)
Your provider has requested that you go to the basement level for lab work before leaving today. Press "B" on the elevator. The lab is located at the first door on the left as you exit the elevator.   You will be contacted by [facility] (Your caller ID will indicate phone # [phone]) within the next business 7-10 business days to schedule your Abdominal ultrasound and liver biopsy. If you have not heard from them within 7-10 business days, please call [facility] at [phone] to follow up on the status of your appointment.    _______________________________________________________  If you are age 102 or older, your body mass index should be between 23-30. Your Body mass index is 33.29 kg/m. If this is out of the aforementioned range listed, please consider follow up with your Primary Care Provider.  If you are age 68 or younger, your body mass index should be between 19-25. Your Body mass index is 33.29 kg/m. If this is out of the aformentioned range listed, please consider follow up with your Primary Care Provider.   ________________________________________________________  The [facility] providers would like to encourage you to use [facility] to communicate with providers for non-urgent requests or questions.  Due to long hold times on the telephone, sending your provider a message by [facility] may be a faster and more efficient way to get a response.  Please allow 48 business hours for a response.  Please remember that this is for non-urgent requests.  _______________________________________________________

## 2022-04-02 NOTE — Telephone Encounter (Signed)
-----  Message from Joette Catching, [place] sent at 04/01/2022  5:25 PM EDT ----- Scr slightly worse and K low. Would decrease furosemide back to 40 mg daily (taking 40 mg q am and 20 mg q pm) and increase spiro to 25 mg daily. BMET 1 week

## 2022-04-02 NOTE — Telephone Encounter (Signed)
Advanced Heart Failure Patient Advocate Encounter  Referral sent via fax along with PA approval. Document scanned to chart.

## 2022-04-03 LAB — IBC + FERRITIN
Ferritin: 19.1 ng/mL (ref 10.0–291.0)
Iron: 26 ug/dL — ABNORMAL LOW (ref 42–145)
Saturation Ratios: 7.1 % — ABNORMAL LOW (ref 20.0–50.0)
TIBC: 365.4 ug/dL (ref 250.0–450.0)
Transferrin: 261 mg/dL (ref 212.0–360.0)

## 2022-04-03 LAB — VITAMIN D 25 HYDROXY (VIT D DEFICIENCY, FRACTURES): VITD: 10.95 ng/mL — ABNORMAL LOW (ref 30.00–100.00)

## 2022-04-04 LAB — ZINC: Zinc: 53 ug/dL — ABNORMAL LOW (ref 60–130)

## 2022-04-04 LAB — AFP TUMOR MARKER: AFP-Tumor Marker: 2.1 ng/mL

## 2022-04-09 ENCOUNTER — Ambulatory Visit (HOSPITAL_COMMUNITY)
Admission: RE | Admit: 2022-04-09 | Discharge: 2022-04-09 | Disposition: A | Discharge status: Home / Self Care | Payer: 59 | Source: Ambulatory Visit | Attending: Cardiology | Admitting: Cardiology

## 2022-04-09 ENCOUNTER — Telehealth (HOSPITAL_COMMUNITY): Payer: Self-pay

## 2022-04-09 DIAGNOSIS — I5032 Chronic diastolic (congestive) heart failure: Secondary | ICD-10-CM

## 2022-04-09 DIAGNOSIS — K7460 Unspecified cirrhosis of liver: Secondary | ICD-10-CM | POA: Diagnosis not present

## 2022-04-09 DIAGNOSIS — R188 Other ascites: Secondary | ICD-10-CM | POA: Diagnosis not present

## 2022-04-09 LAB — BASIC METABOLIC PANEL
Anion gap: 10 (ref 5–15)
BUN: 14 mg/dL (ref 6–20)
CO2: 27 mmol/L (ref 22–32)
Calcium: 9.3 mg/dL (ref 8.9–10.3)
Chloride: 105 mmol/L (ref 98–111)
Creatinine, Ser: 0.85 mg/dL (ref 0.44–1.00)
GFR, Estimated: >60 mL/min (ref >60)
Glucose, Bld: 100 mg/dL — ABNORMAL HIGH (ref 70–99)
Potassium: 3.4 mmol/L — ABNORMAL LOW (ref 3.5–5.1)
Sodium: 142 mmol/L (ref 135–145)

## 2022-04-09 NOTE — Telephone Encounter (Signed)
Social Security forms faxed on 04/09/22 at 11.30 am. Patient informed

## 2022-04-10 ENCOUNTER — Ambulatory Visit (HOSPITAL_COMMUNITY)
Admission: RE | Admit: 2022-04-10 | Discharge: 2022-04-10 | Disposition: A | Discharge status: Home / Self Care | Payer: 59 | Source: Ambulatory Visit | Attending: Gastroenterology | Admitting: Gastroenterology

## 2022-04-10 ENCOUNTER — Other Ambulatory Visit: Payer: Self-pay

## 2022-04-10 DIAGNOSIS — I5032 Chronic diastolic (congestive) heart failure: Secondary | ICD-10-CM | POA: Diagnosis not present

## 2022-04-10 DIAGNOSIS — R161 Splenomegaly, not elsewhere classified: Secondary | ICD-10-CM | POA: Diagnosis not present

## 2022-04-10 DIAGNOSIS — R188 Other ascites: Secondary | ICD-10-CM

## 2022-04-10 DIAGNOSIS — K7460 Unspecified cirrhosis of liver: Secondary | ICD-10-CM | POA: Diagnosis not present

## 2022-04-10 DIAGNOSIS — J90 Pleural effusion, not elsewhere classified: Secondary | ICD-10-CM | POA: Diagnosis not present

## 2022-04-10 MED ORDER — VITAMIN D (ERGOCALCIFEROL) 1.25 MG (50000 UNIT) PO CAPS: 50000.0000 [IU] | ORAL_CAPSULE | ORAL | 0 refills | Status: DC

## 2022-04-10 MED ORDER — FERROUS SULFATE 325 (65 FE) MG PO TABS: 325.0000 mg | ORAL_TABLET | Freq: Every day | ORAL | 0 refills | Status: DC

## 2022-04-10 NOTE — Progress Notes (Signed)
Ms. Tegtmeyer,  Your blood test show that you have low levels of several vitamins and minerals, namely zinc, iron and vitamin D.  These deficiencies are more common in patients with liver disease/cirrhosis.  I recommend you start taking ergocalciferol 50,000 international units weekly for 16 weeks.  We will recheck a vitamin D level at that time and adjust dosing as needed.   I would also recommend you start taking a daily iron supplement (ferrous sulfate 325 mg) once a day for the next 4 months. Zinc sulfate 50 mg is an over-the-counter zinc supplement that I would recommend you take also for the next 4 months.  I would take this and get the iron with food as this can cause upset stomach.  Vaughan Basta, Will you please place prescription for the ergocalciferol 50,000 IU by mouth once a week #16 refill 0 and ferrous sulfate 325 mg by mouth once daily #120 refill 0?

## 2022-04-12 ENCOUNTER — Telehealth (HOSPITAL_COMMUNITY): Payer: Self-pay

## 2022-04-12 DIAGNOSIS — I5032 Chronic diastolic (congestive) heart failure: Secondary | ICD-10-CM

## 2022-04-12 NOTE — Telephone Encounter (Addendum)
Pt aware, agreeable, and verbalized understanding  Labs rescheduled. Stated was still taking the increased dose of lasix. Informed her to take on ly 1 tablet a day of lasix and add another potassium tablet   ----- Message from Joette Catching, PA-C sent at 04/10/2022 10:21 AM EDT ----- K still low. Did she cut back furosemide and increase spiro to 25 daily as recommended 10/10? If so, need to add potassium 20 mEq daily. Recheck BMET in about 10 days.

## 2022-04-15 ENCOUNTER — Telehealth (HOSPITAL_COMMUNITY): Payer: Self-pay

## 2022-04-15 NOTE — Telephone Encounter (Signed)
Spoke to [place] this morning to discuss home visit appointment today. She reports she is unable to meet as she has a broken tooth and has to be seen by dentist today. She has disability appointment Wednesday and we planned a paramedicine visit for next week in the clinic. She agreed. No needs at this time. Call complete.   Pamela Ho, [place] 04/15/2022

## 2022-04-17 ENCOUNTER — Ambulatory Visit (HOSPITAL_COMMUNITY): Payer: 59

## 2022-04-18 ENCOUNTER — Other Ambulatory Visit: Payer: Self-pay | Admitting: Student

## 2022-04-18 ENCOUNTER — Other Ambulatory Visit: Payer: Self-pay | Admitting: Radiology

## 2022-04-18 DIAGNOSIS — K7469 Other cirrhosis of liver: Secondary | ICD-10-CM

## 2022-04-19 ENCOUNTER — Ambulatory Visit (HOSPITAL_COMMUNITY)
Admission: RE | Admit: 2022-04-19 | Discharge: 2022-04-19 | Disposition: A | Discharge status: Home / Self Care | Payer: 59 | Source: Ambulatory Visit | Attending: Gastroenterology | Admitting: Gastroenterology

## 2022-04-19 ENCOUNTER — Other Ambulatory Visit: Payer: Self-pay

## 2022-04-19 ENCOUNTER — Encounter (HOSPITAL_COMMUNITY): Payer: Self-pay

## 2022-04-19 DIAGNOSIS — K7460 Unspecified cirrhosis of liver: Secondary | ICD-10-CM | POA: Diagnosis not present

## 2022-04-19 DIAGNOSIS — R188 Other ascites: Secondary | ICD-10-CM | POA: Diagnosis not present

## 2022-04-19 DIAGNOSIS — K7400 Hepatic fibrosis, unspecified: Secondary | ICD-10-CM | POA: Diagnosis not present

## 2022-04-19 DIAGNOSIS — K7469 Other cirrhosis of liver: Secondary | ICD-10-CM | POA: Diagnosis not present

## 2022-04-19 DIAGNOSIS — K759 Inflammatory liver disease, unspecified: Secondary | ICD-10-CM | POA: Diagnosis not present

## 2022-04-19 DIAGNOSIS — K7401 Hepatic fibrosis, early fibrosis: Secondary | ICD-10-CM | POA: Diagnosis not present

## 2022-04-19 LAB — CBC
HCT: 39.7 % (ref 36.0–46.0)
Hemoglobin: 11 g/dL — ABNORMAL LOW (ref 12.0–15.0)
MCH: 21.4 pg — ABNORMAL LOW (ref 26.0–34.0)
MCHC: 27.7 g/dL — ABNORMAL LOW (ref 30.0–36.0)
MCV: 77.1 fL — ABNORMAL LOW (ref 80.0–100.0)
Platelets: 116 10*3/uL — ABNORMAL LOW (ref 150–400)
RBC: 5.15 MIL/uL — ABNORMAL HIGH (ref 3.87–5.11)
RDW: 20 % — ABNORMAL HIGH (ref 11.5–15.5)
WBC: 3.9 10*3/uL — ABNORMAL LOW (ref 4.0–10.5)
nRBC: 0 % (ref 0.0–0.2)

## 2022-04-19 LAB — PROTIME-INR
INR: 1.1 (ref 0.8–1.2)
Prothrombin Time: 14 seconds (ref 11.4–15.2)

## 2022-04-19 MED ORDER — GELATIN ABSORBABLE 12-7 MM EX MISC
CUTANEOUS | Status: AC
  Filled 2022-04-19: qty 1

## 2022-04-19 MED ORDER — HYDROCODONE-ACETAMINOPHEN 5-325 MG PO TABS: 1.0000 | ORAL_TABLET | ORAL | Status: DC | PRN

## 2022-04-19 MED ORDER — LIDOCAINE HCL (PF) 1 % IJ SOLN: 10.0000 mL | Freq: Once | INTRAMUSCULAR | Status: DC

## 2022-04-19 MED ORDER — LIDOCAINE HCL (PF) 1 % IJ SOLN
INTRAMUSCULAR | Status: AC
  Administered 2022-04-19: 10 mL via SUBCUTANEOUS
  Filled 2022-04-19: qty 30

## 2022-04-19 MED ORDER — SODIUM CHLORIDE 0.9 % IV SOLN: INTRAVENOUS | Status: DC

## 2022-04-19 NOTE — H&P (Signed)
Chief Complaint: Further evaluation of cirrhosis. Request is liver biopsy  Referring Physician(s): [place]  Supervising Physician: Corrie Mckusick  Patient Status: [facility] - Out-pt  History of Present Illness: Pamela Ho is a 51 y.o. female  outpatient. Smoker. History of HTN, anemia, COPD, CHF, cirrhosis of unknown etiology. Team is requesting a liver biopsy for further evaluation.    Currently without any significant complaints. Patient alert and laying in bed,calm. Denies any fevers, headache, chest pain, SOB, cough, abdominal pain, nausea, vomiting or bleeding. Return precautions and treatment recommendations and follow-up discussed with the patient who is agreeable with the plan.   Past Medical History:  Diagnosis Date   Acute congestive heart failure ([place]) 08/05/2021   Anemia    Anxiety    COPD (chronic obstructive pulmonary disease) (HCC)    Depression    GERD (gastroesophageal reflux disease)    Heart murmur    Hypertension     Past Surgical History:  Procedure Laterality Date   CHOLECYSTECTOMY     cyst from neck     IR THORACENTESIS ASP PLEURAL SPACE W/IMG GUIDE  08/09/2021   RIGHT HEART CATH N/A 08/10/2021   Procedure: RIGHT HEART CATH;  Surgeon: Larey Dresser, MD;  Location: [place] [facility];  Service: Cardiovascular;  Laterality: N/A;    Allergies: Tyvaso [treprostinil], Lexapro [escitalopram], and Lisinopril  Medications: Prior to Admission medications   Medication Sig Start Date End Date Taking? Authorizing Provider  acetaminophen (TYLENOL) 500 MG tablet Take 500-1,000 mg by mouth every 6 (six) hours as needed for mild pain or headache.   Yes [provider]  albuterol (VENTOLIN HFA) 108 (90 Base) MCG/ACT inhaler Inhale 2 puffs into the lungs every 6 (six) hours as needed for wheezing or shortness of breath. 08/12/21  Yes Debbe Odea, MD  Carboxymethylcellul-Glycerin (LUBRICATING EYE DROPS OP) Place 1 drop into both eyes  daily as needed (irritation). Blink-N-Clear   Yes [provider]  clonazePAM (KLONOPIN) 0.5 MG tablet Take 0.5 mg by mouth 2 (two) times daily.   Yes [provider]  ferrous sulfate 325 (65 FE) MG tablet Take 1 tablet (325 mg total) by mouth daily with breakfast. 04/10/22  Yes Daryel November, MD  furosemide (LASIX) 40 MG tablet Take 1 tablet (40 mg total) by mouth daily. 04/02/22 04/02/23 Yes Joette Catching, PA-C  hydrocortisone cream 1 % Apply 1 Application topically 2 (two) times daily as needed for itching.   Yes [provider]  loperamide (IMODIUM A-D) 2 MG tablet Take 2 mg by mouth as needed for diarrhea or loose stools.   Yes [provider]  macitentan (OPSUMIT) 10 MG tablet Take 10 mg by mouth daily. 12/05/21  Yes Larey Dresser, MD  metoprolol succinate (TOPROL-XL) 50 MG 24 hr tablet Take 1 tablet (50 mg total) by mouth daily. 02/27/22  Yes Larey Dresser, MD  omeprazole (PRILOSEC) 20 MG capsule Take 20 mg by mouth daily.   Yes [provider]  potassium chloride SA (KLOR-CON M) 20 MEQ tablet Take 1 tablet (20 mEq total) by mouth daily. 09/28/21  Yes [place], Maricela Bo, FNP  pravastatin (PRAVACHOL) 20 MG tablet Take 20 mg by mouth daily.   Yes [provider]  sertraline (ZOLOFT) 100 MG tablet Take 150 mg by mouth daily.   Yes [provider]  spironolactone (ALDACTONE) 25 MG tablet Take 1 tablet (25 mg total) by mouth daily. 04/02/22  Yes Joette Catching, PA-C  Vitamin  D, Ergocalciferol, (DRISDOL) 1.25 MG (50000 UNIT) CAPS capsule Take 1 capsule (50,000 Units total) by mouth every 7 (seven) days. 04/10/22  Yes Daryel November, MD  Selexipag (UPTRAVI) 200 & 800 MCG TBPK Take 200 mcg by mouth 2 (two) times daily.    [provider]  umeclidinium-vilanterol (ANORO ELLIPTA) 62.5-25 MCG/ACT AEPB Inhale 1 puff into the lungs daily as needed (shortness of breath).    [provider]  Zinc 50 MG  TABS Take 50 mg by mouth daily.    [provider]     Family History  Problem Relation Age of Onset   Diabetes Mother    Heart disease Mother    Hyperlipidemia Mother    Diabetes Father    Cancer Father    Heart disease Father    Hyperlipidemia Father    Hypertension Father    Heart disease Maternal Grandmother    Hyperlipidemia Maternal Grandmother    Hypertension Maternal Grandmother    Heart disease Maternal Grandfather    Hyperlipidemia Maternal Grandfather    Hypertension Maternal Grandfather    Heart disease Paternal Grandmother    Hyperlipidemia Paternal Grandmother    Hypertension Paternal Grandmother    Stroke Paternal Grandmother    Diabetes Paternal Grandmother    Cancer Paternal Grandmother    Breast cancer Neg Hx     Social History   Socioeconomic History   Marital status: Married    Spouse name: Yajayra Feldt   Number of children: 1   Years of education: Not on file   Highest education level: Some college, no degree  Occupational History   Occupation: unemployed   Occupation: disabled  Tobacco Use   Smoking status: Former    Packs/day: 1.50    Years: 30.00    Total pack years: 45.00    Types: Cigarettes    Quit date: 08/05/2021    Years since quitting: 0.7   Smokeless tobacco: Never  Vaping Use   Vaping Use: Never used  Substance and Sexual Activity   Alcohol use: No    Alcohol/week: 0.0 standard drinks of alcohol   Drug use: No   Sexual activity: Not on file  Other Topics Concern   Not on file  Social History Narrative   Not on file   Social Determinants of Health   Financial Resource Strain: High Risk (08/07/2021)   Overall Financial Resource Strain (CARDIA)    Difficulty of Paying Living Expenses: Hard  Food Insecurity: No Food Insecurity (08/07/2021)   Hunger Vital Sign    Worried About Running Out of Food in the Last Year: Never true    Ran Out of Food in the Last Year: Never true  Transportation Needs: No Transportation  Needs (08/07/2021)   PRAPARE - Hydrologist (Medical): No    Lack of Transportation (Non-Medical): No  Physical Activity: Not on file  Stress: Not on file  Social Connections: Not on file   Review of Systems: A 12 point ROS discussed and pertinent positives are indicated in the HPI above.  All other systems are negative.  Review of Systems  Vital Signs: BP (!) 142/73   Pulse 70   Temp (!) 97.2 F (36.2 C) (Temporal)   Resp 17   Ht _0  (1.575 m)   Wt 178 lb (80.7 kg)   LMP 06/24/2017 (Approximate)   SpO2 98%   BMI 32.56 kg/m     Physical Exam Vitals reviewed.  Constitutional:  Appearance: Normal appearance.  HENT:     Head: Normocephalic and atraumatic.  Eyes:     Extraocular Movements: Extraocular movements intact.  Cardiovascular:     Rate and Rhythm: Normal rate and regular rhythm.  Pulmonary:     Effort: Pulmonary effort is normal. No respiratory distress.     Breath sounds: Normal breath sounds.  Abdominal:     Palpations: Abdomen is soft.  Musculoskeletal:        General: Normal range of motion.     Cervical back: Normal range of motion.  Skin:    General: Skin is warm and dry.  Neurological:     General: No focal deficit present.     Mental Status: She is alert and oriented to person, place, and time.  Psychiatric:        Mood and Affect: Mood normal.        Behavior: Behavior normal.        Thought Content: Thought content normal.        Judgment: Judgment normal.     Imaging: US Abdomen Limited RUQ (LIVER/GB)  Result Date: 04/11/2022 CLINICAL DATA:  Cirrhosis.  History of ascites. EXAM: ULTRASOUND ABDOMEN LIMITED RIGHT UPPER QUADRANT COMPARISON:  August 06, 2021 FINDINGS: Gallbladder: Surgically absent Common bile duct: Diameter: 3.5 mm Liver: Nodular contour with increased echogenicity. No liver mass identified. Portal vein is patent on color Doppler imaging with normal direction of blood flow towards the liver.  Other: Small right pleural effusion is identified. Splenomegaly is noted. The spleen measures 19.2 x 8.1 x 18.9 cm with a volume of 1530 cc. IMPRESSION: 1. The liver has a cirrhotic morphology. No liver mass identified. 2. Splenomegaly with a volume of 1530 cc. 3. Small right pleural effusion. Electronically Signed   By: Dorise Bullion III M.D.   On: 04/11/2022 10:54   CT CHEST W CONTRAST  Result Date: 03/29/2022 CLINICAL DATA:  Pulmonary MAC. EXAM: CT CHEST WITH CONTRAST TECHNIQUE: Multidetector CT imaging of the chest was performed during intravenous contrast administration. RADIATION DOSE REDUCTION: This exam was performed according to the departmental dose-optimization program which includes automated exposure control, adjustment of the mA and/or kV according to patient size and/or use of iterative reconstruction technique. CONTRAST:  40m OMNIPAQUE IOHEXOL 350 MG/ML SOLN COMPARISON:  Chest CT 08/08/2021. FINDINGS: Cardiovascular: No significant vascular findings. Normal heart size. No pericardial effusion. There are atherosclerotic calcifications of the coronary arteries. Mediastinum/Nodes: No enlarged mediastinal, hilar, or axillary lymph nodes. Thyroid gland, trachea, and esophagus demonstrate no significant findings. Lungs/Pleura: Mild emphysematous changes are present. There are minimal atelectatic changes in the right middle lobe. There is a small right pleural effusion which has significantly decreased. No new focal lung infiltrate. Trachea and central airways are patent. Upper Abdomen: Marked splenomegaly again noted. Questionable nodular liver contour. Musculoskeletal: No chest wall abnormality. No acute or significant osseous findings. IMPRESSION: 1. Small right pleural effusion has significantly decreased in size. 2. Minimal atelectatic changes in the right middle lobe. 3. Marked splenomegaly. 4. Questionable nodular liver contour. Correlate clinically for cirrhosis. Electronically Signed   By:  ARonney AstersM.D.   On: 03/29/2022 21:06    Labs:  CBC: Recent Labs    08/08/21 0500 08/09/21 0455 08/10/21 1110 08/10/21 1111 08/10/21 1113 04/02/22 1103 04/19/22 1213  WBC 5.8 5.9  --   --   --  3.3* 3.9*  HGB 10.7* 10.0*   < > 12.9 12.6 10.1* 11.0*  HCT 40.6 39.4   < >  38.0 37.0 32.4* 39.7  PLT 90* 95*  --   --   --  106.0* 116*   < > = values in this interval not displayed.    COAGS: Recent Labs    08/06/21 0415 04/02/22 1103  INR SPECIMEN CLOTTED 1.0  APTT SPECIMEN CLOTTED  --     BMP: Recent Labs    03/11/22 1410 03/26/22 1355 04/01/22 1635 04/09/22 1519  NA 144 139 140 142  K 4.2 3.5 3.3* 3.4*  CL 103 104 101 105  CO2 _0 GLUCOSE 104* 165* 104* 100*  BUN 12 22* 11 14  CALCIUM 9.6 9.5 9.2 9.3  CREATININE 0.86 1.04* 1.38* 0.85  GFRNONAA >60 >60 46* >60    LIVER FUNCTION TESTS: Recent Labs    08/05/21 2054 08/06/21 0415 04/01/22 1635  BILITOT 3.0* 2.2* 0.6  AST _1 ALT 33 29 18  ALKPHOS 86 77 101  PROT 6.2* 5.8* 6.5  ALBUMIN 2.7* 2.5* 3.2*    TUMOR MARKERS: Recent Labs    04/02/22 1103  AFPTM 2.1    Assessment and Plan:  51 y.o. female outpatient. Smoker. History of HTN, anemia, COPD, CHF, cirrhosis of unknown etiology. Team is requesting a liver biopsy for further evaluation.    [place] abd limited from 10.19.23 reads The liver has a cirrhotic morphology. No liver mass identified. All labs and medications are within acceptable parameters. No pertinent allergies.   Patient does not wish to have sedation today.  Risks and benefits of liver was discussed with the patient and/or patient's family including, but not limited to bleeding, infection, damage to adjacent structures or low yield requiring additional tests.  All of the questions were answered and there is agreement to proceed.  Consent signed and in chart.  Electronically Signed: Murrell Redden, PA-C 04/19/2022, 12:50 PM   I spent a total of    25 Minutes in  face to face in clinical consultation, greater than 50% of which was counseling/coordinating care for random liver biopsy.

## 2022-04-19 NOTE — Procedures (Signed)
  Procedure:  [place] core biopsy liver R lobe Preprocedure diagnosis: Diagnoses of Cirrhosis of liver with ascites, unspecified hepatic cirrhosis type ([place]) and Other cirrhosis of liver ([place]) were pertinent to this visit.  Postprocedure diagnosis: same EBL:    minimal Complications:   none immediate  See full dictation in [facility].  Dillard Cannon MD Main # [phone] Pager  [phone] Mobile [phone]

## 2022-04-19 NOTE — Progress Notes (Signed)
Patient was given discharge instructions. Both verbalized understanding.

## 2022-04-19 NOTE — Progress Notes (Signed)
Tolerated liver biopsy well. Band aid to right upper abd. Dry and intact. Report given to [facility].

## 2022-04-22 ENCOUNTER — Other Ambulatory Visit (HOSPITAL_COMMUNITY): Payer: 59

## 2022-04-25 ENCOUNTER — Other Ambulatory Visit (HOSPITAL_COMMUNITY): Payer: 59

## 2022-04-25 LAB — SURGICAL PATHOLOGY

## 2022-04-26 ENCOUNTER — Telehealth (HOSPITAL_COMMUNITY): Payer: Self-pay

## 2022-04-26 NOTE — Telephone Encounter (Signed)
Clearance faxed for dental procedure to [facility].

## 2022-05-01 ENCOUNTER — Telehealth (HOSPITAL_COMMUNITY): Payer: Self-pay

## 2022-05-01 ENCOUNTER — Telehealth (HOSPITAL_COMMUNITY): Payer: Self-pay | Admitting: Licensed Clinical Social Worker

## 2022-05-01 NOTE — Telephone Encounter (Signed)
HF Paramedicine Team Based Care Meeting  HF MD- NA  HF NP - [place] NP-C   [facility] admit within the last 30 days for heart failure? no  Medications concerns? Pt seems to have a good handle on medications  Eligible for discharge? Hopeful for DC soon but pt has had barriers to home visits so have been unable to see since recent med changes.  Jorge Ny, LCSW Clinical Social Worker Advanced [facility] Failure [facility] Desk#: [phone] Cell#: [phone]

## 2022-05-01 NOTE — Telephone Encounter (Signed)
Talked to [facility] in regards to paramedicine home visit in which she agreed to for 11/8 however she let me know on 11/8 she is running fever with laryngitis. She asked to reschedule for 11/15. I will follow up then.  [name], [place] 05/01/2022

## 2022-05-02 IMAGING — NM NM PULMONARY VENT & PERF
8 series · 8 of 8 positions shown · non-contrast
Comparison: None.

CLINICAL DATA: PE suspected

EXAM:
NUCLEAR MEDICINE PERFUSION LUNG SCAN
TECHNIQUE: Perfusion images were obtained in multiple projections after
intravenous injection of radiopharmaceutical.
Ventilation scans intentionally deferred if perfusion scan and chest
x-ray adequate for interpretation during COVID 19 epidemic.
RADIOPHARMACEUTICALS:  4.0 mCi [id number]-KKm MAA IV

[Series 1: ant/post perf · 4.14mm/px · 1 of 1 slices shown (1 of 2)]
[im 1/1]
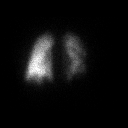

[Series 1: ant/post perf · 4.14mm/px · 1 of 1 slices shown (2 of 2)]
[im 1/1]
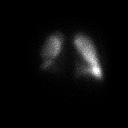

[Series 2: lao/rpo perf · 4.14mm/px · 1 of 1 slices shown (1 of 2)]
[im 1/1]
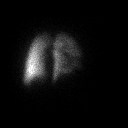

[Series 2: lao/rpo perf · 4.14mm/px · 1 of 1 slices shown (2 of 2)]
[im 1/1]
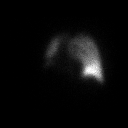

[Series 3: lpo/rao perf · 4.14mm/px · 1 of 1 slices shown (1 of 2)]
[im 1/1]
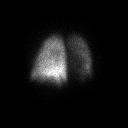

[Series 3: lpo/rao perf · 4.14mm/px · 1 of 1 slices shown (2 of 2)]
[im 1/1]
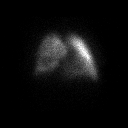

[Series 4: lt lat/rt lat perf · 4.14mm/px · 1 of 1 slices shown (1 of 2)]
[im 1/1]
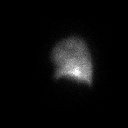

[Series 4: lt lat/rt lat perf · 4.14mm/px · 1 of 1 slices shown (2 of 2)]
[im 1/1]
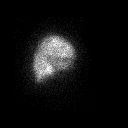

[8 of 8 positions shown; findings below may reference images not displayed]

FINDINGS: Matched perfusion defect at the right lung base, in keeping with
pleural effusion seen by prior radiographs. Otherwise homogeneous
perfusion of the lungs without suspicious filling defect.
Cardiomegaly.
IMPRESSION: 1. Matched perfusion defect at the right lung base, in keeping with
pleural effusion seen by prior radiographs. No suspicious perfusion
defect. Very low probability examination for pulmonary embolism by
modified perfusion only PIOPED criteria (PE absent).

2.  Cardiomegaly.

## 2022-05-03 ENCOUNTER — Ambulatory Visit (HOSPITAL_COMMUNITY)
Admission: RE | Admit: 2022-05-03 | Discharge: 2022-05-03 | Disposition: A | Discharge status: Home / Self Care | Payer: 59 | Source: Ambulatory Visit | Attending: Cardiology | Admitting: Cardiology

## 2022-05-03 DIAGNOSIS — I5032 Chronic diastolic (congestive) heart failure: Secondary | ICD-10-CM | POA: Diagnosis not present

## 2022-05-03 LAB — BASIC METABOLIC PANEL
Anion gap: 6 (ref 5–15)
BUN: 10 mg/dL (ref 6–20)
CO2: 34 mmol/L — ABNORMAL HIGH (ref 22–32)
Calcium: 9.4 mg/dL (ref 8.9–10.3)
Chloride: 100 mmol/L (ref 98–111)
Creatinine, Ser: 0.76 mg/dL (ref 0.44–1.00)
GFR, Estimated: >60 mL/min (ref >60)
Glucose, Bld: 95 mg/dL (ref 70–99)
Potassium: 3.6 mmol/L (ref 3.5–5.1)
Sodium: 140 mmol/L (ref 135–145)

## 2022-05-07 ENCOUNTER — Other Ambulatory Visit (HOSPITAL_COMMUNITY): Payer: Self-pay

## 2022-05-08 ENCOUNTER — Telehealth (HOSPITAL_COMMUNITY): Payer: Self-pay

## 2022-05-08 ENCOUNTER — Telehealth (HOSPITAL_COMMUNITY): Payer: Self-pay | Admitting: Licensed Clinical Social Worker

## 2022-05-08 NOTE — Telephone Encounter (Signed)
CSW referred to assist patient with obtaining a BP cuff. CSW contacted patient to inform cuff will be delivered to home and will arrive on Saturday. Patient grateful for support and assistance. CSW available as needed. Raquel Sarna, [place], [place]

## 2022-05-08 NOTE — Telephone Encounter (Signed)
Spoke to [employer] PA at HF clinic who confirmed clearance for graduation from paramedicine. I called [name] and confirmed graduation from Paramedicine which she was anticipating. She agreed with his as she feels she is independently managing her care and medications without assistance. She now has insurance and access to food and transportation. She denied any further needs at this time and is grateful for the program and understands to reach out to appropriate providers if a need arises. Call complete. Graduation from paramedicine successful.    Patient is now discharged from [facility].  Patient has/has not met the following goals:  Yes :Patient expresses basic understanding of medications and what they are for Yes :Patient able to verbalize heart failure specific dietary/fluid restrictions Yes :Patient is aware of who to call if they have medical concerns or if they need to schedule or change appts Yes :Patient has a scale for daily weights and weighs regularly Yes :Patient able to verbalize concerning symptoms when they should call the HF clinic (weight gain ranges, etc) Yes :Patient has a PCP and has seen within the past year or has upcoming appt Yes :Patient has reliable access to getting their medications Yes :Patient has shown they are able to reorder medications reliably No :Patient has had admission in past 30 days- if yes how many? No :Patient has had admission in past 90 days- if yes how many?        Pamela Ho, [place] 05/08/2022

## 2022-05-09 ENCOUNTER — Encounter (HOSPITAL_BASED_OUTPATIENT_CLINIC_OR_DEPARTMENT_OTHER): Payer: 59 | Admitting: Cardiology

## 2022-05-15 DIAGNOSIS — I509 Heart failure, unspecified: Secondary | ICD-10-CM | POA: Diagnosis not present

## 2022-05-15 DIAGNOSIS — Z6832 Body mass index (BMI) 32.0-32.9, adult: Secondary | ICD-10-CM | POA: Diagnosis not present

## 2022-05-15 DIAGNOSIS — Z8249 Family history of ischemic heart disease and other diseases of the circulatory system: Secondary | ICD-10-CM | POA: Diagnosis not present

## 2022-05-15 DIAGNOSIS — R69 Illness, unspecified: Secondary | ICD-10-CM | POA: Diagnosis not present

## 2022-05-15 DIAGNOSIS — Z809 Family history of malignant neoplasm, unspecified: Secondary | ICD-10-CM | POA: Diagnosis not present

## 2022-05-15 DIAGNOSIS — I272 Pulmonary hypertension, unspecified: Secondary | ICD-10-CM | POA: Diagnosis not present

## 2022-05-15 DIAGNOSIS — E785 Hyperlipidemia, unspecified: Secondary | ICD-10-CM | POA: Diagnosis not present

## 2022-05-15 DIAGNOSIS — E669 Obesity, unspecified: Secondary | ICD-10-CM | POA: Diagnosis not present

## 2022-05-15 DIAGNOSIS — K219 Gastro-esophageal reflux disease without esophagitis: Secondary | ICD-10-CM | POA: Diagnosis not present

## 2022-05-15 DIAGNOSIS — Z888 Allergy status to other drugs, medicaments and biological substances status: Secondary | ICD-10-CM | POA: Diagnosis not present

## 2022-05-15 DIAGNOSIS — I110 Hypertensive heart disease with heart failure: Secondary | ICD-10-CM | POA: Diagnosis not present

## 2022-05-28 ENCOUNTER — Other Ambulatory Visit (HOSPITAL_COMMUNITY): Payer: Self-pay | Admitting: Family Medicine

## 2022-05-31 ENCOUNTER — Telehealth (HOSPITAL_COMMUNITY): Payer: Self-pay | Admitting: Pharmacy Technician

## 2022-05-31 NOTE — Telephone Encounter (Signed)
Advanced Heart Failure Patient Advocate Encounter  Called and spoke with patient. Confirmed she's taking Uptravi with no issues.  Charlann Boxer, CPhT

## 2022-05-31 NOTE — Telephone Encounter (Signed)
Advanced Heart Failure Patient Advocate Encounter  Received notification from Lasandra Beech (RN, BSN with [facility]), patient is tolerating 400 mc bid. Plan is to start 600 mcg bid on 06/04/22.  Charlann Boxer, CPhT

## 2022-06-05 ENCOUNTER — Ambulatory Visit (INDEPENDENT_AMBULATORY_CARE_PROVIDER_SITE_OTHER): Payer: 59 | Admitting: Internal Medicine

## 2022-06-05 ENCOUNTER — Other Ambulatory Visit: Payer: Self-pay

## 2022-06-05 ENCOUNTER — Encounter: Payer: Self-pay | Admitting: Internal Medicine

## 2022-06-05 VITALS — BP 118/75 | HR 57 | Temp 98.4°F | Ht 63.0 in | Wt 182.0 lb

## 2022-06-05 DIAGNOSIS — A310 Pulmonary mycobacterial infection: Secondary | ICD-10-CM | POA: Diagnosis not present

## 2022-06-05 NOTE — Progress Notes (Unsigned)
Patient Active Problem List   Diagnosis Date Noted   Rheumatism 10/09/2021   Heart failure, type unknown ([place]) 08/30/2021   Thrombocytopenia ([place]) 08/10/2021   Adrenal mass ([place]) 08/09/2021   Pleural effusion on right 08/09/2021   Acute respiratory failure with hypoxia ([place]) 08/07/2021   AKI (acute kidney injury) ([place]) 08/07/2021   Glucose intolerance 08/07/2021   Cirrhosis of liver ([place]) 08/06/2021   Generalized anxiety disorder 08/06/2021   GERD without esophagitis 08/06/2021   Hypokalemia 08/06/2021   Chronic diastolic heart failure ([place]) 08/06/2021   Elevated troponin level not due myocardial infarction 08/05/2021   Nicotine dependence, cigarettes, uncomplicated 95/28/4132   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   Hypertensive urgency 02/08/2015   Headache 02/08/2015    Patient's Medications  New Prescriptions   No medications on file  Previous Medications   ACETAMINOPHEN (TYLENOL) 500 MG TABLET    Take 500-1,000 mg by mouth every 6 (six) hours as needed for mild pain or headache.   ALBUTEROL (VENTOLIN HFA) 108 (90 BASE) MCG/ACT INHALER    Inhale 2 puffs into the lungs every 6 (six) hours as needed for wheezing or shortness of breath.   CARBOXYMETHYLCELLUL-GLYCERIN (LUBRICATING EYE DROPS OP)    Place 1 drop into both eyes daily as needed (irritation). Blink-N-Clear   CLONAZEPAM (KLONOPIN) 0.5 MG TABLET    Take 0.5 mg by mouth 2 (two) times daily.   FERROUS SULFATE 325 (65 FE) MG TABLET    Take 1 tablet (325 mg total) by mouth daily with breakfast.   FUROSEMIDE (LASIX) 40 MG TABLET    Take 1 tablet (40 mg total) by mouth daily.   HYDROCORTISONE CREAM 1 %    Apply 1 Application topically 2 (two) times daily as needed for itching.   LOPERAMIDE (IMODIUM A-D) 2 MG TABLET    Take 2 mg by mouth as needed for diarrhea or loose stools.   MACITENTAN (OPSUMIT) 10 MG TABLET    Take 10 mg by mouth daily.   METOPROLOL SUCCINATE (TOPROL-XL) 50 MG 24 HR TABLET    Take 1 tablet  (50 mg total) by mouth daily.   OMEPRAZOLE (PRILOSEC) 20 MG CAPSULE    Take 20 mg by mouth daily.   POTASSIUM CHLORIDE SA (KLOR-CON M) 20 MEQ TABLET    Take 1 tablet by mouth once daily   PRAVASTATIN (PRAVACHOL) 20 MG TABLET    Take 20 mg by mouth daily.   SELEXIPAG (UPTRAVI) 200 & 800 MCG TBPK    Take 200 mcg by mouth 2 (two) times daily.   SERTRALINE (ZOLOFT) 100 MG TABLET    Take 150 mg by mouth daily.   SPIRONOLACTONE (ALDACTONE) 25 MG TABLET    Take 1 tablet (25 mg total) by mouth daily.   UMECLIDINIUM-VILANTEROL (ANORO ELLIPTA) 62.5-25 MCG/ACT AEPB    Inhale 1 puff into the lungs daily as needed (shortness of breath).   VITAMIN D, ERGOCALCIFEROL, (DRISDOL) 1.25 MG (50000 UNIT) CAPS CAPSULE    Take 1 capsule (50,000 Units total) by mouth every 7 (seven) days.   ZINC 50 MG TABS    Take 50 mg by mouth daily.  Modified Medications   No medications on file  Discontinued Medications   No medications on file    Subjective: Pamela Ho is a 51 y.o. F with PMHX as below admitted to [facility] 2/13-19 for acute respiratory failure felt to be multifactorial in the setting of pulmonary edema and underlying COPD.  CT showed  large right pleural effusion, with complete atelectasis of right middle and lower lobe. Non-specific right paratracheal LN enlarged, scattered infiltrate RUL. Pt underwent thoracentesis. 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 During hospitalization she was noted to have acute on chronic heart failure with 6.7L diuresis then transition to lasix.  09/11/21: PT presnts with husband. She is on 2L O2 She reports her breathing has improved significantly since discharge.  Pt was supposed to drop off AFB sputum Cx 03/18/22:She has not had a cough as such did not drop off sputum samples. Breathing is baseline.  Today 06/05/22: On 3L 02. Reports breathing is baseline. Review of Systems: Review of Systems  All other systems reviewed and are  negative.   Past Medical History:  Diagnosis Date   Acute congestive heart failure ([place]) 08/05/2021   Anemia    Anxiety    COPD (chronic obstructive pulmonary disease) (HCC)    Depression    GERD (gastroesophageal reflux disease)    Heart murmur    Hypertension     Social History   Tobacco Use   Smoking status: Former    Packs/day: 1.50    Years: 30.00    Total pack years: 45.00    Types: Cigarettes    Quit date: 08/05/2021    Years since quitting: 0.8   Smokeless tobacco: Never  Vaping Use   Vaping Use: Never used  Substance Use Topics   Alcohol use: No    Alcohol/week: 0.0 standard drinks of alcohol   Drug use: No    Family History  Problem Relation Age of Onset   Diabetes Mother    Heart disease Mother    Hyperlipidemia Mother    Diabetes Father    Cancer Father    Heart disease Father    Hyperlipidemia Father    Hypertension Father    Heart disease Maternal Grandmother    Hyperlipidemia Maternal Grandmother    Hypertension Maternal Grandmother    Heart disease Maternal Grandfather    Hyperlipidemia Maternal Grandfather    Hypertension Maternal Grandfather    Heart disease Paternal Grandmother    Hyperlipidemia Paternal Grandmother    Hypertension Paternal Grandmother    Stroke Paternal Grandmother    Diabetes Paternal Grandmother    Cancer Paternal Grandmother    Breast cancer Neg Hx     Allergies  Allergen Reactions   Tyvaso [Treprostinil] Cough    Headaches    Lexapro [Escitalopram] Other (See Comments)    "Made me crazy"    Lisinopril Other (See Comments)    "Angioedema"    Health Maintenance  Topic Date Due   DTaP/Tdap/Td (1 - Tdap) Never done   PAP SMEAR-Modifier  Never done   COLONOSCOPY (Pts 45-27yr Insurance coverage will need to be confirmed)  Never done   MAMMOGRAM  06/08/2019   Zoster Vaccines- Shingrix (1 of 2) Never done   INFLUENZA VACCINE  01/22/2022   COVID-19 Vaccine (3 - 2023-24 season) 02/22/2022   Lung Cancer  Screening  03/29/2023   Hepatitis C Screening  Completed   HIV Screening  Completed   HPV VACCINES  Aged Out    Objective:  Vitals:   06/05/22 1334  Weight: 182 lb (82.6 kg)  Height: _0  (1.6 m)   Body mass index is 32.24 kg/m.  Physical Exam Constitutional:      Appearance: Normal appearance.  HENT:     Head: Normocephalic and atraumatic.     Right Ear: Tympanic membrane normal.  Left Ear: Tympanic membrane normal.     Nose: Nose normal.     Mouth/Throat:     Mouth: Mucous membranes are moist.  Eyes:     Extraocular Movements: Extraocular movements intact.     Conjunctiva/sclera: Conjunctivae normal.     Pupils: Pupils are equal, round, and reactive to light.  Cardiovascular:     Rate and Rhythm: Normal rate and regular rhythm.     Heart sounds: No murmur heard.    No friction rub. No gallop.  Pulmonary:     Effort: Pulmonary effort is normal.     Breath sounds: Normal breath sounds.  Abdominal:     General: Abdomen is flat.     Palpations: Abdomen is soft.  Musculoskeletal:        General: Normal range of motion.  Skin:    General: Skin is warm and dry.  Neurological:     General: No focal deficit present.     Mental Status: She is alert and oriented to person, place, and time.  Psychiatric:        Mood and Affect: Mood normal.     Lab Results Lab Results  Component Value Date   WBC 3.9 (L) 04/19/2022   HGB 11.0 (L) 04/19/2022   HCT 39.7 04/19/2022   MCV 77.1 (L) 04/19/2022   PLT 116 (L) 04/19/2022    Lab Results  Component Value Date   CREATININE 0.76 05/03/2022   BUN 10 05/03/2022   NA 140 05/03/2022   K 3.6 05/03/2022   CL 100 05/03/2022   CO2 34 (H) 05/03/2022    Lab Results  Component Value Date   ALT 18 04/01/2022   AST 16 04/01/2022   ALKPHOS 101 04/01/2022   BILITOT 0.6 04/01/2022    No results found for: "CHOL", "HDL", "LDLCALC", "LDLDIRECT", "TRIG", "CHOLHDL" No results found for: "LABRPR", "RPRTITER" No results found  for: "HIV1RNAQUANT", "HIV1RNAVL", "CD4TABS"   Assessment and Plan: #Respiratory Cx + MAC -Pt's recent hospitalization for acute respiratory failure can be attributed to volume overload vs COPD exacerbation.  2/15 CT showed bilateral infiltrates in right upper lobe, right pleural effusion with atelectasis.  Nonspecific enlarged right paratracheal lymph node. 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 AFB smear negative with Cx+MAC Referred to ID(S rifampin, clarythromycin).) Referred to ID. -At last visit we discussed that given her imaging not showing extensive nodular disease or cavitary lesion and AFB smear negative(Thoracentesis), she can hold off on treatment -Repeat CT on 10/5 showed no nodular disease/cavities.  As such patient does not meet the radiological criteria for diagnosis of NTM.  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 Plan: - Given underlying lung disease we will continue to monitor clinically yearly. - Follow-up with ID in 1 year     I have personally spent 45 minutes involved in face-to-face and non-face-to-face activities for this patient on the day of the visit. Professional time spent includes the following activities: Preparing to see the patient (review of tests), Obtaining and/or reviewing separately obtained history (admission/discharge record), Performing a medically appropriate examination and/or evaluation , Ordering medications/tests/procedures, referring and communicating with other health care professionals, Documenting clinical information in the EMR, Independently interpreting results (not separately reported), Communicating results to the patient/family/caregiver, Counseling and educating the patient/family/caregiver and Care coordination (not separately reported).     Laurice Record, MD [facility] 06/05/2022, 1:38 PM

## 2022-06-11 ENCOUNTER — Encounter (HOSPITAL_COMMUNITY): Payer: Self-pay | Admitting: Cardiology

## 2022-06-11 ENCOUNTER — Ambulatory Visit (HOSPITAL_COMMUNITY)
Admission: RE | Admit: 2022-06-11 | Discharge: 2022-06-11 | Disposition: A | Discharge status: Home / Self Care | Payer: 59 | Source: Ambulatory Visit | Attending: Cardiology | Admitting: Cardiology

## 2022-06-11 VITALS — BP 130/78 | HR 65 | Wt 183.4 lb

## 2022-06-11 DIAGNOSIS — J90 Pleural effusion, not elsewhere classified: Secondary | ICD-10-CM | POA: Diagnosis not present

## 2022-06-11 DIAGNOSIS — I110 Hypertensive heart disease with heart failure: Secondary | ICD-10-CM | POA: Insufficient documentation

## 2022-06-11 DIAGNOSIS — Z9981 Dependence on supplemental oxygen: Secondary | ICD-10-CM | POA: Insufficient documentation

## 2022-06-11 DIAGNOSIS — Z79899 Other long term (current) drug therapy: Secondary | ICD-10-CM | POA: Insufficient documentation

## 2022-06-11 DIAGNOSIS — Z9889 Other specified postprocedural states: Secondary | ICD-10-CM | POA: Insufficient documentation

## 2022-06-11 DIAGNOSIS — R161 Splenomegaly, not elsewhere classified: Secondary | ICD-10-CM | POA: Insufficient documentation

## 2022-06-11 DIAGNOSIS — K7460 Unspecified cirrhosis of liver: Secondary | ICD-10-CM | POA: Diagnosis not present

## 2022-06-11 DIAGNOSIS — Z87891 Personal history of nicotine dependence: Secondary | ICD-10-CM | POA: Diagnosis not present

## 2022-06-11 DIAGNOSIS — R9431 Abnormal electrocardiogram [ECG] [EKG]: Secondary | ICD-10-CM | POA: Insufficient documentation

## 2022-06-11 DIAGNOSIS — J9611 Chronic respiratory failure with hypoxia: Secondary | ICD-10-CM | POA: Insufficient documentation

## 2022-06-11 DIAGNOSIS — I272 Pulmonary hypertension, unspecified: Secondary | ICD-10-CM | POA: Insufficient documentation

## 2022-06-11 DIAGNOSIS — I5032 Chronic diastolic (congestive) heart failure: Secondary | ICD-10-CM | POA: Diagnosis not present

## 2022-06-11 LAB — BASIC METABOLIC PANEL
Anion gap: 7 (ref 5–15)
BUN: 20 mg/dL (ref 6–20)
CO2: 31 mmol/L (ref 22–32)
Calcium: 10 mg/dL (ref 8.9–10.3)
Chloride: 99 mmol/L (ref 98–111)
Creatinine, Ser: 0.9 mg/dL (ref 0.44–1.00)
GFR, Estimated: >60 mL/min (ref >60)
Glucose, Bld: 140 mg/dL — ABNORMAL HIGH (ref 70–99)
Potassium: 4.6 mmol/L (ref 3.5–5.1)
Sodium: 137 mmol/L (ref 135–145)

## 2022-06-11 LAB — BRAIN NATRIURETIC PEPTIDE: B Natriuretic Peptide: 300.4 pg/mL — ABNORMAL HIGH (ref 0.0–100.0)

## 2022-06-11 NOTE — Patient Instructions (Signed)
There has been no changes to your medications.  Labs done today, your results will be available in MyChart, we will contact you for abnormal readings.  Your physician has requested that you have an echocardiogram. Echocardiography is a painless test that uses sound waves to create images of your heart. It provides your doctor with information about the size and shape of your heart and how well your heart's chambers and valves are working. This procedure takes approximately one hour. There are no restrictions for this procedure. Please do NOT wear cologne, perfume, aftershave, or lotions (deodorant is allowed). Please arrive 15 minutes prior to your appointment time.  Your physician has recommended that you have a sleep study. This test records several body functions during sleep, including: brain activity, eye movement, oxygen and carbon dioxide blood levels, heart rate and rhythm, breathing rate and rhythm, the flow of air through your mouth and nose, snoring, body muscle movements, and chest and belly movement. YOU WILL BE CALLED TO HAVE THIS TEST ARRANGED.  Your physician recommends that you schedule a follow-up appointment in: 3 months with an echocardiogram  If you have any questions or concerns before your next appointment please send [place] a message through [place] or call our office at [phone].    TO LEAVE A MESSAGE FOR THE NURSE SELECT OPTION 2, PLEASE LEAVE A MESSAGE INCLUDING: YOUR NAME DATE OF BIRTH CALL BACK NUMBER REASON FOR CALL**this is important as we prioritize the call backs  YOU WILL RECEIVE A CALL BACK THE SAME DAY AS LONG AS YOU CALL BEFORE 4:00 PM  At the [facility], you and your health needs are our priority. As part of our continuing mission to provide you with exceptional heart care, we have created designated Provider Care Teams. These Care Teams include your primary Cardiologist (physician) and Advanced Practice Providers (APPs- Physician  Assistants and Nurse Practitioners) who all work together to provide you with the care you need, when you need it.   You may see any of the following providers on your designated Care Team at your next follow up: Dr Glori Bickers Dr Loralie Champagne Dr. Roxana Hires, NP Lyda Jester, [facility] [place], [place] Forestine Na, NP Audry Riles, PharmD   Please be sure to bring in all your medications bottles to every appointment.

## 2022-06-12 NOTE — Progress Notes (Signed)
PCP: Dr Albertina Sen[place] HF Cardiology: Dr Aundra Dubin   HPI: Pamela Ho is a 51 y.o. with history of HTN, COPD/smoking 1.5-2 ppd until about 2/23, GERD, and cirrhosis noted since 2016 by imaging though patient unaware until 2/23.    Admitted 08/05/21 with A/C HFpEF. Diuresed with IV lasix. Echo showed EF 60-65%, moderately dilated and moderately dysfunctional RV, D-shaped septum, PASP 59 mmHg, and severe RAE. Abdominal US showed cirrhosis with minimal ascites and splenomegaly was noted by CT.  CT chest showed splenomegaly with large right effusion, right paratracheal node enlarged, right-sided atelectasis, and scattered infiltration RUL.  She had a right thoracentesis with 1.3 L off, this fluid grew MAC.  HCV/HBV/HAV serologies negative.  Had [place] with  severe pulmonary hypertension, high cardiac output, and PVR 3.7. Discharge weight 181 pounds. She was sent home on oxygen.    Developed intractable cough on Tyvaso and stopped it.  She started on Uptravi.   She had a liver biopsy that showed nonspecific findings.   She returns for followup of pulmonary hypertension/RV failure.  She continues to wear 3 L home oxygen.  Has not had sleep study yet.  She is up to 400 mcg bid selexipag.  She has had a mild headache so far.  She does ok walking on flat ground at a slow/steady pace.  She is short of breath walking up stairs.  No chest pain.  No lightheadedness, no palpitations.  Weight up 3 lbs.   ECG (personally reviewed): NSR, poor RWP  6 minute walk (4/23): 274 m 6 minute walk (5/23): 366 m 6 minute walk (9/23): 289 m (Off Tyvaso)  Labs (2/23): anti-SCL 70 negative, RF > 650, anti-centromere Ab negative, HIV negative Labs (3/23): K 3.7, creatinine 0.89 Labs (4/23): BNP 333 Labs (5/23): K 4.4, creatinine 1.13 Labs (10/23) K 3.5, BUN 22, Cr 1.04 Labs (11/23): K 3.6, creatinine 0.76  PMH: 1. COPD: smoked 1.5-2 ppd x years, stopped 2/23.  CT chest with emphysema.  2. 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 PVCs 4. H/o cholecystectomy 5. Cirrhosis: Suspect NAFLD.  Never a heavy drinker.  Viral hepatitis workup negative.  - Splenomegaly - Liver biopsy unremarkable.  6. Psoriasis 7. Pulmonary HTN/RV failure: Echo (2/23) with EF 60-65%, D-shaped septum, moderate RV enlargement with moderately decreased RV systolic function, PASP 59 mmHg.   - V/Q scan (2/23): No evidence for chronic PE.  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   ROS: All systems negative except as listed in HPI, PMH and Problem List.  SH:  Social History   Socioeconomic History   Marital status: Married    Spouse name: Pamela Ho   Number of children: 1   Years of education: Not on file   Highest education level: Some college, no degree  Occupational History   Occupation: unemployed   Occupation: disabled  Tobacco Use   Smoking status: Former    Packs/day: 1.50    Years: 30.00    Total pack years: 45.00    Types: Cigarettes    Quit date: 08/05/2021    Years since quitting: 0.8   Smokeless tobacco: Never  Vaping Use   Vaping Use: Never used  Substance and Sexual Activity   Alcohol use: No    Alcohol/week: 0.0 standard drinks of alcohol   Drug use: No  Sexual activity: Not on file  Other Topics Concern   Not on file  Social History Narrative   Not on file   Social Determinants of Health   Financial Resource Strain: High Risk (08/07/2021)   Overall Financial Resource Strain (CARDIA)    Difficulty of Paying Living Expenses: Hard  Food Insecurity: No Food Insecurity (08/07/2021)   Hunger Vital Sign    Worried About Running Out of Food in the Last Year: Never true    Ran Out of Food in the Last Year: Never true  Transportation Needs: No Transportation Needs (08/07/2021)   PRAPARE - Hydrologist (Medical):  No    Lack of Transportation (Non-Medical): No  Physical Activity: Not on file  Stress: Not on file  Social Connections: Not on file  Intimate Partner Violence: Not on file   FH:  Family History  Problem Relation Age of Onset   Diabetes Mother    Heart disease Mother    Hyperlipidemia Mother    Diabetes Father    Cancer Father    Heart disease Father    Hyperlipidemia Father    Hypertension Father    Heart disease Maternal Grandmother    Hyperlipidemia Maternal Grandmother    Hypertension Maternal Grandmother    Heart disease Maternal Grandfather    Hyperlipidemia Maternal Grandfather    Hypertension Maternal Grandfather    Heart disease Paternal Grandmother    Hyperlipidemia Paternal Grandmother    Hypertension Paternal Grandmother    Stroke Paternal Grandmother    Diabetes Paternal Grandmother    Cancer Paternal Grandmother    Breast cancer Neg Hx    Current Outpatient Medications  Medication Sig Dispense Refill   acetaminophen (TYLENOL) 500 MG tablet Take 500-1,000 mg by mouth every 6 (six) hours as needed for mild pain or headache.     albuterol (VENTOLIN HFA) 108 (90 Base) MCG/ACT inhaler Inhale 2 puffs into the lungs every 6 (six) hours as needed for wheezing or shortness of breath. 8 g 2   Carboxymethylcellul-Glycerin (LUBRICATING EYE DROPS OP) Place 1 drop into both eyes daily as needed (irritation). Blink-N-Clear     clonazePAM (KLONOPIN) 0.5 MG tablet Take 0.5 mg by mouth 2 (two) times daily.     ferrous sulfate 325 (65 FE) MG tablet Take 1 tablet (325 mg total) by mouth daily with breakfast. 120 tablet 0   furosemide (LASIX) 40 MG tablet Take 1 tablet (40 mg total) by mouth daily. 30 tablet 11   hydrocortisone cream 1 % Apply 1 Application topically 2 (two) times daily as needed for itching.     loperamide (IMODIUM A-D) 2 MG tablet Take 2 mg by mouth as needed for diarrhea or loose stools.     macitentan (OPSUMIT) 10 MG tablet Take 10 mg by mouth daily.      metoprolol succinate (TOPROL-XL) 50 MG 24 hr tablet Take 1 tablet (50 mg total) by mouth daily. 30 tablet 6   omeprazole (PRILOSEC) 20 MG capsule Take 20 mg by mouth daily.     potassium chloride SA (KLOR-CON M) 20 MEQ tablet Take 1 tablet by mouth once daily 30 tablet 0   pravastatin (PRAVACHOL) 20 MG tablet Take 20 mg by mouth daily.     Selexipag (UPTRAVI) 200 & 800 MCG TBPK Take 200 mcg by mouth 2 (two) times daily.     sertraline (ZOLOFT) 100 MG tablet Take 150 mg by mouth daily.     spironolactone (ALDACTONE) 25 MG tablet  Take 1 tablet (25 mg total) by mouth daily. 30 tablet 5   Vitamin D, Ergocalciferol, (DRISDOL) 1.25 MG (50000 UNIT) CAPS capsule Take 1 capsule (50,000 Units total) by mouth every 7 (seven) days. 16 capsule 0   Zinc 50 MG TABS Take 50 mg by mouth daily.     umeclidinium-vilanterol (ANORO ELLIPTA) 62.5-25 MCG/ACT AEPB Inhale 1 puff into the lungs daily as needed (shortness of breath). (Patient not taking: Reported on 06/05/2022)     No current facility-administered medications for this encounter.   BP 130/78   Pulse 65   Wt 83.2 kg (183 lb 6.4 oz)   SpO2 100%   BMI 32.49 kg/m   Wt Readings from Last 3 Encounters:  06/11/22 83.2 kg (183 lb 6.4 oz)  06/05/22 82.6 kg (182 lb)  04/19/22 80.7 kg (178 lb)    PHYSICAL EXAM: General: NAD Neck: Thick. No JVD, no thyromegaly or thyroid nodule.  Lungs: Distant BS CV: Nondisplaced PMI.  Heart regular S1/S2, no S3/S4, no murmur.  No peripheral edema.  No carotid bruit.  Normal pedal pulses.  Abdomen: Soft, nontender, no hepatosplenomegaly, no distention.  Skin: Intact without lesions or rashes.  Neurologic: Alert and oriented x 3.  Psych: Normal affect. Extremities: No clubbing or cyanosis.  HEENT: Normal.   ASSESSMENT & PLAN: 1. Pulmonary hypertension/Chronic diastolic CHF with RV failure:  Echo in 2/23 with EF 60-65%, moderately dilated and moderately dysfunctional RV, D-shaped septum, PASP 59 mmHg, and severe RAE  (no prior).  RHC after diuresis showed borderline elevated PCWP with severe pulmonary hypertension and elevated RA pressure.  Cause of pulmonary hypertension/RV failure is uncertain.  PVR was elevated but not markedly so at 3.7 WU due to high cardiac output that may be related to cirrhosis.  V/Q scan not suggestive of chronic PE.  Serologic workup was negative except for significantly elevated RF (normal CCP antibody).  It is certainly possible that the patient could have portopulmonary hypertension (form of group 1 PH) in setting of cirrhosis. She was a heavy smoker with COPD, suspect component of group 3 PH. However, CT chest did not seem to have extensive lung parenchymal disease => she was seen by pulmonary, thought to have emphysema on chest CT. PFTs showed moderate-severe obstruction/moderate restriction.  On exam today, she is not volume overloaded.  She is on 3 L [place].  NYHA class II-III, stable. She did not tolerate Tyvaso due to cough.  - Continue Uptravi, increase gradually.  She has had a mild headache with it but HA is tolerable.  - Continue Opsumit 10 mg daily.  - Continue Lasix 40 mg daily, BMET/BNP today. . - Continue spironolactone 25 mg daily.  - Will need to reschedule sleep study.  - Continue home oxygen. Goal O2 sat > 90%, per pulm. - Anti-CCP Ab was negative. With marked RF elevation, would like her to see rheumatology, she cancelled her new patient appointment. She has been referred back to Rheumatology - She will need echo and 6 minute walk at next appt in 3 months.  2. Cirrhosis: Cause is uncertain.  Noted by imaging back in 2016.  She has evidence for both cirrhosis and splenomegaly on imaging.  Tbili elevated, albumin low.  Viral hepatitis serologies were negative.  She denies ETOH use. ?NAFLD with progression to cirrhosis, also could be related to RV failure (though there was concern cirrhosis actually came first and led to portopulmonary hypertension). Interestingly, liver biopsy  with relatively unremarkable.  - Continue followup with  GI.  3. 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  Fluid grew MAC, followed by ID. Not currently requiring treatment. 4. Chronic  hypoxemic respiratory failure: In setting of COPD as well as RV failure/pulmonary hypertension. She remains on 3L oxygen. - Followup with pulmonary.   Loralie Champagne 06/12/22

## 2022-06-13 ENCOUNTER — Telehealth (HOSPITAL_COMMUNITY): Payer: Self-pay | Admitting: Pharmacy Technician

## 2022-06-13 NOTE — Telephone Encounter (Signed)
Advanced Heart Failure Patient Advocate Encounter  Received notification from Orlan Leavens (RN) with [facility]. Patient is currently tolerating 600 mcg bid. Plan is to titrate weekly as tolerated.   Charlann Boxer, CPhT

## 2022-06-19 DIAGNOSIS — D696 Thrombocytopenia, unspecified: Secondary | ICD-10-CM | POA: Diagnosis not present

## 2022-06-19 DIAGNOSIS — J90 Pleural effusion, not elsewhere classified: Secondary | ICD-10-CM | POA: Diagnosis not present

## 2022-06-19 DIAGNOSIS — E278 Other specified disorders of adrenal gland: Secondary | ICD-10-CM | POA: Diagnosis not present

## 2022-06-19 DIAGNOSIS — J9601 Acute respiratory failure with hypoxia: Secondary | ICD-10-CM | POA: Diagnosis not present

## 2022-06-27 ENCOUNTER — Other Ambulatory Visit (HOSPITAL_COMMUNITY): Payer: Self-pay | Admitting: Family Medicine

## 2022-07-02 ENCOUNTER — Encounter (HOSPITAL_BASED_OUTPATIENT_CLINIC_OR_DEPARTMENT_OTHER): Payer: 59 | Admitting: Cardiovascular Disease

## 2022-07-04 ENCOUNTER — Telehealth (HOSPITAL_COMMUNITY): Payer: Self-pay | Admitting: Pharmacy Technician

## 2022-07-04 ENCOUNTER — Other Ambulatory Visit (HOSPITAL_COMMUNITY): Payer: Self-pay

## 2022-07-04 ENCOUNTER — Telehealth (HOSPITAL_COMMUNITY): Payer: Self-pay | Admitting: Pharmacist

## 2022-07-04 NOTE — Telephone Encounter (Signed)
Advanced Heart Failure Patient Advocate Encounter  Prior Authorization for Malvin Johns has been approved through [facility].    PA# [id number]; confirmation number: [id number] W Effective dates: 07/04/2022 - 07/04/2023  Audry Riles, PharmD, BCPS, BCCP, CPP Heart Failure Clinic Pharmacist [phone]

## 2022-07-04 NOTE — Telephone Encounter (Signed)
Advanced Heart Failure Patient Advocate Encounter  Received PA for Uptravi from [facility]. Patient previously had [employer] and now has traditional medicaid. Medicaid still thinks that the patient has primary insurance and will not allow billing as the primary.  Called and spoke with the patient. Advised her to call [place] and see if they can email or fax her a dis enrollment letter. The patient is aware that she will have to call DSS and speak with her Medicaid worker regarding the change. They will also require documentation from [place] stating that the patient is not longer insured. Hopefully they can email or fax the document, so she will not have to wait for it in the mail.   Patient will call back tomorrow after calling both [employer] and DSS.

## 2022-07-05 NOTE — Telephone Encounter (Signed)
Advanced Heart Failure Patient Advocate Encounter  Patient called back stating that she was able to get dis enrollment letter but hasn't heard back from her Medicaid worker. I advised her to go to DSS and turn the paper in, in person if she does not hear back from them next Monday. She will run out of Uptravi next Wednesday.  She will call back with update.

## 2022-07-12 ENCOUNTER — Other Ambulatory Visit (HOSPITAL_COMMUNITY): Payer: Self-pay

## 2022-07-15 NOTE — Telephone Encounter (Signed)
Advanced Heart Failure Patient Advocate Encounter  Called patient regarding insurance change. On our end, it still shows medicaid as a secondary. Patient confirmed she sent the dis-enrollment letter to her worker and it would take about 48 hours to get it updated. [facility] has been sending her a week of Uptravi at a time. She is not out of medication.   She is getting Opsumit thorugh [employer] until June. Will look at switching to Ambrisentan if she still has medicaid as primary insurance at that time.  Patient will call back if she has further issues.  Charlann Boxer, CPhT

## 2022-07-18 ENCOUNTER — Encounter (HOSPITAL_BASED_OUTPATIENT_CLINIC_OR_DEPARTMENT_OTHER): Payer: 59 | Admitting: Cardiovascular Disease

## 2022-07-25 ENCOUNTER — Other Ambulatory Visit (HOSPITAL_COMMUNITY): Payer: Self-pay

## 2022-08-01 ENCOUNTER — Ambulatory Visit (HOSPITAL_BASED_OUTPATIENT_CLINIC_OR_DEPARTMENT_OTHER): Payer: Medicaid Other | Admitting: Cardiovascular Disease

## 2022-08-02 ENCOUNTER — Other Ambulatory Visit (HOSPITAL_COMMUNITY): Payer: Self-pay | Admitting: Family Medicine

## 2022-08-02 ENCOUNTER — Other Ambulatory Visit (HOSPITAL_COMMUNITY): Payer: Self-pay

## 2022-08-18 ENCOUNTER — Other Ambulatory Visit: Payer: Self-pay | Admitting: Gastroenterology

## 2022-08-24 ENCOUNTER — Other Ambulatory Visit: Payer: Self-pay | Admitting: Gastroenterology

## 2022-08-27 ENCOUNTER — Telehealth (HOSPITAL_COMMUNITY): Payer: Self-pay

## 2022-08-27 NOTE — Telephone Encounter (Signed)
Oxygen order and last office note faxed on 08/27/22

## 2022-09-10 ENCOUNTER — Other Ambulatory Visit (HOSPITAL_COMMUNITY): Payer: Self-pay | Admitting: Family Medicine

## 2022-09-12 ENCOUNTER — Other Ambulatory Visit (HOSPITAL_COMMUNITY): Payer: Self-pay

## 2022-09-12 ENCOUNTER — Encounter (HOSPITAL_COMMUNITY): Payer: Self-pay | Admitting: Cardiology

## 2022-09-12 ENCOUNTER — Ambulatory Visit (HOSPITAL_COMMUNITY)
Admission: RE | Admit: 2022-09-12 | Discharge: 2022-09-12 | Disposition: A | Discharge status: Home / Self Care | Payer: Medicaid Other | Source: Ambulatory Visit | Attending: Family Medicine | Admitting: Family Medicine

## 2022-09-12 ENCOUNTER — Ambulatory Visit (HOSPITAL_BASED_OUTPATIENT_CLINIC_OR_DEPARTMENT_OTHER)
Admission: RE | Admit: 2022-09-12 | Discharge: 2022-09-12 | Disposition: A | Discharge status: Home / Self Care | Payer: Medicaid Other | Source: Ambulatory Visit | Attending: Cardiology | Admitting: Cardiology

## 2022-09-12 VITALS — BP 110/60 | HR 86 | Wt 193.2 lb

## 2022-09-12 DIAGNOSIS — J9611 Chronic respiratory failure with hypoxia: Secondary | ICD-10-CM | POA: Insufficient documentation

## 2022-09-12 DIAGNOSIS — I272 Pulmonary hypertension, unspecified: Secondary | ICD-10-CM

## 2022-09-12 DIAGNOSIS — Z5986 Financial insecurity: Secondary | ICD-10-CM | POA: Diagnosis not present

## 2022-09-12 DIAGNOSIS — J90 Pleural effusion, not elsewhere classified: Secondary | ICD-10-CM | POA: Insufficient documentation

## 2022-09-12 DIAGNOSIS — R161 Splenomegaly, not elsewhere classified: Secondary | ICD-10-CM | POA: Diagnosis not present

## 2022-09-12 DIAGNOSIS — K7460 Unspecified cirrhosis of liver: Secondary | ICD-10-CM | POA: Diagnosis not present

## 2022-09-12 DIAGNOSIS — Z79899 Other long term (current) drug therapy: Secondary | ICD-10-CM | POA: Diagnosis not present

## 2022-09-12 DIAGNOSIS — Z87891 Personal history of nicotine dependence: Secondary | ICD-10-CM | POA: Diagnosis not present

## 2022-09-12 DIAGNOSIS — I5032 Chronic diastolic (congestive) heart failure: Secondary | ICD-10-CM

## 2022-09-12 DIAGNOSIS — J449 Chronic obstructive pulmonary disease, unspecified: Secondary | ICD-10-CM | POA: Insufficient documentation

## 2022-09-12 DIAGNOSIS — Z9889 Other specified postprocedural states: Secondary | ICD-10-CM | POA: Diagnosis not present

## 2022-09-12 DIAGNOSIS — I509 Heart failure, unspecified: Secondary | ICD-10-CM | POA: Diagnosis present

## 2022-09-12 DIAGNOSIS — I110 Hypertensive heart disease with heart failure: Secondary | ICD-10-CM | POA: Diagnosis not present

## 2022-09-12 DIAGNOSIS — Z9981 Dependence on supplemental oxygen: Secondary | ICD-10-CM | POA: Diagnosis not present

## 2022-09-12 LAB — ECHOCARDIOGRAM COMPLETE
AR max vel: 2.38 cm2
AV Area VTI: 2.3 cm2
AV Area mean vel: 2.29 cm2
AV Mean grad: 9 mmHg
AV Peak grad: 18 mmHg
Ao pk vel: 2.12 m/s
Area-P 1/2: 3.12 cm2
S' Lateral: 2.8 cm

## 2022-09-12 LAB — COMPREHENSIVE METABOLIC PANEL
ALT: 16 U/L (ref 0–44)
AST: 19 U/L (ref 15–41)
Albumin: 3.5 g/dL (ref 3.5–5.0)
Alkaline Phosphatase: 101 U/L (ref 38–126)
Anion gap: 12 (ref 5–15)
BUN: 10 mg/dL (ref 6–20)
CO2: 26 mmol/L (ref 22–32)
Calcium: 9.5 mg/dL (ref 8.9–10.3)
Chloride: 101 mmol/L (ref 98–111)
Creatinine, Ser: 0.86 mg/dL (ref 0.44–1.00)
GFR, Estimated: >60 mL/min (ref >60)
Glucose, Bld: 100 mg/dL — ABNORMAL HIGH (ref 70–99)
Potassium: 3 mmol/L — ABNORMAL LOW (ref 3.5–5.1)
Sodium: 139 mmol/L (ref 135–145)
Total Bilirubin: 0.6 mg/dL (ref 0.3–1.2)
Total Protein: 6.8 g/dL (ref 6.5–8.1)

## 2022-09-12 LAB — BRAIN NATRIURETIC PEPTIDE: B Natriuretic Peptide: 181.7 pg/mL — ABNORMAL HIGH (ref 0.0–100.0)

## 2022-09-12 NOTE — Progress Notes (Signed)
6 Min Walk Test Completed  Pt ambulated 396.2 meters O2 Sat ranged 84%-97% on 3-5 L pulsed  oxygen HR ranged 63-102

## 2022-09-12 NOTE — Patient Instructions (Signed)
There has been no changes to your medications.  Labs done today, your results will be available in MyChart, we will contact you for abnormal readings.  PLEASE CALL TO GET YOUR SLEEP STUDY TEST ARRANGED.  You are scheduled for a Cardiac Catheterization on Friday, April 26 with Dr. Loralie Champagne.  1. Please arrive at the [facility] (Main Entrance A) at [facility]: [address] [place], [place] [address] at 8:30 AM (This time is two hours before your procedure to ensure your preparation). Free valet parking service is available.   Special note: Every effort is made to have your procedure done on time. Please understand that emergencies sometimes delay scheduled procedures.  2. Diet: Do not eat solid foods after midnight.  The patient may have clear liquids until 5am upon the day of the procedure.  3.  Medication instructions in preparation for your procedure:   Contrast Allergy: No  HOLD Lasix the morning of your procedure   On the morning of your procedure, take  any morning medicines NOT listed above.  You may use sips of water.  5. Plan for one night stay--bring personal belongings. 6. Bring a current list of your medications and current insurance cards. 7. You MUST have a responsible person to drive you home. 8. Someone MUST be with you the first 24 hours after you arrive home or your discharge will be delayed. 9. Please wear clothes that are easy to get on and off and wear slip-on shoes.  Your physician recommends that you schedule a follow-up appointment in: 3 months (June) ** please call the office in May to arrange your follow up appointment. **  If you have any questions or concerns before your next appointment please send [place] a message through [place] or call our office at [phone].    TO LEAVE A MESSAGE FOR THE NURSE SELECT OPTION 2, PLEASE LEAVE A MESSAGE INCLUDING: YOUR NAME DATE OF BIRTH CALL BACK NUMBER REASON FOR CALL**this is important as we prioritize  the call backs  YOU WILL RECEIVE A CALL BACK THE SAME DAY AS LONG AS YOU CALL BEFORE 4:00 PM  At the [facility], you and your health needs are our priority. As part of our continuing mission to provide you with exceptional heart care, we have created designated Provider Care Teams. These Care Teams include your primary Cardiologist (physician) and Advanced Practice Providers (APPs- Physician Assistants and Nurse Practitioners) who all work together to provide you with the care you need, when you need it.   You may see any of the following providers on your designated Care Team at your next follow up: Dr Glori Bickers Dr Loralie Champagne Dr. Roxana Hires, NP Lyda Jester, [place] [facility] [place], [place] Forestine Na, NP Audry Riles, PharmD   Please be sure to bring in all your medications bottles to every appointment.    Thank you for choosing [facility]

## 2022-09-12 NOTE — Progress Notes (Signed)
Echocardiogram 2D Echocardiogram has been performed.  Pamela Ho 09/12/2022, 1:56 PM

## 2022-09-13 ENCOUNTER — Telehealth (HOSPITAL_COMMUNITY): Payer: Self-pay

## 2022-09-13 ENCOUNTER — Other Ambulatory Visit (HOSPITAL_COMMUNITY): Payer: Self-pay

## 2022-09-13 DIAGNOSIS — I5032 Chronic diastolic (congestive) heart failure: Secondary | ICD-10-CM

## 2022-09-13 NOTE — Progress Notes (Signed)
PCP: Dr Albertina Senegal HF Cardiology: Dr Aundra Dubin   HPI: Pamela Ho is a 52 y.o. with history of HTN, COPD/smoking 1.5-2 ppd until about 2/23, GERD, and cirrhosis noted since 2016 by imaging though patient unaware until 2/23.    Admitted 08/05/21 with A/C HFpEF. Diuresed with IV lasix. Echo showed EF 60-65%, moderately dilated and moderately dysfunctional RV, D-shaped septum, PASP 59 mmHg, and severe RAE. Abdominal US showed cirrhosis with minimal ascites and splenomegaly was noted by CT.  CT chest showed splenomegaly with large right effusion, right paratracheal node enlarged, right-sided atelectasis, and scattered infiltration RUL.  She had a right thoracentesis with 1.3 L off, this fluid grew MAC.  HCV/HBV/HAV serologies negative.  Had Adrian with  severe pulmonary hypertension, high cardiac output, and PVR 3.7. Discharge weight 181 pounds. She was sent home on oxygen.    Developed intractable cough on Tyvaso and stopped it.  She started on Uptravi.   She had a liver biopsy that showed nonspecific findings.   Echo was done today and reviewed, EF 60-65%, moderate LVH, moderate diastolic dysfunction, mild RV enlargement with normal RV systolic function, unable to estimate PA systolic pressure, IVC normal.   She returns for followup of pulmonary hypertension/RV failure.  She continues to wear 3 L home oxygen.  Has not had sleep study yet (canceled).  She is up to 1400 mcg bid selexipag.  Breathing is doing well as long as she wears her oxygen.  No dyspnea walking on flat ground.  No chest pain.  No lightheadedness.  No palpitations. Weight is up but her appetite has been better.   ECG (personally reviewed): NSR, normal  6 minute walk (4/23): 274 m 6 minute walk (5/23): 366 m 6 minute walk (9/23): 289 m (Off Tyvaso) 6 minute walk (3/24): 396 m  Labs (2/23): anti-SCL 70 negative, RF > 650, anti-centromere Ab negative, HIV negative Labs (3/23): K 3.7, creatinine 0.89 Labs (4/23): BNP 333 Labs (5/23): K  4.4, creatinine 1.13 Labs (10/23) K 3.5, BUN 22, Cr 1.04 Labs (11/23): K 3.6, creatinine 0.76  PMH: 1. COPD: smoked 1.5-2 ppd x years, stopped 2/23.  CT chest with emphysema.  2. 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 PVCs 4. H/o cholecystectomy 5. Cirrhosis: Suspect NAFLD.  Never a heavy drinker.  Viral hepatitis workup negative.  - Splenomegaly - Liver biopsy unremarkable.  6. Psoriasis 7. Pulmonary HTN/RV failure: Echo (2/23) with EF 60-65%, D-shaped septum, moderate RV enlargement with moderately decreased RV systolic function, PASP 59 mmHg.   - V/Q scan (2/23): No evidence for chronic PE.  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  - Echo (3/24): EF 60-65%, moderate LVH, moderate diastolic dysfunction, mild RV enlargement with normal RV systolic function, unable to estimate PA systolic pressure, IVC normal.   ROS: All systems negative except as listed in HPI, PMH and Problem List.  SH:  Social History   Socioeconomic History   Marital status: Married    Spouse name: Pamela Ho   Number of children: 1   Years of education: Not on file   Highest education level: Some college, no degree  Occupational History   Occupation: unemployed   Occupation: disabled  Tobacco Use   Smoking status: Former    Packs/day: 1.50    Years: 30.00    Additional pack years:  0.00    Total pack years: 45.00    Types: Cigarettes    Quit date: 08/05/2021    Years since quitting: 1.1   Smokeless tobacco: Never  Vaping Use   Vaping Use: Never used  Substance and Sexual Activity   Alcohol use: No    Alcohol/week: 0.0 standard drinks of alcohol   Drug use: No   Sexual activity: Not on file  Other Topics Concern   Not on file  Social History Narrative   Not on file   Social Determinants of Health   Financial  Resource Strain: High Risk (08/07/2021)   Overall Financial Resource Strain (CARDIA)    Difficulty of Paying Living Expenses: Hard  Food Insecurity: No Food Insecurity (08/07/2021)   Hunger Vital Sign    Worried About Running Out of Food in the Last Year: Never true    Ran Out of Food in the Last Year: Never true  Transportation Needs: No Transportation Needs (08/07/2021)   PRAPARE - Hydrologist (Medical): No    Lack of Transportation (Non-Medical): No  Physical Activity: Not on file  Stress: Not on file  Social Connections: Not on file  Intimate Partner Violence: Not on file   FH:  Family History  Problem Relation Age of Onset   Diabetes Mother    Heart disease Mother    Hyperlipidemia Mother    Diabetes Father    Cancer Father    Heart disease Father    Hyperlipidemia Father    Hypertension Father    Heart disease Maternal Grandmother    Hyperlipidemia Maternal Grandmother    Hypertension Maternal Grandmother    Heart disease Maternal Grandfather    Hyperlipidemia Maternal Grandfather    Hypertension Maternal Grandfather    Heart disease Paternal Grandmother    Hyperlipidemia Paternal Grandmother    Hypertension Paternal Grandmother    Stroke Paternal Grandmother    Diabetes Paternal Grandmother    Cancer Paternal Grandmother    Breast cancer Neg Hx    Current Outpatient Medications  Medication Sig Dispense Refill   acetaminophen (TYLENOL) 500 MG tablet Take 500-1,000 mg by mouth every 6 (six) hours as needed for mild pain or headache.     albuterol (VENTOLIN HFA) 108 (90 Base) MCG/ACT inhaler Inhale 2 puffs into the lungs every 6 (six) hours as needed for wheezing or shortness of breath. 8 g 2   Carboxymethylcellul-Glycerin (LUBRICATING EYE DROPS OP) Place 1 drop into both eyes daily as needed (irritation). Blink-N-Clear     clonazePAM (KLONOPIN) 0.5 MG tablet Take 0.5 mg by mouth daily.     furosemide (LASIX) 40 MG tablet Take 1 tablet (40  mg total) by mouth daily. 30 tablet 11   hydrocortisone cream 1 % Apply 1 Application topically 2 (two) times daily as needed for itching.     loperamide (IMODIUM A-D) 2 MG tablet Take 2 mg by mouth as needed for diarrhea or loose stools.     macitentan (OPSUMIT) 10 MG tablet Take 10 mg by mouth daily.     metoprolol succinate (TOPROL-XL) 50 MG 24 hr tablet Take 1 tablet (50 mg total) by mouth daily. 30 tablet 6   omeprazole (PRILOSEC) 20 MG capsule Take 20 mg by mouth daily.     potassium chloride SA (KLOR-CON M) 20 MEQ tablet Take 1 tablet by mouth once daily 30 tablet 6   pravastatin (PRAVACHOL) 20 MG tablet Take 20 mg by mouth daily.  Selexipag (UPTRAVI PO) Take 1,400 mcg by mouth 2 (two) times daily.     sertraline (ZOLOFT) 100 MG tablet Take 150 mg by mouth daily.     spironolactone (ALDACTONE) 25 MG tablet Take 1 tablet (25 mg total) by mouth daily. 30 tablet 5   Vitamin D, Ergocalciferol, (DRISDOL) 1.25 MG (50000 UNIT) CAPS capsule Take 1 capsule (50,000 Units total) by mouth every 7 (seven) days. 16 capsule 0   Zinc 50 MG TABS Take 50 mg by mouth daily.     ferrous sulfate 325 (65 FE) MG tablet Take 1 tablet (325 mg total) by mouth daily with breakfast. (Patient not taking: Reported on 09/12/2022) 120 tablet 0   No current facility-administered medications for this encounter.   BP 110/60   Pulse 86   Wt 87.6 kg (193 lb 3.2 oz)   SpO2 96% Comment: 3l n/c  BMI 34.22 kg/m   Wt Readings from Last 3 Encounters:  09/12/22 87.6 kg (193 lb 3.2 oz)  06/11/22 83.2 kg (183 lb 6.4 oz)  06/05/22 82.6 kg (182 lb)    PHYSICAL EXAM: General: NAD Neck: Thick. No JVD, no thyromegaly or thyroid nodule.  Lungs: Clear to auscultation bilaterally with normal respiratory effort. CV: Nondisplaced PMI.  Heart regular S1/S2, no S3/S4, no murmur.  No peripheral edema.  No carotid bruit.  Normal pedal pulses.  Abdomen: Soft, nontender, no hepatosplenomegaly, no distention.  Skin: Intact without  lesions or rashes.  Neurologic: Alert and oriented x 3.  Psych: Normal affect. Extremities: No clubbing or cyanosis.  HEENT: Normal.   ASSESSMENT & PLAN: 1. Pulmonary hypertension/Chronic diastolic CHF with RV failure:  Echo in 2/23 with EF 60-65%, moderately dilated and moderately dysfunctional RV, D-shaped septum, PASP 59 mmHg, and severe RAE (no prior).  RHC after diuresis showed borderline elevated PCWP with severe pulmonary hypertension and elevated RA pressure.  Cause of pulmonary hypertension/RV failure is uncertain.  PVR was elevated but not markedly so at 3.7 WU due to high cardiac output that may be related to cirrhosis.  V/Q scan not suggestive of chronic PE.  Serologic workup was negative except for significantly elevated RF (normal CCP antibody).  It is certainly possible that the patient could have portopulmonary hypertension (form of group 1 PH) in setting of cirrhosis. She was a heavy smoker with COPD, suspect component of group 3 PH. However, CT chest did not seem to have extensive lung parenchymal disease => she was seen by pulmonary, thought to have emphysema on chest CT. PFTs showed moderate-severe obstruction/moderate restriction.  Echo done today showed only mild RV enlargement with normal RV function, unable to estimate PASP. She is not volume overloaded on exam, NYHA class II.  She is on 3 L [place] chronically. She did not tolerate Tyvaso due to cough. 6 minute walk improved.  - Continue Uptravi, increase gradually.  She is up to 1400 mcg bid, will increase to 1600 mcg bid soon.   - Continue Opsumit 10 mg daily.  - Continue Lasix 40 mg daily, BMET/BNP today. . - Continue spironolactone 25 mg daily.  - Will need to reschedule sleep study.  - Continue home oxygen. Goal O2 sat > 90%, per pulm. - Anti-CCP Ab was negative. With marked RF elevation, would like her to see rheumatology, she cancelled her new patient appointment. She has been referred back to Rheumatology and has appointment  again.  - Unable to estimate PASP on echo, I will arrange for repeat RHC.  If PA pressure remains  high, would aim to add tadalafil.  We discussed risks/benefits and she agrees to procedure.  2. Cirrhosis: Cause is uncertain.  Noted by imaging back in 2016.  She has evidence for both cirrhosis and splenomegaly on imaging.  Tbili elevated, albumin low.  Viral hepatitis serologies were negative.  She denies ETOH use. ?NAFLD with progression to cirrhosis, also could be related to RV failure (though there was concern cirrhosis actually came first and led to portopulmonary hypertension). Interestingly, liver biopsy with relatively unremarkable.  - Continue followup with GI.  - LFTs today.  3. 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  Fluid grew MAC, followed by ID. Not currently requiring treatment. 4. Chronic  hypoxemic respiratory failure: In setting of COPD as well as RV failure/pulmonary hypertension. She remains on 3L oxygen. - Followup with pulmonary.   Followup in 3 months.   Loralie Champagne 09/13/22

## 2022-09-13 NOTE — Telephone Encounter (Signed)
Patient aware of labs. She indicated she had run out of her potassium and had not been taking. She is taking now. I scheduled her follow up labs

## 2022-09-20 ENCOUNTER — Telehealth (HOSPITAL_COMMUNITY): Payer: Self-pay | Admitting: Pharmacy Technician

## 2022-09-20 NOTE — Telephone Encounter (Signed)
Advanced Heart Failure Patient Advocate Encounter  [facility], patient has reached goal dose of Uptravi at 1600 mcg BID.  Charlann Boxer, CPhT

## 2022-09-23 ENCOUNTER — Other Ambulatory Visit: Payer: Self-pay | Admitting: Gastroenterology

## 2022-09-23 ENCOUNTER — Other Ambulatory Visit: Payer: Self-pay

## 2022-09-23 DIAGNOSIS — R188 Other ascites: Secondary | ICD-10-CM

## 2022-09-23 NOTE — Telephone Encounter (Signed)
Hi Dr. Candis Schatz, Please advise refilling Vitamin D 50000 unit Capsule. I already ordered Vitamin D lab and advised patient to come to our lab to recheck her label per your notes on 04/02/22. Is there any labs you would like to add? Thank you!

## 2022-09-24 ENCOUNTER — Other Ambulatory Visit (HOSPITAL_COMMUNITY): Payer: Medicaid Other

## 2022-09-24 ENCOUNTER — Telehealth (HOSPITAL_COMMUNITY): Payer: Self-pay

## 2022-09-24 NOTE — Telephone Encounter (Signed)
Received a fax requesting medical records from [facility] on behalf of [place]. Records were successfully faxed to: [phone] ,which was the number provided. No medical request form (Continuation of care).

## 2022-09-25 ENCOUNTER — Other Ambulatory Visit (HOSPITAL_COMMUNITY): Payer: Medicaid Other

## 2022-09-26 ENCOUNTER — Ambulatory Visit (HOSPITAL_COMMUNITY)
Admission: RE | Admit: 2022-09-26 | Discharge: 2022-09-26 | Disposition: A | Discharge status: Home / Self Care | Payer: Medicaid Other | Source: Ambulatory Visit | Attending: Cardiology | Admitting: Cardiology

## 2022-09-26 ENCOUNTER — Telehealth (HOSPITAL_COMMUNITY): Payer: Self-pay

## 2022-09-26 ENCOUNTER — Other Ambulatory Visit (INDEPENDENT_AMBULATORY_CARE_PROVIDER_SITE_OTHER): Payer: Medicaid Other

## 2022-09-26 DIAGNOSIS — I5032 Chronic diastolic (congestive) heart failure: Secondary | ICD-10-CM | POA: Diagnosis present

## 2022-09-26 DIAGNOSIS — R188 Other ascites: Secondary | ICD-10-CM | POA: Diagnosis not present

## 2022-09-26 DIAGNOSIS — K7460 Unspecified cirrhosis of liver: Secondary | ICD-10-CM

## 2022-09-26 DIAGNOSIS — E559 Vitamin D deficiency, unspecified: Secondary | ICD-10-CM

## 2022-09-26 LAB — BASIC METABOLIC PANEL
Anion gap: 9 (ref 5–15)
BUN: 10 mg/dL (ref 6–20)
CO2: 27 mmol/L (ref 22–32)
Calcium: 9.4 mg/dL (ref 8.9–10.3)
Chloride: 103 mmol/L (ref 98–111)
Creatinine, Ser: 0.85 mg/dL (ref 0.44–1.00)
GFR, Estimated: >60 mL/min (ref >60)
Glucose, Bld: 101 mg/dL — ABNORMAL HIGH (ref 70–99)
Potassium: 3.1 mmol/L — ABNORMAL LOW (ref 3.5–5.1)
Sodium: 139 mmol/L (ref 135–145)

## 2022-09-26 LAB — VITAMIN D 25 HYDROXY (VIT D DEFICIENCY, FRACTURES): VITD: 24.56 ng/mL — ABNORMAL LOW (ref 30.00–100.00)

## 2022-09-26 MED ORDER — POTASSIUM CHLORIDE CRYS ER 20 MEQ PO TBCR: 40.0000 meq | EXTENDED_RELEASE_TABLET | Freq: Every day | ORAL | 3 refills | Status: DC

## 2022-09-26 NOTE — Telephone Encounter (Addendum)
Pt aware, agreeable, and verbalized understanding  Labs scheduled and Rx updated   ----- Message from Larey Dresser, MD sent at 09/26/2022  3:25 PM EDT ----- Add additional KCl 20 daily to her regimen, BMET 10 days.

## 2022-09-29 NOTE — Progress Notes (Signed)
Shatyra,  Your vitamin D level is still a little low.  Let's treat with another 4 months of ergocalciferol and then recheck your levels again.  Maya,  Can you refill her ergocalciferol 50,000 U q week for 16 weeks #16 rf0 and an order for a repeat vitamin D level in 4 months?

## 2022-09-29 NOTE — H&P (View-Only) (Signed)
Dwan,  Your vitamin D level is still a little low.  Let's treat with another 4 months of ergocalciferol and then recheck your levels again.  Maya,  Can you refill her ergocalciferol 50,000 U q week for 16 weeks #16 rf0 and an order for a repeat vitamin D level in 4 months?

## 2022-09-30 MED ORDER — VITAMIN D (ERGOCALCIFEROL) 1.25 MG (50000 UNIT) PO CAPS: 50000.0000 [IU] | ORAL_CAPSULE | ORAL | 0 refills | Status: DC

## 2022-10-04 ENCOUNTER — Other Ambulatory Visit (HOSPITAL_COMMUNITY): Payer: Medicaid Other

## 2022-10-08 ENCOUNTER — Other Ambulatory Visit (HOSPITAL_COMMUNITY): Payer: Medicaid Other

## 2022-10-11 ENCOUNTER — Encounter (HOSPITAL_COMMUNITY): Payer: Self-pay

## 2022-10-11 ENCOUNTER — Other Ambulatory Visit (HOSPITAL_COMMUNITY): Payer: Medicaid Other

## 2022-10-13 ENCOUNTER — Other Ambulatory Visit (HOSPITAL_COMMUNITY): Payer: Self-pay | Admitting: Physician Assistant

## 2022-10-18 ENCOUNTER — Other Ambulatory Visit: Payer: Self-pay

## 2022-10-18 ENCOUNTER — Encounter (HOSPITAL_COMMUNITY): Payer: Self-pay | Admitting: Cardiology

## 2022-10-18 ENCOUNTER — Ambulatory Visit (HOSPITAL_COMMUNITY)
Admission: RE | Admit: 2022-10-18 | Discharge: 2022-10-18 | Disposition: A | Discharge status: Home / Self Care | Payer: Medicaid Other | Attending: Cardiology | Admitting: Cardiology

## 2022-10-18 ENCOUNTER — Encounter (HOSPITAL_COMMUNITY)
Admission: RE | Disposition: A | Discharge status: Home / Self Care | Payer: Self-pay | Source: Home / Self Care | Attending: Cardiology

## 2022-10-18 DIAGNOSIS — I272 Pulmonary hypertension, unspecified: Secondary | ICD-10-CM

## 2022-10-18 HISTORY — PX: RIGHT HEART CATH: CATH118263

## 2022-10-18 LAB — POCT I-STAT EG7
Acid-Base Excess: 3 mmol/L — ABNORMAL HIGH (ref 0.0–2.0)
Acid-Base Excess: 4 mmol/L — ABNORMAL HIGH (ref 0.0–2.0)
Bicarbonate: 29.4 mmol/L — ABNORMAL HIGH (ref 20.0–28.0)
Bicarbonate: 30.5 mmol/L — ABNORMAL HIGH (ref 20.0–28.0)
Calcium, Ion: 1.33 mmol/L (ref 1.15–1.40)
Calcium, Ion: 1.35 mmol/L (ref 1.15–1.40)
HCT: 36 % (ref 36.0–46.0)
HCT: 37 % (ref 36.0–46.0)
Hemoglobin: 12.2 g/dL (ref 12.0–15.0)
Hemoglobin: 12.6 g/dL (ref 12.0–15.0)
O2 Saturation: 67 %
O2 Saturation: 68 %
Potassium: 3.8 mmol/L (ref 3.5–5.1)
Potassium: 3.8 mmol/L (ref 3.5–5.1)
Sodium: 138 mmol/L (ref 135–145)
Sodium: 138 mmol/L (ref 135–145)
TCO2: 31 mmol/L (ref 22–32)
TCO2: 32 mmol/L (ref 22–32)
pCO2, Ven: 52.8 mmHg (ref 44–60)
pCO2, Ven: 54.6 mmHg (ref 44–60)
pH, Ven: 7.354 (ref 7.25–7.43)
pH, Ven: 7.355 (ref 7.25–7.43)
pO2, Ven: 37 mmHg (ref 32–45)
pO2, Ven: 38 mmHg (ref 32–45)

## 2022-10-18 LAB — BASIC METABOLIC PANEL
Anion gap: 7 (ref 5–15)
BUN: 11 mg/dL (ref 6–20)
CO2: 27 mmol/L (ref 22–32)
Calcium: 9.3 mg/dL (ref 8.9–10.3)
Chloride: 102 mmol/L (ref 98–111)
Creatinine, Ser: 0.91 mg/dL (ref 0.44–1.00)
GFR, Estimated: >60 mL/min (ref >60)
Glucose, Bld: 103 mg/dL — ABNORMAL HIGH (ref 70–99)
Potassium: 3.5 mmol/L (ref 3.5–5.1)
Sodium: 136 mmol/L (ref 135–145)

## 2022-10-18 LAB — CBC
HCT: 38.8 % (ref 36.0–46.0)
Hemoglobin: 11.7 g/dL — ABNORMAL LOW (ref 12.0–15.0)
MCH: 24.3 pg — ABNORMAL LOW (ref 26.0–34.0)
MCHC: 30.2 g/dL (ref 30.0–36.0)
MCV: 80.5 fL (ref 80.0–100.0)
Platelets: 117 10*3/uL — ABNORMAL LOW (ref 150–400)
RBC: 4.82 MIL/uL (ref 3.87–5.11)
RDW: 15.3 % (ref 11.5–15.5)
WBC: 3.3 10*3/uL — ABNORMAL LOW (ref 4.0–10.5)
nRBC: 0 % (ref 0.0–0.2)

## 2022-10-18 SURGERY — RIGHT HEART CATH
Anesthesia: LOCAL

## 2022-10-18 MED ORDER — SODIUM CHLORIDE 0.9 % IV SOLN: 250.0000 mL | INTRAVENOUS | Status: DC | PRN

## 2022-10-18 MED ORDER — SODIUM CHLORIDE 0.9% FLUSH: 3.0000 mL | INTRAVENOUS | Status: DC | PRN

## 2022-10-18 MED ORDER — LIDOCAINE HCL (PF) 1 % IJ SOLN
INTRAMUSCULAR | Status: AC
  Filled 2022-10-18: qty 30

## 2022-10-18 MED ORDER — HYDRALAZINE HCL 20 MG/ML IJ SOLN: 10.0000 mg | INTRAMUSCULAR | Status: DC | PRN

## 2022-10-18 MED ORDER — LABETALOL HCL 5 MG/ML IV SOLN: 10.0000 mg | INTRAVENOUS | Status: DC | PRN

## 2022-10-18 MED ORDER — LIDOCAINE HCL (PF) 1 % IJ SOLN
INTRAMUSCULAR | Status: DC | PRN
  Administered 2022-10-18: 2 mL

## 2022-10-18 MED ORDER — HEPARIN (PORCINE) IN NACL 1000-0.9 UT/500ML-% IV SOLN
INTRAVENOUS | Status: DC | PRN
  Administered 2022-10-18: 500 mL

## 2022-10-18 MED ORDER — ACETAMINOPHEN 325 MG PO TABS: 650.0000 mg | ORAL_TABLET | ORAL | Status: DC | PRN

## 2022-10-18 MED ORDER — SODIUM CHLORIDE 0.9% FLUSH: 3.0000 mL | Freq: Two times a day (BID) | INTRAVENOUS | Status: DC

## 2022-10-18 MED ORDER — SODIUM CHLORIDE 0.9 % IV SOLN: INTRAVENOUS | Status: DC

## 2022-10-18 MED ORDER — ONDANSETRON HCL 4 MG/2ML IJ SOLN: 4.0000 mg | Freq: Four times a day (QID) | INTRAMUSCULAR | Status: DC | PRN

## 2022-10-18 SURGICAL SUPPLY — 5 items
CATH BALLN WEDGE 5F 110CM (CATHETERS) IMPLANT
KIT HEART LEFT (KITS) ×2 IMPLANT
PACK CARDIAC CATHETERIZATION (CUSTOM PROCEDURE TRAY) ×2 IMPLANT
SHEATH GLIDE SLENDER 4/5FR (SHEATH) IMPLANT
TRANSDUCER W/STOPCOCK (MISCELLANEOUS) ×2 IMPLANT

## 2022-10-18 NOTE — Discharge Instructions (Signed)

## 2022-10-18 NOTE — Interval H&P Note (Signed)
History and Physical Interval Note:  10/18/2022 11:37 AM  Pamela Ho  has presented today for surgery, with the diagnosis of pulmonary htn.  The various methods of treatment have been discussed with the patient and family. After consideration of risks, benefits and other options for treatment, the patient has consented to  Procedure(s): RIGHT HEART CATH (N/A) as a surgical intervention.  The patient's history has been reviewed, patient examined, no change in status, stable for surgery.  I have reviewed the patient's chart and labs.  Questions were answered to the patient's satisfaction.     Hoang Pettingill [employer]

## 2022-10-23 ENCOUNTER — Telehealth (HOSPITAL_COMMUNITY): Payer: Self-pay

## 2022-10-23 NOTE — Telephone Encounter (Signed)
Oxygen orders faxed to [phone] on 10/23/22

## 2022-10-29 ENCOUNTER — Other Ambulatory Visit (HOSPITAL_COMMUNITY): Payer: Self-pay

## 2022-10-29 NOTE — Progress Notes (Deleted)
Office Visit Note  Patient: Pamela Ho             Date of Birth: 20-Sep-1970           MRN: [id number]             PCP: Gwyneth Sprout, MD Referring: Jacklynn Ganong, FNP Visit Date: 10/30/2022 Occupation: @GUAROCC @  Subjective:  No chief complaint on file.   History of Present Illness: Pamela Ho is a 52 y.o. female here for evaluation of highly positive RF checked in association with CHF and pulmonary HTN. Also with liver cirrhosis with unclear etiology previously suspect NAFLD. ***     Activities of Daily Living:  Patient reports morning stiffness for *** {minute/hour:19697}.   Patient {ACTIONS;DENIES/REPORTS:21021675::"Denies"} nocturnal pain.  Difficulty dressing/grooming: {ACTIONS;DENIES/REPORTS:21021675::"Denies"} Difficulty climbing stairs: {ACTIONS;DENIES/REPORTS:21021675::"Denies"} Difficulty getting out of chair: {ACTIONS;DENIES/REPORTS:21021675::"Denies"} Difficulty using hands for taps, buttons, cutlery, and/or writing: {ACTIONS;DENIES/REPORTS:21021675::"Denies"}  No Rheumatology ROS completed.   PMFS History:  Patient Active Problem List   Diagnosis Date Noted   Rheumatism 10/09/2021   Heart failure, type unknown (HCC) 08/30/2021   Thrombocytopenia (HCC) 08/10/2021   Adrenal mass (HCC) 08/09/2021   Pleural effusion on right 08/09/2021   Acute respiratory failure with hypoxia (HCC) 08/07/2021   AKI (acute kidney injury) (HCC) 08/07/2021   Glucose intolerance 08/07/2021   Cirrhosis of liver (HCC) 08/06/2021   Generalized anxiety disorder 08/06/2021   GERD without esophagitis 08/06/2021   Hypokalemia 08/06/2021   Chronic diastolic heart failure (HCC) 08/06/2021   Elevated troponin level not due myocardial infarction 08/05/2021   Nicotine dependence, cigarettes, uncomplicated 08/05/2021   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   Hypertensive urgency 02/08/2015   Headache 02/08/2015    Past Medical History:  Diagnosis Date    Acute congestive heart failure (HCC) 08/05/2021   Anemia    Anxiety    COPD (chronic obstructive pulmonary disease) (HCC)    Depression    GERD (gastroesophageal reflux disease)    Heart murmur    Hypertension     Family History  Problem Relation Age of Onset   Diabetes Mother    Heart disease Mother    Hyperlipidemia Mother    Diabetes Father    Cancer Father    Heart disease Father    Hyperlipidemia Father    Hypertension Father    Heart disease Maternal Grandmother    Hyperlipidemia Maternal Grandmother    Hypertension Maternal Grandmother    Heart disease Maternal Grandfather    Hyperlipidemia Maternal Grandfather    Hypertension Maternal Grandfather    Heart disease Paternal Grandmother    Hyperlipidemia Paternal Grandmother    Hypertension Paternal Grandmother    Stroke Paternal Grandmother    Diabetes Paternal Grandmother    Cancer Paternal Grandmother    Breast cancer Neg Hx    Past Surgical History:  Procedure Laterality Date   CHOLECYSTECTOMY     cyst from neck     IR THORACENTESIS ASP PLEURAL SPACE W/IMG GUIDE  08/09/2021   RIGHT HEART CATH N/A 08/10/2021   Procedure: RIGHT HEART CATH;  Surgeon: Laurey Morale, MD;  [facility];  Service: Cardiovascular;  Laterality: N/A;   RIGHT HEART CATH N/A 10/18/2022   Procedure: RIGHT HEART CATH;  Surgeon: Laurey Morale, MD;  Location: [facility] IN[facility]AS[facility];  Service: Cardiovascular;  Laterality: N/A;   Social History   Social History Narrative   Not on file   Immunization History  Administered Date(s) Administered   Influenza-Unspecified  02/22/2021   PFIZER Comirnaty(Gray Top)Covid-19 Tri-Sucrose Vaccine 04/24/2020, 05/23/2020     Objective: Vital Signs: LMP 06/24/2017 (Approximate)    Physical Exam   Musculoskeletal Exam: ***  CDAI Exam: CDAI Score: -- Patient Global: --; Provider Global: -- Swollen: --; Tender: -- Joint Exam 10/30/2022   No joint exam has been documented for this  visit   There is currently no information documented on the homunculus. Go to the Rheumatology activity and complete the homunculus joint exam.  Investigation: No additional findings.  Imaging: CARDIAC CATHETERIZATION  Result Date: 10/18/2022 1. Filling pressures not significantly elevated. 2. Mild pulmonary hypertension, much improved from prior study.  No changes to meds.    Recent Labs: Ho Results  Component Value Date   WBC 3.3 (L) 10/18/2022   HGB 12.2 10/18/2022   PLT 117 (L) 10/18/2022   NA 138 10/18/2022   K 3.8 10/18/2022   CL 102 10/18/2022   CO2 27 10/18/2022   GLUCOSE 103 (H) 10/18/2022   BUN 11 10/18/2022   CREATININE 0.91 10/18/2022   BILITOT 0.6 09/12/2022   ALKPHOS 101 09/12/2022   AST 19 09/12/2022   ALT 16 09/12/2022   PROT 6.8 09/12/2022   ALBUMIN 3.5 09/12/2022   CALCIUM 9.3 10/18/2022   GFRAA >60 02/09/2015    Speciality Comments: No specialty comments available.  Procedures:  No procedures performed Allergies: Tyvaso [treprostinil], Lexapro [escitalopram], and Lisinopril   Assessment / Plan:     Visit Diagnoses: No diagnosis found.  Orders: No orders of the defined types were placed in this encounter.  No orders of the defined types were placed in this encounter.   Face-to-face time spent with patient was *** minutes. Greater than 50% of time was spent in counseling and coordination of care.  Follow-Up Instructions: No follow-ups on file.   Fuller Plan, MD  Note - This record has been created using [employer].  Chart creation errors have been sought, but may not always  have been located. Such creation errors do not reflect on  the standard of medical care.

## 2022-10-30 ENCOUNTER — Encounter: Payer: Medicaid Other | Admitting: Internal Medicine

## 2022-10-30 ENCOUNTER — Telehealth (HOSPITAL_COMMUNITY): Payer: Self-pay | Admitting: Pharmacist

## 2022-10-30 NOTE — Telephone Encounter (Signed)
Patient Advocate Encounter   Received notification from [facility] Medicaid that prior authorization for Opsumit is required.   PA submitted on [place] [employer] Confirmation #: [id number] W Recipient ID: [id number] Q Status is pending   Will continue to follow.   Karle Plumber, PharmD, BCPS, BCCP, CPP Heart Failure Clinic Pharmacist [phone]

## 2022-10-31 NOTE — Telephone Encounter (Signed)
Advanced Heart Failure Patient Advocate Encounter  Prior Authorization for Opsumit has been approved.    PA# [id number] Effective dates: 10/30/2022 - 10/30/2023  Karle Plumber, PharmD, BCPS, BCCP, CPP Heart Failure Clinic Pharmacist [phone]

## 2022-11-01 ENCOUNTER — Telehealth (HOSPITAL_COMMUNITY): Payer: Self-pay | Admitting: Licensed Clinical Social Worker

## 2022-11-01 NOTE — Telephone Encounter (Signed)
H&V Care Navigation CSW Progress Note  Clinical Social Worker received call from pt to discuss some concerns.  Pt reports she was given SSDI last Oct but had received a denial for SSI- CSW explained that this is because pt has sufficient work history so wasn't eligible for SSI.  Pt reports she gets $1,100/month.  Pt also worried bout losing Medicaid because someone told her she shouldn't qualify given her income.  States she was approved for Medicaid prior to getting disability.  CSW informed pt of income limits for Medicaid and she confirms that she and her spouse are over as a couple.  Likely pt will not qualify for Medicaid next time they review her case.  CSW discussed next steps if this is the case like informing us if she gets notice her benefits are stopping so we can figure out getting medications and other coverage.  No further needs at this time   SDOH Screenings   Food Insecurity: No Food Insecurity (08/07/2021)  Housing: Low Risk  (08/07/2021)  Transportation Needs: No Transportation Needs (08/07/2021)  Alcohol Screen: Low Risk  (08/07/2021)  Depression (PHQ2-9): Low Risk  (06/05/2022)  Financial Resource Strain: High Risk (08/07/2021)  Tobacco Use: Medium Risk (10/18/2022)    Burna Sis, LCSW Clinical Social Worker [facility] Desk#: [phone] Cell#: [phone]

## 2022-11-11 ENCOUNTER — Telehealth (HOSPITAL_COMMUNITY): Payer: Self-pay | Admitting: Pharmacy Technician

## 2022-11-11 ENCOUNTER — Other Ambulatory Visit (HOSPITAL_COMMUNITY): Payer: Self-pay

## 2022-11-11 NOTE — Telephone Encounter (Signed)
Advanced Heart Failure Patient Advocate Encounter  Now that patient has traditional medicaid with an approved authorization, she will no longer qualify for assistance through [employer] ([facility] dispensing). Her assistance was set to end June 06 this year. Called and spoke with [facility] about the change. They are going to make sure the patient has medication before closing her case out there.  Spoke with [employer]. Provided PA and insurance information. They are aware that we want the [facility] red[facility] (patient receives Cordie Grice here as well).  Will follow up to ensure transfer is successful.

## 2022-11-12 ENCOUNTER — Other Ambulatory Visit (HOSPITAL_COMMUNITY): Payer: Self-pay | Admitting: Pharmacist

## 2022-11-12 MED ORDER — OPSUMIT 10 MG PO TABS: 10.0000 mg | ORAL_TABLET | Freq: Every morning | ORAL | 0 refills | Status: DC

## 2022-11-19 ENCOUNTER — Other Ambulatory Visit (HOSPITAL_COMMUNITY): Payer: Self-pay | Admitting: Pharmacist

## 2022-11-19 MED ORDER — OPSUMIT 10 MG PO TABS: 10.0000 mg | ORAL_TABLET | Freq: Every morning | ORAL | 11 refills | Status: DC

## 2022-11-22 ENCOUNTER — Other Ambulatory Visit (HOSPITAL_COMMUNITY): Payer: Self-pay

## 2022-11-22 NOTE — Telephone Encounter (Signed)
Advanced Heart Failure Patient Advocate Encounter  [facility] saying they have not received the referral from the HUB yet. Called [employer] who stated they closed the referral out on 05/20 after they confirmed the patient had 30 days of Opsumit at the time. Was advised to [facility], this has already been done. [facility] cannot dispense the medication without receiving the referral from the [facility]. Called the HUB [facility]). [employer] did not inform them of any changes to the patient's referral.   Provided insurance and PA information. The HUB is aware we want the referral triaged over to [facility] once they complete the benefits investigation.   Will follow up.

## 2022-12-02 ENCOUNTER — Telehealth (HOSPITAL_COMMUNITY): Payer: Self-pay

## 2022-12-02 NOTE — Telephone Encounter (Signed)
Oxygen orders faxed to [phone]  receipt of confirmation received.

## 2022-12-03 NOTE — Telephone Encounter (Signed)
Advanced Heart Failure Patient Advocate Encounter  Patient is scheduled to receive [facility] on 12/05/22.  Archer Asa, CPhT

## 2022-12-20 ENCOUNTER — Telehealth (HOSPITAL_COMMUNITY): Payer: Self-pay

## 2022-12-20 NOTE — Telephone Encounter (Signed)
Order faxed to [facility] [facility] on 12/20/22 to [phone]

## 2023-01-14 ENCOUNTER — Other Ambulatory Visit: Payer: Self-pay

## 2023-01-14 MED ORDER — VITAMIN D (ERGOCALCIFEROL) 1.25 MG (50000 UNIT) PO CAPS: 50000.0000 [IU] | ORAL_CAPSULE | ORAL | 0 refills | Status: AC

## 2023-03-10 ENCOUNTER — Encounter: Payer: Medicaid Other | Admitting: Internal Medicine

## 2023-03-10 NOTE — Progress Notes (Deleted)
Office Visit Note  Patient: Pamela Ho             Date of Birth: [birth year]           MRN: [id number]             PCP: Gwyneth Sprout, MD Referring: Jacklynn Ganong, FNP Visit Date: 03/10/2023 Occupation: @GUAROCC @  Subjective:  No chief complaint on file.   History of Present Illness: Pamela Ho is a 52 y.o. female ***     Activities of Daily Living:  Patient reports morning stiffness for *** {minute/hour:19697}.   Patient {ACTIONS;DENIES/REPORTS:21021675::"Denies"} nocturnal pain.  Difficulty dressing/grooming: {ACTIONS;DENIES/REPORTS:21021675::"Denies"} Difficulty climbing stairs: {ACTIONS;DENIES/REPORTS:21021675::"Denies"} Difficulty getting out of chair: {ACTIONS;DENIES/REPORTS:21021675::"Denies"} Difficulty using hands for taps, buttons, cutlery, and/or writing: {ACTIONS;DENIES/REPORTS:21021675::"Denies"}  No Rheumatology ROS completed.   PMFS History:  Patient Active Problem List   Diagnosis Date Noted   Rheumatism 10/09/2021   Heart failure, type unknown (HCC) 08/30/2021   Thrombocytopenia (HCC) 08/10/2021   Adrenal mass (HCC) 08/09/2021   Pleural effusion on right 08/09/2021   Acute respiratory failure with hypoxia (HCC) 08/07/2021   AKI (acute kidney injury) (HCC) 08/07/2021   Glucose intolerance 08/07/2021   Cirrhosis of liver (HCC) 08/06/2021   Generalized anxiety disorder 08/06/2021   GERD without esophagitis 08/06/2021   Hypokalemia 08/06/2021   Chronic diastolic heart failure (HCC) 08/06/2021   Elevated troponin level not due myocardial infarction 08/05/2021   Nicotine dependence, cigarettes, uncomplicated 08/05/2021   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   Hypertensive urgency 02/08/2015   Headache 02/08/2015    Past Medical History:  Diagnosis Date   Acute congestive heart failure (HCC) 08/05/2021   Anemia    Anxiety    COPD (chronic obstructive pulmonary disease) (HCC)    Depression    GERD (gastroesophageal  reflux disease)    Heart murmur    Hypertension     Family History  Problem Relation Age of Onset   Diabetes Mother    Heart disease Mother    Hyperlipidemia Mother    Diabetes Father    Cancer Father    Heart disease Father    Hyperlipidemia Father    Hypertension Father    Heart disease Maternal Grandmother    Hyperlipidemia Maternal Grandmother    Hypertension Maternal Grandmother    Heart disease Maternal Grandfather    Hyperlipidemia Maternal Grandfather    Hypertension Maternal Grandfather    Heart disease Paternal Grandmother    Hyperlipidemia Paternal Grandmother    Hypertension Paternal Grandmother    Stroke Paternal Grandmother    Diabetes Paternal Grandmother    Cancer Paternal Grandmother    Breast cancer Neg Hx    Past Surgical History:  Procedure Laterality Date   CHOLECYSTECTOMY     cyst from neck     IR THORACENTESIS ASP PLEURAL SPACE W/IMG GUIDE  08/09/2021   RIGHT HEART CATH N/A 08/10/2021   Procedure: RIGHT HEART CATH;  Surgeon: Laurey Morale, MD;  Location: [facility] INVASIVE CV LAB;  Service: Cardiovascular;  Laterality: N/A;   RIGHT HEART CATH N/A 10/18/2022   Procedure: RIGHT HEART CATH;  Surgeon: Laurey Morale, MD;  Location: [facility] INVASIVE CV LAB;  Service: Cardiovascular;  Laterality: N/A;   Social History   Social History Narrative   Not on file   Immunization History  Administered Date(s) Administered   Influenza-Unspecified 02/22/2021   PFIZER Comirnaty(Gray Top)Covid-19 Tri-Sucrose Vaccine 04/24/2020, 05/23/2020     Objective: Vital Signs: LMP 06/24/2017 (Approximate)    Physical Exam  Musculoskeletal Exam: ***  CDAI Exam: CDAI Score: -- Patient Global: --; Provider Global: -- Swollen: --; Tender: -- Joint Exam 03/10/2023   No joint exam has been documented for this visit   There is currently no information documented on the homunculus. Go to the Rheumatology activity and complete the homunculus joint exam.  Investigation: No  additional findings.  Imaging: No results found.  Recent Labs: Lab Results  Component Value Date   WBC 3.3 (L) 10/18/2022   HGB 12.2 10/18/2022   PLT 117 (L) 10/18/2022   NA 138 10/18/2022   K 3.8 10/18/2022   CL 102 10/18/2022   CO2 27 10/18/2022   GLUCOSE 103 (H) 10/18/2022   BUN 11 10/18/2022   CREATININE 0.91 10/18/2022   BILITOT 0.6 09/12/2022   ALKPHOS 101 09/12/2022   AST 19 09/12/2022   ALT 16 09/12/2022   PROT 6.8 09/12/2022   ALBUMIN 3.5 09/12/2022   CALCIUM 9.3 10/18/2022   GFRAA >60 02/09/2015    Speciality Comments: No specialty comments available.  Procedures:  No procedures performed Allergies: Tyvaso [treprostinil], Lexapro [escitalopram], and Lisinopril   Assessment / Plan:     Visit Diagnoses: No diagnosis found.  Orders: No orders of the defined types were placed in this encounter.  No orders of the defined types were placed in this encounter.   Face-to-face time spent with patient was *** minutes. Greater than 50% of time was spent in counseling and coordination of care.  Follow-Up Instructions: No follow-ups on file.   Fuller Plan, MD  Note - This record has been created using [employer].  Chart creation errors have been sought, but may not always  have been located. Such creation errors do not reflect on  the standard of medical care.

## 2023-04-04 ENCOUNTER — Other Ambulatory Visit (HOSPITAL_COMMUNITY): Payer: Self-pay | Admitting: Family Medicine

## 2023-04-14 ENCOUNTER — Telehealth (HOSPITAL_COMMUNITY): Payer: Self-pay | Admitting: Licensed Clinical Social Worker

## 2023-04-14 NOTE — Telephone Encounter (Signed)
H&V Care Navigation CSW Progress Note  Clinical Social Worker received call from pt to inform CSW that her Medicaid is ending 10/31 and she will not have insurance.  CSW discussed applying for [facility] insurance- she will look into if it is affordable for her.  Patient disability started Feb 2023 so will be eligible for Medicare in Feb of next year.    Main concern is keeping on medications until she can get medicare- discussed [employer] insurance vs [place] helping look into patient assistance and other methods to get medications affordably until she can get insurance in Feb.  Patient will call back CSW to discuss further after looking into [facility] insurance.   SDOH Screenings   Food Insecurity: No Food Insecurity (08/07/2021)  Housing: Low Risk  (08/07/2021)  Transportation Needs: No Transportation Needs (08/07/2021)  Alcohol Screen: Low Risk  (08/07/2021)  Depression (PHQ2-9): Low Risk  (06/05/2022)  Financial Resource Strain: High Risk (08/07/2021)  Tobacco Use: Medium Risk (09/12/2022)    Burna Sis, LCSW Clinical Social Worker [facility] Desk#: [phone] Cell#: [phone]

## 2023-04-17 ENCOUNTER — Telehealth (HOSPITAL_COMMUNITY): Payer: Self-pay | Admitting: Licensed Clinical Social Worker

## 2023-04-17 NOTE — Telephone Encounter (Signed)
H&V Care Navigation CSW Progress Note  Clinical Social Worker called pt to check in regarding insurance decision.  She has looked at [employer] but given high deductible that she must meet before they help with meds she cannot afford at this time.    CSW discussed applying for CAFA to help with cone bills and utilizing heart failure fund and patient assistance programs to assist with other meds.  She believes most her non heart failure meds are available through [facility] for $4.  CSW messaged pharmacy team to assist with opsumit and uptravi applications for assistance.  Patient is participating in a Managed Medicaid Plan:  Yes  SDOH Screenings   Food Insecurity: No Food Insecurity (08/07/2021)  Housing: Low Risk  (08/07/2021)  Transportation Needs: No Transportation Needs (08/07/2021)  Alcohol Screen: Low Risk  (08/07/2021)  Depression (PHQ2-9): Low Risk  (06/05/2022)  Financial Resource Strain: High Risk (08/07/2021)  Tobacco Use: Medium Risk (09/12/2022)    Burna Sis, LCSW Clinical Social Worker [facility] Desk#: [phone] Cell#: [phone]

## 2023-05-07 ENCOUNTER — Other Ambulatory Visit (HOSPITAL_COMMUNITY): Payer: Self-pay

## 2023-05-07 ENCOUNTER — Telehealth (HOSPITAL_COMMUNITY): Payer: Self-pay | Admitting: Pharmacy Technician

## 2023-05-07 NOTE — Telephone Encounter (Signed)
Advanced Heart Failure Patient Advocate Encounter  Patient has lost insurance and does not anticipate obtaining new insurance until February. Started patient assistance forms for Opsumit and Uptravi through [employer].  Will fax once all signatures are obtained.

## 2023-05-08 NOTE — Telephone Encounter (Signed)
Advanced Heart Failure Patient Advocate Encounter  Application sent in late 11/13. Document scanned to chart.   Will follow up.

## 2023-05-12 NOTE — Telephone Encounter (Signed)
Advanced Heart Failure Patient Advocate Encounter  Per [employer] esignature is not allowed. Was able to obtain physical patient signature, sent in via fax.

## 2023-05-14 NOTE — Telephone Encounter (Signed)
Advanced Heart Failure Patient Advocate Encounter  Called patient, asked if she had heard anything else since we resent her application, she has not. Called [employer], they stated they did not receive the fax sent on 11/18 and were no longer accepting new applications at this time (even though the application itself was started before the deadline).  Was redirected to [employer]. Started new application. Spoke with patient. Sent in via fax.   Will follow up.

## 2023-05-19 ENCOUNTER — Other Ambulatory Visit (HOSPITAL_COMMUNITY): Payer: Self-pay | Admitting: Pharmacist

## 2023-05-19 MED ORDER — OPSUMIT 10 MG PO TABS: 10.0000 mg | ORAL_TABLET | Freq: Every morning | ORAL | 11 refills | Status: DC

## 2023-05-19 NOTE — Telephone Encounter (Signed)
Advanced Heart Failure Patient Advocate Encounter   Patient was approved to receive Opsumit and Uptravi from [place].  Effective dates: 05/15/23 through 06/23/24  Called and spoke with [facility] D[facility]bles. They have approved the patient for assistance but not sent the information to the pharmacies as of yet. [facility]. The Opsumit will come from [facility] pharmacy. Called and spoke with the patient. She is going to call me back if she does not hear from them by mid week.

## 2023-05-27 NOTE — Telephone Encounter (Signed)
Advanced Heart Failure Patient Advocate Encounter  Patient called and left vm stating she has not received medications yet. Called [employer]. The representative stated that she was not sure why the patient was not contacted sooner but that she was suppose to be contacted yesterday. The case manager was suppose to contact me back, I have yet to hear from them. I called and spoke with the patient and requested that she give [facility] a call instead of waiting for them to reach out to her.  Called patient this morning. She stated that she was disconnected after holding for about 14 minutes. I advised her to give me a call back after she has an opportunity to speak with a representative.

## 2023-05-28 NOTE — Progress Notes (Unsigned)
Office Visit Note  Patient: Pamela Ho             Date of Birth: [birth year]           MRN: [id number]             PCP: Gwyneth Sprout, MD Referring: Jacklynn Ganong, FNP Visit Date: 05/29/2023 Occupation: @GUAROCC @  Subjective:  No chief complaint on file.   History of Present Illness: Pamela Ho is a 52 y.o. female here for evaluation of highly positive RF checked associated with pulmonary hypertension.***     Activities of Daily Living:  Patient reports morning stiffness for *** {minute/hour:19697}.   Patient {ACTIONS;DENIES/REPORTS:21021675::"Denies"} nocturnal pain.  Difficulty dressing/grooming: {ACTIONS;DENIES/REPORTS:21021675::"Denies"} Difficulty climbing stairs: {ACTIONS;DENIES/REPORTS:21021675::"Denies"} Difficulty getting out of chair: {ACTIONS;DENIES/REPORTS:21021675::"Denies"} Difficulty using hands for taps, buttons, cutlery, and/or writing: {ACTIONS;DENIES/REPORTS:21021675::"Denies"}  No Rheumatology ROS completed.   PMFS History:  Patient Active Problem List   Diagnosis Date Noted   Rheumatism 10/09/2021   Heart failure, type unknown (HCC) 08/30/2021   Thrombocytopenia (HCC) 08/10/2021   Adrenal mass (HCC) 08/09/2021   Pleural effusion on right 08/09/2021   Acute respiratory failure with hypoxia (HCC) 08/07/2021   AKI (acute kidney injury) (HCC) 08/07/2021   Glucose intolerance 08/07/2021   Cirrhosis of liver (HCC) 08/06/2021   Generalized anxiety disorder 08/06/2021   GERD without esophagitis 08/06/2021   Hypokalemia 08/06/2021   Chronic diastolic heart failure (HCC) 08/06/2021   Elevated troponin level not due myocardial infarction 08/05/2021   Nicotine dependence, cigarettes, uncomplicated 08/05/2021   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   Hypertensive urgency 02/08/2015   Headache 02/08/2015    Past Medical History:  Diagnosis Date   Acute congestive heart failure (HCC) 08/05/2021   Anemia    Anxiety    COPD  (chronic obstructive pulmonary disease) (HCC)    Depression    GERD (gastroesophageal reflux disease)    Heart murmur    Hypertension     Family History  Problem Relation Age of Onset   Diabetes Mother    Heart disease Mother    Hyperlipidemia Mother    Diabetes Father    Cancer Father    Heart disease Father    Hyperlipidemia Father    Hypertension Father    Heart disease Maternal Grandmother    Hyperlipidemia Maternal Grandmother    Hypertension Maternal Grandmother    Heart disease Maternal Grandfather    Hyperlipidemia Maternal Grandfather    Hypertension Maternal Grandfather    Heart disease Paternal Grandmother    Hyperlipidemia Paternal Grandmother    Hypertension Paternal Grandmother    Stroke Paternal Grandmother    Diabetes Paternal Grandmother    Cancer Paternal Grandmother    Breast cancer Neg Hx    Past Surgical History:  Procedure Laterality Date   CHOLECYSTECTOMY     cyst from neck     IR THORACENTESIS ASP PLEURAL SPACE W/IMG GUIDE  08/09/2021   RIGHT HEART CATH N/A 08/10/2021   Procedure: RIGHT HEART CATH;  Surgeon: Laurey Morale, MD;  Location: [facility] INVASIVE CV LAB;  Service: Cardiovascular;  Laterality: N/A;   RIGHT HEART CATH N/A 10/18/2022   Procedure: RIGHT HEART CATH;  Surgeon: Laurey Morale, MD;  Location: [facility] INVASIVE CV LAB;  Service: Cardiovascular;  Laterality: N/A;   Social History   Social History Narrative   Not on file   Immunization History  Administered Date(s) Administered   Influenza-Unspecified 02/22/2021   PFIZER Comirnaty(Gray Top)Covid-19 Tri-Sucrose Vaccine 04/24/2020, 05/23/2020  Objective: Vital Signs: LMP 06/24/2017 (Approximate)    Physical Exam   Musculoskeletal Exam: ***  CDAI Exam: CDAI Score: -- Patient Global: --; Provider Global: -- Swollen: --; Tender: -- Joint Exam 05/29/2023   No joint exam has been documented for this visit   There is currently no information documented on the homunculus. Go to  the Rheumatology activity and complete the homunculus joint exam.  Investigation: No additional findings.  Imaging: No results found.  Recent Labs: Lab Results  Component Value Date   WBC 3.3 (L) 10/18/2022   HGB 12.2 10/18/2022   PLT 117 (L) 10/18/2022   NA 138 10/18/2022   K 3.8 10/18/2022   CL 102 10/18/2022   CO2 27 10/18/2022   GLUCOSE 103 (H) 10/18/2022   BUN 11 10/18/2022   CREATININE 0.91 10/18/2022   BILITOT 0.6 09/12/2022   ALKPHOS 101 09/12/2022   AST 19 09/12/2022   ALT 16 09/12/2022   PROT 6.8 09/12/2022   ALBUMIN 3.5 09/12/2022   CALCIUM 9.3 10/18/2022   GFRAA >60 02/09/2015    Speciality Comments: No specialty comments available.  Procedures:  No procedures performed Allergies: Tyvaso [treprostinil], Lexapro [escitalopram], and Lisinopril   Assessment / Plan:     Visit Diagnoses: No diagnosis found.  Orders: No orders of the defined types were placed in this encounter.  No orders of the defined types were placed in this encounter.   Face-to-face time spent with patient was *** minutes. Greater than 50% of time was spent in counseling and coordination of care.  Follow-Up Instructions: No follow-ups on file.   Fuller Plan, MD  Note - This record has been created using [employer].  Chart creation errors have been sought, but may not always  have been located. Such creation errors do not reflect on  the standard of medical care.

## 2023-05-29 ENCOUNTER — Encounter: Payer: Medicaid Other | Admitting: Internal Medicine

## 2023-05-29 NOTE — Telephone Encounter (Signed)
Advanced Heart Failure Patient Advocate Encounter  Patient left vm confirming that she did receive Opsumit from [facility]. She has not received [facility] as of yet. States that she did [facility] stating [facility] are working [facility] it.  Will reach back out to confirm Uptravi status.

## 2023-06-09 ENCOUNTER — Ambulatory Visit: Payer: Self-pay | Admitting: Internal Medicine

## 2023-06-09 NOTE — Telephone Encounter (Signed)
Advanced Heart Failure Patient Advocate Encounter  Received [facility] from [facility] that [facility] states [facility] do not have the approval information from [place] on file. Called [facility]. Representative stated that it was sent on 12/06. I asked her to resend it while I call [facility]. [facility]. They state they have not received the approval information. They are set to call the patient tomorrow. Hopefully they will have the approval in their system by then. Called and updated the patient.

## 2023-06-11 NOTE — Telephone Encounter (Signed)
Advanced Heart Failure Patient Advocate Encounter  Patient confirmed that Pamela Ho is to be delivered today. Advised her to call back if the medication is not delivered.  Archer Asa, CPhT

## 2023-06-30 ENCOUNTER — Telehealth (HOSPITAL_COMMUNITY): Payer: Self-pay | Admitting: Cardiology

## 2023-06-30 ENCOUNTER — Ambulatory Visit (HOSPITAL_COMMUNITY)
Admission: RE | Admit: 2023-06-30 | Discharge: 2023-06-30 | Disposition: A | Discharge status: Home / Self Care | Payer: Self-pay | Source: Ambulatory Visit | Attending: Cardiology | Admitting: Cardiology

## 2023-06-30 DIAGNOSIS — R609 Edema, unspecified: Secondary | ICD-10-CM | POA: Insufficient documentation

## 2023-06-30 NOTE — Telephone Encounter (Signed)
 Patient called to report swelling behind L thigh Feels tight and full-no visible swelling ?bakers cyst/clot  No weights to report No SOB No CP No redness not warm to the touch Lasix  40 daily (ordered 40/20,flet PM 20 was too much) -pt is concerned as swelling is similar to that when it was injured years ago and lasix  is not helping  Please advise

## 2023-06-30 NOTE — Telephone Encounter (Signed)
 Pt aware. Order placed.

## 2023-08-21 ENCOUNTER — Telehealth (HOSPITAL_COMMUNITY): Payer: Self-pay | Admitting: Licensed Clinical Social Worker

## 2023-08-21 NOTE — Telephone Encounter (Signed)
 H&V Care Navigation CSW Progress Note  Patient at clinic accompanying her mother during a clinic visit- asked to speak with CSW regarding personal concerns.  Pt wanting to ask how to get established with medicare now that she has been on disability for 2 years.  Explained that she would need ot call [employer] to enroll in benefits and confirm when she is eligible for benefits to begin.  Informed that she could not enroll in full benefits like part D through [facility] and provided number for [facility] program to speak with insurance counselor about plans as well as Extra Help program to help with premium costs.  No further questions  at this time   SDOH Screenings   Food Insecurity: No Food Insecurity (08/07/2021)  Housing: Low Risk  (08/07/2021)  Transportation Needs: No Transportation Needs (08/07/2021)  Alcohol Screen: Low Risk  (08/07/2021)  Depression (PHQ2-9): Low Risk  (06/05/2022)  Financial Resource Strain: High Risk (08/07/2021)  Tobacco Use: Medium Risk (09/12/2022)   Burna Sis, LCSW Clinical Social Worker Advanced [facility] Failure Clinic Desk#: [phone] Cell#: [phone]

## 2023-09-08 ENCOUNTER — Other Ambulatory Visit (HOSPITAL_COMMUNITY): Payer: Self-pay | Admitting: Cardiology

## 2023-09-09 ENCOUNTER — Telehealth (HOSPITAL_COMMUNITY): Payer: Self-pay

## 2023-09-09 NOTE — Telephone Encounter (Signed)
 Call received from Mrs. Howdyshell who reports she is out of her Lasix and has missed the last three days of her dosing. She says she called the pharmacy and they said she is needing an appointment to get it filled.   I instructed her to call and schedule her appointment and hopefully the medication refill will be processed.   I will forward to triage to make them aware that she is in the process of scheduling her apt. for refills.   Call complete.   Maralyn Sago, EMT-Paramedic [phone] 09/09/2023

## 2023-09-10 MED ORDER — FUROSEMIDE 40 MG PO TABS: ORAL_TABLET | ORAL | 0 refills | Status: DC

## 2023-09-10 NOTE — Telephone Encounter (Signed)
SCRIPT SENT PT AWARE

## 2023-09-10 NOTE — Addendum Note (Signed)
 Addended by: Theresia Bough on: 09/10/2023 03:17 PM   Modules accepted: Orders

## 2023-09-26 NOTE — Progress Notes (Incomplete)
 PCP: Dr Coralee North [facility] Cardiology: Dr Shirlee Latch   HPI: Ms Weide is a 53 y.o. with history of HTN, COPD/smoking 1.5-2 ppd until about 2/23, GERD, and cirrhosis noted since 2016 by imaging though patient unaware until 2/23.    Admitted 08/05/21 with A/C HFpEF. Diuresed with IV lasix. Echo showed EF 60-65%, moderately dilated and moderately dysfunctional RV, D-shaped septum, PASP 59 mmHg, and severe RAE. Abdominal US showed cirrhosis with minimal ascites and splenomegaly was noted by CT.  CT chest showed splenomegaly with large right effusion, right paratracheal node enlarged, right-sided atelectasis, and scattered infiltration RUL.  She had a right thoracentesis with 1.3 L off, this fluid grew MAC.  HCV/HBV/HAV serologies negative.  Had RHC with  severe pulmonary hypertension, high cardiac output, and PVR 3.7. Discharge weight 181 pounds. She was sent home on oxygen.    Developed intractable cough on Tyvaso and stopped it.  She started on Uptravi.   She had a liver biopsy that showed nonspecific findings.   Echo 3/24 EF 60-65%, mod LVH, mod diastolic dysfunction, mild RV enlargement with normal RV systolic function, unable to estimate PA systolic pressure, IVC normal.   RHC 4/24 with filling pressures not significantly elevated. Mild pulmonary HTN, much improved from prior study.   *** Today she returns for pulmonary HTN/ RV failure follow up. Overall feeling ***. Denies palpitations, CP, dizziness, edema, or PND/Orthopnea. *** SOB. Appetite ok. No fever or chills. Weight at home *** pounds. Taking all medications. Denies ETOH, tobacco or drug use.   ECG (personally reviewed): NSR, normal  6 minute walk (4/23): 274 m 6 minute walk (5/23): 366 m 6 minute walk (9/23): 289 m (Off Tyvaso) 6 minute walk (3/24): 396 m  Labs (2/23): anti-SCL 70 negative, RF > 650, anti-centromere Ab negative, HIV negative  PMH: 1. COPD: smoked 1.5-2 ppd x years, stopped 2/23.  CT chest with emphysema.  2. 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 PVCs 4. H/o cholecystectomy 5. Cirrhosis: Suspect NAFLD.  Never a heavy drinker.  Viral hepatitis workup negative.  - Splenomegaly - Liver biopsy unremarkable.  6. Psoriasis 7. Pulmonary HTN/RV failure: Echo (2/23) with EF 60-65%, D-shaped septum, moderate RV enlargement with moderately decreased RV systolic function, PASP 59 mmHg.   - V/Q scan (2/23): No evidence for chronic PE.  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  - Echo (3/24): EF 60-65%, moderate LVH, moderate diastolic dysfunction, mild RV enlargement with normal RV systolic function, unable to estimate PA systolic pressure, IVC normal.  - RHC 4/24 with filling pressures not significantly elevated. Mild pulmonary HTN, much improved from prior study.   ROS: All systems negative except as listed in HPI, PMH and Problem List.  SH:  Social History   Socioeconomic History   Marital status: Married    Spouse name: Alania Overholt   Number of children: 1   Years of education: Not on file   Highest education level: Some college, no degree  Occupational History   Occupation: unemployed   Occupation: disabled  Tobacco Use   Smoking status: Former    Current packs/day: 0.00    Average packs/day: 1.5 packs/day for 30.0 years (45.0 ttl pk-yrs)    Types: Cigarettes    Start date: 08/06/1991    Quit date: 08/05/2021    Years since quitting: 2.1  Smokeless tobacco: Never  Vaping Use   Vaping status: Never Used  Substance and Sexual Activity   Alcohol use: No    Alcohol/week: 0.0 standard drinks of alcohol   Drug use: No   Sexual activity: Not on file  Other Topics Concern   Not on file  Social History Narrative   Not on file   Social Drivers of Health   Financial Resource Strain: High Risk (08/07/2021)   Overall Financial  Resource Strain (CARDIA)    Difficulty of Paying Living Expenses: Hard  Food Insecurity: No Food Insecurity (08/07/2021)   Hunger Vital Sign    Worried About Running Out of Food in the Last Year: Never true    Ran Out of Food in the Last Year: Never true  Transportation Needs: No Transportation Needs (08/07/2021)   PRAPARE - Administrator, Civil Service (Medical): No    Lack of Transportation (Non-Medical): No  Physical Activity: Not on file  Stress: Not on file  Social Connections: Not on file  Intimate Partner Violence: Not on file   FH:  Family History  Problem Relation Age of Onset   Diabetes Mother    Heart disease Mother    Hyperlipidemia Mother    Diabetes Father    Cancer Father    Heart disease Father    Hyperlipidemia Father    Hypertension Father    Heart disease Maternal Grandmother    Hyperlipidemia Maternal Grandmother    Hypertension Maternal Grandmother    Heart disease Maternal Grandfather    Hyperlipidemia Maternal Grandfather    Hypertension Maternal Grandfather    Heart disease Paternal Grandmother    Hyperlipidemia Paternal Grandmother    Hypertension Paternal Grandmother    Stroke Paternal Grandmother    Diabetes Paternal Grandmother    Cancer Paternal Grandmother    Breast cancer Neg Hx    Current Outpatient Medications  Medication Sig Dispense Refill   acetaminophen (TYLENOL) 500 MG tablet Take 500-1,000 mg by mouth every 6 (six) hours as needed for mild pain or headache.     amitriptyline (ELAVIL) 25 MG tablet Take 25 mg by mouth at bedtime.     Carboxymethylcellul-Glycerin (LUBRICATING EYE DROPS OP) Place 1 drop into both eyes daily as needed (irritation). Blink-N-Clear     clonazePAM (KLONOPIN) 0.5 MG tablet Take 0.5 mg by mouth in the morning.     furosemide (LASIX) 40 MG tablet TAKE 1 TABLET BY MOUTH IN THE MORNING 1/2 (ONE-HALF) IN THE EVENING . 135 tablet 0   hydrocortisone cream 1 % Apply 1 Application topically 2 (two) times  daily as needed for itching.     loperamide (IMODIUM A-D) 2 MG tablet Take 2 mg by mouth as needed for diarrhea or loose stools.     macitentan (OPSUMIT) 10 MG tablet Take 1 tablet (10 mg total) by mouth in the morning. 30 tablet 11   metoprolol succinate (TOPROL-XL) 50 MG 24 hr tablet Take 1 tablet (50 mg total) by mouth daily. 30 tablet 6   omeprazole (PRILOSEC) 20 MG capsule Take 20 mg by mouth daily before breakfast.     Potassium Chloride ER 20 MEQ TBCR Take 40 mEq by mouth in the morning.     pravastatin (PRAVACHOL) 20 MG tablet Take 20 mg by mouth in the morning.     Selexipag 1600 MCG TABS Take 1,600 mcg by mouth in the morning and at bedtime.     sertraline (ZOLOFT) 100 MG tablet Take 150 mg by  mouth daily.     spironolactone (ALDACTONE) 25 MG tablet Take 1 tablet by mouth once daily 90 tablet 3   Vitamin D, Ergocalciferol, (DRISDOL) 1.25 MG (50000 UNIT) CAPS capsule Take 1 capsule (50,000 Units total) by mouth every 7 (seven) days. 16 capsule 0   Zinc 50 MG TABS Take 50 mg by mouth in the morning.     No current facility-administered medications for this visit.   LMP 06/24/2017 (Approximate)   Wt Readings from Last 3 Encounters:  10/18/22 86.2 kg (190 lb)  09/12/22 87.6 kg (193 lb 3.2 oz)  06/11/22 83.2 kg (183 lb 6.4 oz)    PHYSICAL EXAM: General:  *** appearing.  No respiratory difficulty HEENT: normal Neck: supple. JVD *** cm.  Cor: PMI nondisplaced. Regular rate & rhythm. No rubs, gallops or murmurs. Lungs: clear Abdomen: soft, nontender, nondistended. Good bowel sounds. Extremities: no cyanosis, clubbing, rash, edema  Neuro: alert & oriented x 3. Moves all 4 extremities w/o difficulty. Affect pleasant.   ASSESSMENT & PLAN: 1. Pulmonary hypertension/Chronic diastolic CHF with RV failure:  Echo 2/23: EF 60-65%, mod dilated and mod dysfunctional RV, D-shaped septum, PASP 59 mmHg, and severe RAE (no prior).  RHC after diuresis showed borderline elevated PCWP with severe  pulmonary hypertension and elevated RA pressure.  Cause of pulmonary hypertension/RV failure is uncertain.  PVR was elevated but not markedly so at 3.7 WU due to high cardiac output that may be related to cirrhosis.  V/Q scan not suggestive of chronic PE.  Serologic workup was negative except for significantly elevated RF (normal CCP antibody).  It is certainly possible that the patient could have portopulmonary hypertension (form of group 1 PH) in setting of cirrhosis. She was a heavy smoker with COPD, suspect component of group 3 PH. However, CT chest did not seem to have extensive lung parenchymal disease => she was seen by pulmonary, thought to have emphysema on chest CT. PFTs showed moderate-severe obstruction/moderate restriction.  Echo 3/24: mild RV enlargement with normal RV function, unable to estimate PASP. RHC 4/24 with filling pressures not significantly elevated. Mild pulmonary HTN, much improved from prior study.  - She is not volume overloaded on exam, NYHA class II.  She is on 3 L [place] chronically. She did not tolerate Tyvaso due to cough. 6 minute walk improved. *** - Continue Uptravi, increase gradually.  She is up to 1400 mcg bid, will increase to 1600 mcg bid soon.   - Continue Opsumit 10 mg daily.  - Continue Lasix 40 mg daily, BMET/BNP today. . - Continue spironolactone 25 mg daily.  - Will need to reschedule sleep study. *** - Continue home oxygen. Goal O2 sat > 90%, per pulm. - Anti-CCP Ab was negative. With marked RF elevation, would like her to see rheumatology, she cancelled her new patient appointment. She has been referred back to Rheumatology and has appointment again.   2. Cirrhosis: Cause is uncertain.  Noted by imaging back in 2016.  She has evidence for both cirrhosis and splenomegaly on imaging.  Tbili elevated, albumin low.  Viral hepatitis serologies were negative.  She denies ETOH use. ?NAFLD with progression to cirrhosis, also could be related to RV failure (though  there was concern cirrhosis actually came first and led to portopulmonary hypertension). Interestingly, liver biopsy with relatively unremarkable.  - Continue followup with GI.   3. 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  Fluid grew MAC, followed by ID. Not currently requiring treatment.  4. Chronic hypoxemic  respiratory failure: In setting of COPD as well as RV failure/pulmonary hypertension. She remains on 3L oxygen. - Followup with pulmonary.   Followup in 3 months.   Alen Bleacher 09/26/23

## 2023-10-02 ENCOUNTER — Encounter (HOSPITAL_COMMUNITY): Payer: Self-pay

## 2023-10-02 ENCOUNTER — Ambulatory Visit (HOSPITAL_COMMUNITY)
Admission: RE | Admit: 2023-10-02 | Discharge: 2023-10-02 | Disposition: A | Discharge status: Home / Self Care | Payer: Self-pay | Source: Ambulatory Visit | Attending: Internal Medicine | Admitting: Internal Medicine

## 2023-10-02 VITALS — BP 146/66 | HR 78 | Wt 205.6 lb

## 2023-10-02 DIAGNOSIS — I5032 Chronic diastolic (congestive) heart failure: Secondary | ICD-10-CM | POA: Insufficient documentation

## 2023-10-02 DIAGNOSIS — K7460 Unspecified cirrhosis of liver: Secondary | ICD-10-CM | POA: Insufficient documentation

## 2023-10-02 DIAGNOSIS — Z5986 Financial insecurity: Secondary | ICD-10-CM | POA: Insufficient documentation

## 2023-10-02 DIAGNOSIS — K7469 Other cirrhosis of liver: Secondary | ICD-10-CM

## 2023-10-02 DIAGNOSIS — I110 Hypertensive heart disease with heart failure: Secondary | ICD-10-CM | POA: Insufficient documentation

## 2023-10-02 DIAGNOSIS — E66812 Obesity, class 2: Secondary | ICD-10-CM

## 2023-10-02 DIAGNOSIS — J9611 Chronic respiratory failure with hypoxia: Secondary | ICD-10-CM | POA: Insufficient documentation

## 2023-10-02 DIAGNOSIS — I272 Pulmonary hypertension, unspecified: Secondary | ICD-10-CM | POA: Insufficient documentation

## 2023-10-02 DIAGNOSIS — Z6837 Body mass index (BMI) 37.0-37.9, adult: Secondary | ICD-10-CM | POA: Insufficient documentation

## 2023-10-02 DIAGNOSIS — E669 Obesity, unspecified: Secondary | ICD-10-CM | POA: Insufficient documentation

## 2023-10-02 DIAGNOSIS — Z8709 Personal history of other diseases of the respiratory system: Secondary | ICD-10-CM

## 2023-10-02 DIAGNOSIS — Z87891 Personal history of nicotine dependence: Secondary | ICD-10-CM | POA: Insufficient documentation

## 2023-10-02 DIAGNOSIS — Z79899 Other long term (current) drug therapy: Secondary | ICD-10-CM | POA: Insufficient documentation

## 2023-10-02 DIAGNOSIS — E6609 Other obesity due to excess calories: Secondary | ICD-10-CM

## 2023-10-02 DIAGNOSIS — J449 Chronic obstructive pulmonary disease, unspecified: Secondary | ICD-10-CM | POA: Insufficient documentation

## 2023-10-02 DIAGNOSIS — I509 Heart failure, unspecified: Secondary | ICD-10-CM

## 2023-10-02 LAB — BRAIN NATRIURETIC PEPTIDE: B Natriuretic Peptide: 63.4 pg/mL (ref 0.0–100.0)

## 2023-10-02 MED ORDER — FUROSEMIDE 40 MG PO TABS: 80.0000 mg | ORAL_TABLET | Freq: Every day | ORAL | 3 refills | Status: AC

## 2023-10-02 MED ORDER — METOPROLOL SUCCINATE ER 100 MG PO TB24: 100.0000 mg | ORAL_TABLET | Freq: Every day | ORAL | 3 refills | Status: AC

## 2023-10-02 NOTE — Patient Instructions (Addendum)
 Labs done today. We will contact you only if your labs are abnormal.  INCREASE Metoprolol to 100mg  (1 tablet) by mouth daily.  INCREASE Lasix to 80mg  (2  tablets) by mouth daily.   No other medication changes were made. Please continue all current medications as prescribed.  Your physician recommends that you schedule a follow-up appointment in: 3 months with Dr. Shirlee Latch. Please contact our office in May 2025 to schedule a July 2025 appointment.   If you have any questions or concerns before your next appointment please send [place] a message through [place] or call our office at [phone].    TO LEAVE A MESSAGE FOR THE NURSE SELECT OPTION 2, PLEASE LEAVE A MESSAGE INCLUDING: YOUR NAME DATE OF BIRTH CALL BACK NUMBER REASON FOR CALL**this is important as we prioritize the call backs  YOU WILL RECEIVE A CALL BACK THE SAME DAY AS LONG AS YOU CALL BEFORE 4:00 PM   Do the following things EVERYDAY: Weigh yourself in the morning before breakfast. Write it down and keep it in a log. Take your medicines as prescribed Eat low salt foods--Limit salt (sodium) to 2000 mg per day.  Stay as active as you can everyday Limit all fluids for the day to less than 2 liters   At [facility], you and your health needs are our priority. As part of our continuing mission to provide you with exceptional heart care, we have created designated Provider Care Teams. These Care Teams include your primary Cardiologist (physician) and Advanced Practice Providers (APPs- Physician Assistants and Nurse Practitioners) who all work together to provide you with the care you need, when you need it.   You may see any of the following providers on your designated Care Team at your next follow up: Dr Arvilla Meres Dr Marca Ancona Dr. Marcos Eke, NP Robbie Lis, [place] [facility] [place], [place] Brynda Peon, NP Karle Plumber, PharmD   Please be sure to bring in all your  medications bottles to every appointment.    Thank you for choosing [facility]

## 2023-10-02 NOTE — Progress Notes (Signed)
 ReDS Vest / Clip - 10/02/23 1200       ReDS Vest / Clip   Station Marker B    Ruler Value 31.5    ReDS Value Range Low volume    ReDS Actual Value 30

## 2023-10-21 ENCOUNTER — Other Ambulatory Visit (HOSPITAL_COMMUNITY): Payer: Self-pay | Admitting: Physician Assistant

## 2023-10-29 ENCOUNTER — Other Ambulatory Visit (HOSPITAL_COMMUNITY): Payer: Self-pay | Admitting: Cardiology

## 2023-12-05 ENCOUNTER — Other Ambulatory Visit (HOSPITAL_COMMUNITY): Payer: Self-pay | Admitting: Cardiology

## 2023-12-10 ENCOUNTER — Encounter: Payer: Self-pay | Admitting: Emergency Medicine

## 2023-12-10 ENCOUNTER — Ambulatory Visit
Admission: EM | Admit: 2023-12-10 | Discharge: 2023-12-10 | Disposition: A | Discharge status: Home / Self Care | Payer: Self-pay | Attending: Nurse Practitioner | Admitting: Nurse Practitioner

## 2023-12-10 ENCOUNTER — Telehealth: Payer: Self-pay | Admitting: Nurse Practitioner

## 2023-12-10 DIAGNOSIS — L02411 Cutaneous abscess of right axilla: Secondary | ICD-10-CM

## 2023-12-10 DIAGNOSIS — L03114 Cellulitis of left upper limb: Secondary | ICD-10-CM

## 2023-12-10 MED ORDER — CEFTRIAXONE SODIUM 1 G IJ SOLR
1.0000 g | Freq: Once | INTRAMUSCULAR | Status: AC
  Administered 2023-12-10: 1 g via INTRAMUSCULAR

## 2023-12-10 MED ORDER — SULFAMETHOXAZOLE-TRIMETHOPRIM 800-160 MG PO TABS: 1.0000 | ORAL_TABLET | Freq: Two times a day (BID) | ORAL | 0 refills | Status: DC

## 2023-12-10 MED ORDER — CEPHALEXIN 500 MG PO CAPS: 500.0000 mg | ORAL_CAPSULE | Freq: Three times a day (TID) | ORAL | 0 refills | Status: DC

## 2023-12-10 NOTE — ED Triage Notes (Signed)
 Pt reports abscess on underside of R arm into axillary area x5 days. Notes she has being taking a friend's leftover amoxicillin for 3 days with no changes. Area is painful with erythema around site. Gradually getting bigger. Hx of hidradenitis suppurativa

## 2023-12-10 NOTE — Discharge Instructions (Addendum)
 You were treated today for an abscess, which is an infection beneath the skin that can become serious if not properly managed. A procedure called incision and drainage (I\&D) was performed to release the built-up pus and reduce pressure. The area was cleaned and packed with gauze to support healing, and this packing should remain in place until your follow-up. A sample of the drainage was sent to the lab to identify the type of bacteria causing the infection. You were prescribed antibiotics--take them exactly as directed and always with food to minimize stomach upset. Continue applying warm, moist compresses to the area several times a day to help reduce pain and promote drainage. Monitor the area closely for signs of worsening infection, including increased redness, swelling, pain, drainage, or fever. Cellulitis, a skin infection caused by bacteria, was also noted to be present and can spread if not treated appropriately. The infected area was outlined today to help you track changes. If redness spreads beyond the marked area, you develop red streaks, fever, chills, or experience nausea or vomiting, seek medical attention immediately. You may use Tylenol  or ibuprofen to help relieve discomfort. You should return here in 24 hours for reassessment and to check the healing progress.

## 2023-12-10 NOTE — ED Provider Notes (Signed)
 [facility]    CSN: [id number] Arrival date & time: 12/10/23  1626      History   Chief Complaint Chief Complaint  Patient presents with   Pamela Ho    HPI [name] is a 53 y.o. female.   [name] is a 53 y.o. female that presents with an Pamela Ho within her right axilla that has been present for 5 days. She has been self-medicating with leftover amoxicillin, taking it twice a day for the past 3 days. The patient reports that the Pamela Ho is not draining. She applied hot, wet compresses to the area this morning, which has reduced the pain but she now feels pressure and tightness in the affected area. She describes the sensation as feeling so full. She denies any fever but mentions significant pain prior to applying the compresses.  Patient reports some improvement in her ability to move her arm. She has not experienced any systemic symptoms such as fever, fatigue or palpitations.   The following portions of the patient's history were reviewed and updated as appropriate: allergies, current medications, past family history, past medical history, past social history, past surgical history, and problem list     Past Medical History:  Diagnosis Date   Acute congestive heart failure (HCC) 08/05/2021   Anemia    Anxiety    COPD (chronic obstructive pulmonary disease) (HCC)    Depression    GERD (gastroesophageal reflux disease)    Heart murmur    Hypertension     Patient Active Problem List   Diagnosis Date Noted   Rheumatism 10/09/2021   Heart failure, type unknown (HCC) 08/30/2021   Thrombocytopenia (HCC) 08/10/2021   Adrenal mass (HCC) 08/09/2021   Pleural effusion on right 08/09/2021   Acute respiratory failure with hypoxia (HCC) 08/07/2021   AKI (acute kidney injury) (HCC) 08/07/2021   Glucose intolerance 08/07/2021   Cirrhosis of liver (HCC) 08/06/2021   Generalized anxiety disorder 08/06/2021   GERD without esophagitis 08/06/2021    Hypokalemia 08/06/2021   Chronic diastolic heart failure (HCC) 08/06/2021   Elevated troponin level not due myocardial infarction 08/05/2021   Nicotine dependence, cigarettes, uncomplicated 08/05/2021   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   Hypertensive urgency 02/08/2015   Headache 02/08/2015    Past Surgical History:  Procedure Laterality Date   CHOLECYSTECTOMY     cyst from neck     IR THORACENTESIS ASP PLEURAL SPACE W/IMG GUIDE  08/09/2021   RIGHT HEART CATH N/A 08/10/2021   Procedure: RIGHT HEART CATH;  Surgeon: Darlis Eisenmenger, MD;  Location: [facility] INVASIVE CV LAB;  Service: Cardiovascular;  Laterality: N/A;   RIGHT HEART CATH N/A 10/18/2022   Procedure: RIGHT HEART CATH;  Surgeon: Darlis Eisenmenger, MD;  Location: [facility] INVASIVE CV LAB;  Service: Cardiovascular;  Laterality: N/A;    OB History   No obstetric history on file.      Home Medications    Prior to Admission medications   Medication Sig Start Date End Date Taking? Authorizing Provider  acetaminophen  (TYLENOL ) 500 MG tablet Take 500-1,000 mg by mouth every 6 (six) hours as needed for mild pain or headache.   Yes [provider]  cephALEXin (KEFLEX) 500 MG capsule Take 1 capsule (500 mg total) by mouth 3 (three) times daily for 7 days. 12/10/23 12/17/23 Yes Maryruth Sol, FNP  clonazePAM  (KLONOPIN ) 0.5 MG tablet Take 0.5 mg by mouth in the morning.   Yes [provider]  furosemide  (LASIX ) 40 MG tablet Take  2 tablets (80 mg total) by mouth daily. 10/02/23  Yes Sheryl Donna, NP  macitentan  (OPSUMIT ) 10 MG tablet Take 1 tablet (10 mg total) by mouth in the morning. 05/19/23  Yes Darlis Eisenmenger, MD  metoprolol  succinate (TOPROL -XL) 100 MG 24 hr tablet Take 1 tablet (100 mg total) by mouth daily. 10/02/23  Yes Sheryl Donna, NP  omeprazole (PRILOSEC) 20 MG capsule Take 20 mg by mouth daily before breakfast.   Yes [provider]  Potassium Chloride  ER 20 MEQ TBCR Take 2 tablets by mouth once  daily 12/08/23  Yes McLean, Dalton S, MD  pravastatin  (PRAVACHOL ) 20 MG tablet Take 20 mg by mouth in the morning.   Yes [provider]  Selexipag 1600 MCG TABS Take 1,600 mcg by mouth in the morning and at bedtime. 04/29/22  Yes [provider]  sertraline  (ZOLOFT ) 100 MG tablet Take 150 mg by mouth daily.   Yes [provider]  spironolactone  (ALDACTONE ) 25 MG tablet Take 1 tablet by mouth once daily 10/21/23  Yes Dennise Fitz L, NP  sulfamethoxazole-trimethoprim (BACTRIM DS) 800-160 MG tablet Take 1 tablet by mouth 2 (two) times daily for 7 days. 12/10/23 12/17/23 Yes Maryruth Sol, FNP  Carboxymethylcellul-Glycerin (LUBRICATING EYE DROPS OP) Place 1 drop into both eyes daily as needed (irritation). Blink-N-Clear Patient not taking: Reported on 12/10/2023    [provider]  hydrocortisone cream 1 % Apply 1 Application topically 2 (two) times daily as needed for itching. Patient not taking: Reported on 12/10/2023    [provider]  loperamide (IMODIUM A-D) 2 MG tablet Take 2 mg by mouth as needed for diarrhea or loose stools. Patient not taking: Reported on 12/10/2023    [provider]  Vitamin D , Ergocalciferol , (DRISDOL ) 1.25 MG (50000 UNIT) CAPS capsule Take 1 capsule (50,000 Units total) by mouth every 7 (seven) days. 01/14/23   Elois Hair, MD    Family History Family History  Problem Relation Age of Onset   Diabetes Mother    Heart disease Mother    Hyperlipidemia Mother    Diabetes Father    Cancer Father    Heart disease Father    Hyperlipidemia Father    Hypertension Father    Heart disease Maternal Grandmother    Hyperlipidemia Maternal Grandmother    Hypertension Maternal Grandmother    Heart disease Maternal Grandfather    Hyperlipidemia Maternal Grandfather    Hypertension Maternal Grandfather    Heart disease Paternal Grandmother    Hyperlipidemia Paternal Grandmother    Hypertension Paternal Grandmother     Stroke Paternal Grandmother    Diabetes Paternal Grandmother    Cancer Paternal Grandmother    Breast cancer Neg Hx     Social History Social History   Tobacco Use   Smoking status: Former    Current packs/day: 0.00    Average packs/day: 1.5 packs/day for 30.0 years (45.0 ttl pk-yrs)    Types: Cigarettes    Start date: 08/06/1991    Quit date: 08/05/2021    Years since quitting: 2.3    Passive exposure: Past   Smokeless tobacco: Never  Vaping Use   Vaping status: Never Used  Substance Use Topics   Alcohol use: No    Alcohol/week: 0.0 standard drinks of alcohol   Drug use: No     Allergies   Tyvaso [treprostinil], Lexapro [escitalopram], and Lisinopril    Review of Systems Review of Systems  Constitutional:  Negative for chills, diaphoresis, fatigue and  fever.  Respiratory:  Negative for shortness of breath.   Cardiovascular:  Negative for chest pain and palpitations.  Gastrointestinal:  Negative for nausea and vomiting.  Skin:  Positive for wound.  All other systems reviewed and are negative.    Physical Exam Triage Vital Signs ED Triage Vitals  Encounter Vitals Group     BP 12/10/23 1648 114/73     Girls Systolic BP Percentile --      Girls Diastolic BP Percentile --      Boys Systolic BP Percentile --      Boys Diastolic BP Percentile --      Pulse Rate 12/10/23 1648 80     Resp 12/10/23 1648 18     Temp 12/10/23 1648 98 F (36.7 C)     Temp Source 12/10/23 1648 Oral     SpO2 12/10/23 1648 95 %     Weight --      Height --      Head Circumference --      Peak Flow --      Pain Score 12/10/23 1649 2     Pain Loc --      Pain Education --      Exclude from Growth Chart --    No data found.  Updated Vital Signs BP 114/73 (BP Location: Left Arm)   Pulse 80   Temp 98 F (36.7 C) (Oral)   Resp 18   LMP 06/24/2017 (Approximate)   SpO2 95%   Visual Acuity Right Eye Distance:   Left Eye Distance:   Bilateral Distance:    Right Eye Near:   Left  Eye Near:    Bilateral Near:     Physical Exam Vitals reviewed.  Constitutional:      General: She is not in acute distress.    Appearance: Normal appearance. She is not toxic-appearing.  HENT:     Head: Normocephalic.     Mouth/Throat:     Mouth: Mucous membranes are moist.   Eyes:     Conjunctiva/sclera: Conjunctivae normal.    Cardiovascular:     Rate and Rhythm: Normal rate and regular rhythm.     Heart sounds: Normal heart sounds.  Pulmonary:     Effort: Pulmonary effort is normal.     Breath sounds: Normal breath sounds.   Musculoskeletal:        General: Normal range of motion.   Skin:    General: Skin is warm and dry.     Findings: Pamela Ho present.     Comments: Pamela Ho noted within the right axilla with underlying fluctuance. Large area of surrounding erythema extending down the arm also noted and marked (see photos below).    Neurological:     General: No focal deficit present.     Mental Status: She is alert and oriented to person, place, and time.         UC Treatments / Results  Labs (all labs ordered are listed, but only abnormal results are displayed) Labs Reviewed  CBC WITH DIFFERENTIAL/PLATELET    EKG   Radiology No results found.  Procedures Incision and Drainage  Date/Time: 12/10/2023 6:33 PM  Performed by: Maryruth Sol, FNP Authorized by: Maryruth Sol, FNP   Consent:    Consent obtained:  Verbal   Consent given by:  Patient   Risks, benefits, and alternatives were discussed: yes     Risks discussed:  Bleeding, incomplete drainage and pain Universal protocol:    Patient identity confirmed:  Verbally  with patient and arm band Location:    Type:  Pamela Ho   Location:  Upper extremity   Upper extremity location: right axilla. Pre-procedure details:    Skin preparation:  Chlorhexidine with alcohol and povidone-iodine Sedation:    Sedation type:  None Anesthesia:    Anesthesia method:  Local infiltration   Local  anesthetic:  Lidocaine  1% w/o epi Procedure type:    Complexity:  Complex Procedure details:    Incision types:  Single straight   Wound management:  Probed and deloculated   Drainage:  Purulent   Drainage amount:  Moderate   Packing materials:  1/4 in iodoform gauze Post-procedure details:    Procedure completion:  Tolerated well, no immediate complications  (including critical care time)  Medications Ordered in UC Medications  cefTRIAXone (ROCEPHIN) injection 1 g (1 g Intramuscular Given 12/10/23 1747)    Initial Impression / Assessment and Plan / UC Course  I have reviewed the triage vital signs and the nursing notes.  Pertinent labs & imaging results that were available during my care of the patient were reviewed by me and considered in my medical decision making (see chart for details).    The patient presents with a 5-day history of an Pamela Ho within the right axilla. The patient has been self-treating with leftover amoxicillin for the past three days and applied hot compresses this morning, which reduced pain but left a persistent pressure sensation. Examination reveals significant localized infection with visible erythema tracking down the arm. The patient denies fever or systemic symptoms, and vital signs are stable. An incision and drainage procedure was performed in clinic. Rocephin injection was administered, and oral antibiotics to cover MRSA, strep and MSSA were prescribed. A line was drawn around the area of redness to monitor for progression, and a photograph was uploaded to the chart for documentation. The patient was educated on signs of worsening infection or sepsis, including increased redness, swelling, fever, or malaise, and was advised to return for a follow-up evaluation in 24 hours. The patient should seek immediate emergency care if systemic symptoms develop, including fever, chills, rapid heart rate, confusion, or spreading redness beyond the marked area.  Today's  evaluation has revealed no signs of a dangerous process. Discussed diagnosis with patient and/or guardian. Patient and/or guardian aware of their diagnosis, possible red flag symptoms to watch out for and need for close follow up. Patient and/or guardian understands verbal and written discharge instructions. Patient and/or guardian comfortable with plan and disposition.  Patient and/or guardian has a clear mental status at this time, good insight into illness (after discussion and teaching) and has clear judgment to make decisions regarding their care  Documentation was completed with the aid of voice recognition software. Transcription may contain typographical errors.  Final Clinical Impressions(s) / UC Diagnoses   Final diagnoses:  Cutaneous Pamela Ho of right axilla  Cellulitis of left upper extremity     Discharge Instructions      You were treated today for an Pamela Ho, which is an infection beneath the skin that can become serious if not properly managed. A procedure called incision and drainage (I\&D) was performed to release the built-up pus and reduce pressure. The area was cleaned and packed with gauze to support healing, and this packing should remain in place until your follow-up. A sample of the drainage was sent to the lab to identify the type of bacteria causing the infection. You were prescribed antibiotics--take them exactly as directed and always with food to  minimize stomach upset. Continue applying warm, moist compresses to the area several times a day to help reduce pain and promote drainage. Monitor the area closely for signs of worsening infection, including increased redness, swelling, pain, drainage, or fever. Cellulitis, a skin infection caused by bacteria, was also noted to be present and can spread if not treated appropriately. The infected area was outlined today to help you track changes. If redness spreads beyond the marked area, you develop red streaks, fever, chills, or  experience nausea or vomiting, seek medical attention immediately. You may use Tylenol  or ibuprofen to help relieve discomfort. You should return here in 24 hours for reassessment and to check the healing progress.      ED Prescriptions     Medication Sig Dispense Auth. Provider   sulfamethoxazole-trimethoprim (BACTRIM DS) 800-160 MG tablet Take 1 tablet by mouth 2 (two) times daily for 7 days. 14 tablet Bryttney Netzer, [place], FNP   cephALEXin (KEFLEX) 500 MG capsule Take 1 capsule (500 mg total) by mouth 3 (three) times daily for 7 days. 21 capsule Maryruth Sol, FNP      PDMP not reviewed this encounter.   Maryruth Sol, [place] 12/10/23 [phone]

## 2023-12-11 ENCOUNTER — Telehealth: Payer: Self-pay | Admitting: *Deleted

## 2023-12-11 ENCOUNTER — Encounter (HOSPITAL_COMMUNITY): Payer: Self-pay

## 2023-12-11 ENCOUNTER — Ambulatory Visit
Admission: EM | Admit: 2023-12-11 | Discharge: 2023-12-11 | Disposition: A | Discharge status: Home / Self Care | Payer: Self-pay | Attending: Family Medicine | Admitting: Family Medicine

## 2023-12-11 ENCOUNTER — Inpatient Hospital Stay (HOSPITAL_COMMUNITY)
Admission: EM | Admit: 2023-12-11 | Discharge: 2023-12-13 | DRG: 603 | Disposition: A | Discharge status: Home / Self Care | Payer: MEDICAID | Attending: Family Medicine | Admitting: Family Medicine

## 2023-12-11 ENCOUNTER — Ambulatory Visit (HOSPITAL_COMMUNITY): Payer: Self-pay

## 2023-12-11 VITALS — BP 124/76 | HR 85 | Temp 98.8°F | Resp 20

## 2023-12-11 DIAGNOSIS — J449 Chronic obstructive pulmonary disease, unspecified: Secondary | ICD-10-CM | POA: Diagnosis present

## 2023-12-11 DIAGNOSIS — Z888 Allergy status to other drugs, medicaments and biological substances status: Secondary | ICD-10-CM

## 2023-12-11 DIAGNOSIS — K7460 Unspecified cirrhosis of liver: Secondary | ICD-10-CM | POA: Diagnosis present

## 2023-12-11 DIAGNOSIS — D696 Thrombocytopenia, unspecified: Secondary | ICD-10-CM | POA: Diagnosis present

## 2023-12-11 DIAGNOSIS — L03111 Cellulitis of right axilla: Principal | ICD-10-CM | POA: Diagnosis present

## 2023-12-11 DIAGNOSIS — Z8249 Family history of ischemic heart disease and other diseases of the circulatory system: Secondary | ICD-10-CM | POA: Diagnosis not present

## 2023-12-11 DIAGNOSIS — Z87891 Personal history of nicotine dependence: Secondary | ICD-10-CM | POA: Diagnosis not present

## 2023-12-11 DIAGNOSIS — L0291 Cutaneous abscess, unspecified: Secondary | ICD-10-CM | POA: Diagnosis present

## 2023-12-11 DIAGNOSIS — Z823 Family history of stroke: Secondary | ICD-10-CM | POA: Diagnosis not present

## 2023-12-11 DIAGNOSIS — Z9981 Dependence on supplemental oxygen: Secondary | ICD-10-CM | POA: Diagnosis not present

## 2023-12-11 DIAGNOSIS — D649 Anemia, unspecified: Secondary | ICD-10-CM | POA: Diagnosis present

## 2023-12-11 DIAGNOSIS — E876 Hypokalemia: Secondary | ICD-10-CM | POA: Diagnosis present

## 2023-12-11 DIAGNOSIS — Z79899 Other long term (current) drug therapy: Secondary | ICD-10-CM

## 2023-12-11 DIAGNOSIS — L270 Generalized skin eruption due to drugs and medicaments taken internally: Secondary | ICD-10-CM | POA: Diagnosis present

## 2023-12-11 DIAGNOSIS — F32A Depression, unspecified: Secondary | ICD-10-CM | POA: Diagnosis present

## 2023-12-11 DIAGNOSIS — K219 Gastro-esophageal reflux disease without esophagitis: Secondary | ICD-10-CM | POA: Diagnosis present

## 2023-12-11 DIAGNOSIS — T368X5A Adverse effect of other systemic antibiotics, initial encounter: Secondary | ICD-10-CM | POA: Diagnosis present

## 2023-12-11 DIAGNOSIS — I110 Hypertensive heart disease with heart failure: Secondary | ICD-10-CM | POA: Diagnosis present

## 2023-12-11 DIAGNOSIS — L02411 Cutaneous abscess of right axilla: Secondary | ICD-10-CM | POA: Diagnosis present

## 2023-12-11 DIAGNOSIS — D72819 Decreased white blood cell count, unspecified: Secondary | ICD-10-CM | POA: Diagnosis present

## 2023-12-11 DIAGNOSIS — L03113 Cellulitis of right upper limb: Secondary | ICD-10-CM

## 2023-12-11 DIAGNOSIS — I5032 Chronic diastolic (congestive) heart failure: Secondary | ICD-10-CM | POA: Diagnosis present

## 2023-12-11 DIAGNOSIS — Z833 Family history of diabetes mellitus: Secondary | ICD-10-CM

## 2023-12-11 DIAGNOSIS — L732 Hidradenitis suppurativa: Secondary | ICD-10-CM | POA: Diagnosis present

## 2023-12-11 DIAGNOSIS — F419 Anxiety disorder, unspecified: Secondary | ICD-10-CM | POA: Diagnosis present

## 2023-12-11 DIAGNOSIS — Z83438 Family history of other disorder of lipoprotein metabolism and other lipidemia: Secondary | ICD-10-CM | POA: Diagnosis not present

## 2023-12-11 DIAGNOSIS — I272 Pulmonary hypertension, unspecified: Secondary | ICD-10-CM | POA: Diagnosis present

## 2023-12-11 DIAGNOSIS — Z881 Allergy status to other antibiotic agents status: Secondary | ICD-10-CM

## 2023-12-11 LAB — CBC WITH DIFFERENTIAL/PLATELET
Abs Immature Granulocytes: 0.02 10*3/uL (ref 0.00–0.07)
Basophils Absolute: 0 10*3/uL (ref 0.0–0.2)
Basophils Absolute: 0 10*3/uL (ref 0.0–0.1)
Basophils Relative: 0 %
Basos: 0 %
EOS (ABSOLUTE): 0.1 10*3/uL (ref 0.0–0.4)
Eos: 2 %
Eosinophils Absolute: 0.1 10*3/uL (ref 0.0–0.5)
Eosinophils Relative: 3 %
HCT: 41.6 % (ref 36.0–46.0)
Hematocrit: 42.7 % (ref 34.0–46.6)
Hemoglobin: 11.9 g/dL (ref 11.1–15.9)
Hemoglobin: 11.6 g/dL — ABNORMAL LOW (ref 12.0–15.0)
Immature Grans (Abs): 0 10*3/uL (ref 0.0–0.1)
Immature Granulocytes: 0 %
Immature Granulocytes: 1 %
Lymphocytes Absolute: 0.6 10*3/uL — ABNORMAL LOW (ref 0.7–3.1)
Lymphocytes Relative: 16 %
Lymphs Abs: 0.6 10*3/uL — ABNORMAL LOW (ref 0.7–4.0)
Lymphs: 17 %
MCH: 21.9 pg — ABNORMAL LOW (ref 26.6–33.0)
MCH: 21.6 pg — ABNORMAL LOW (ref 26.0–34.0)
MCHC: 27.9 g/dL — ABNORMAL LOW (ref 31.5–35.7)
MCHC: 27.9 g/dL — ABNORMAL LOW (ref 30.0–36.0)
MCV: 79 fL (ref 79–97)
MCV: 77.6 fL — ABNORMAL LOW (ref 80.0–100.0)
Monocytes Absolute: 0.2 10*3/uL (ref 0.1–0.9)
Monocytes Absolute: 0.2 10*3/uL (ref 0.1–1.0)
Monocytes Relative: 6 %
Monocytes: 6 %
Neutro Abs: 2.6 10*3/uL (ref 1.7–7.7)
Neutrophils Absolute: 2.8 10*3/uL (ref 1.4–7.0)
Neutrophils Relative %: 74 %
Neutrophils: 74 %
Platelets: 125 10*3/uL — ABNORMAL LOW (ref 150–450)
Platelets: 105 10*3/uL — ABNORMAL LOW (ref 150–400)
RBC: 5.43 x10E6/uL — ABNORMAL HIGH (ref 3.77–5.28)
RBC: 5.36 MIL/uL — ABNORMAL HIGH (ref 3.87–5.11)
RDW: 16.9 % — ABNORMAL HIGH (ref 11.7–15.4)
RDW: 18.6 % — ABNORMAL HIGH (ref 11.5–15.5)
WBC: 3.8 10*3/uL (ref 3.4–10.8)
WBC: 3.5 10*3/uL — ABNORMAL LOW (ref 4.0–10.5)
nRBC: 0 % (ref 0.0–0.2)

## 2023-12-11 LAB — COMPREHENSIVE METABOLIC PANEL WITH GFR
ALT: 18 U/L (ref 0–44)
AST: 16 U/L (ref 15–41)
Albumin: 3.6 g/dL (ref 3.5–5.0)
Alkaline Phosphatase: 93 U/L (ref 38–126)
Anion gap: 13 (ref 5–15)
BUN: 12 mg/dL (ref 6–20)
CO2: 27 mmol/L (ref 22–32)
Calcium: 9.6 mg/dL (ref 8.9–10.3)
Chloride: 101 mmol/L (ref 98–111)
Creatinine, Ser: 0.96 mg/dL (ref 0.44–1.00)
GFR, Estimated: >60 mL/min (ref >60)
Glucose, Bld: 160 mg/dL — ABNORMAL HIGH (ref 70–99)
Potassium: 4 mmol/L (ref 3.5–5.1)
Sodium: 141 mmol/L (ref 135–145)
Total Bilirubin: 0.6 mg/dL (ref 0.0–1.2)
Total Protein: 7.1 g/dL (ref 6.5–8.1)

## 2023-12-11 LAB — CBC
HCT: 41.3 % (ref 36.0–46.0)
Hemoglobin: 12 g/dL (ref 12.0–15.0)
MCH: 22.1 pg — ABNORMAL LOW (ref 26.0–34.0)
MCHC: 29.1 g/dL — ABNORMAL LOW (ref 30.0–36.0)
MCV: 76.1 fL — ABNORMAL LOW (ref 80.0–100.0)
Platelets: 108 10*3/uL — ABNORMAL LOW (ref 150–400)
RBC: 5.43 MIL/uL — ABNORMAL HIGH (ref 3.87–5.11)
RDW: 18.6 % — ABNORMAL HIGH (ref 11.5–15.5)
WBC: 3.3 10*3/uL — ABNORMAL LOW (ref 4.0–10.5)
nRBC: 0 % (ref 0.0–0.2)

## 2023-12-11 LAB — MRSA NEXT GEN BY PCR, NASAL: MRSA by PCR Next Gen: NOT DETECTED

## 2023-12-11 LAB — I-STAT CG4 LACTIC ACID, ED: Lactic Acid, Venous: 2.9 mmol/L (ref 0.5–1.9)

## 2023-12-11 LAB — CREATININE, SERUM
Creatinine, Ser: 1.05 mg/dL — ABNORMAL HIGH (ref 0.44–1.00)
GFR, Estimated: >60 mL/min (ref >60)

## 2023-12-11 MED ORDER — VANCOMYCIN HCL 2000 MG/400ML IV SOLN
2000.0000 mg | Freq: Once | INTRAVENOUS | Status: DC
  Administered 2023-12-12: 2000 mg via INTRAVENOUS
  Filled 2023-12-11: qty 400

## 2023-12-11 MED ORDER — SODIUM CHLORIDE 0.9 % IV BOLUS
500.0000 mL | Freq: Once | INTRAVENOUS | Status: AC
  Administered 2023-12-11: 500 mL via INTRAVENOUS

## 2023-12-11 MED ORDER — ACETAMINOPHEN 325 MG PO TABS: 650.0000 mg | ORAL_TABLET | Freq: Four times a day (QID) | ORAL | Status: DC | PRN

## 2023-12-11 MED ORDER — CLONAZEPAM 0.5 MG PO TABS
0.5000 mg | ORAL_TABLET | Freq: Once | ORAL | Status: AC
  Administered 2023-12-12: 0.5 mg via ORAL
  Filled 2023-12-11: qty 1

## 2023-12-11 MED ORDER — VANCOMYCIN HCL 1250 MG/250ML IV SOLN: 1250.0000 mg | INTRAVENOUS | Status: DC

## 2023-12-11 MED ORDER — ALBUTEROL SULFATE (2.5 MG/3ML) 0.083% IN NEBU: 2.5000 mg | INHALATION_SOLUTION | RESPIRATORY_TRACT | Status: DC | PRN

## 2023-12-11 MED ORDER — ONDANSETRON HCL 4 MG/2ML IJ SOLN
4.0000 mg | Freq: Four times a day (QID) | INTRAMUSCULAR | Status: DC | PRN
Start: 2023-12-11 — End: 2023-12-13

## 2023-12-11 MED ORDER — VANCOMYCIN HCL IN DEXTROSE 1-5 GM/200ML-% IV SOLN: 1000.0000 mg | Freq: Once | INTRAVENOUS | Status: DC

## 2023-12-11 MED ORDER — HEPARIN SODIUM (PORCINE) 5000 UNIT/ML IJ SOLN
5000.0000 [IU] | Freq: Three times a day (TID) | INTRAMUSCULAR | Status: DC
  Administered 2023-12-11 – 2023-12-12 (×2): 5000 [IU] via SUBCUTANEOUS
  Filled 2023-12-11 (×2): qty 1

## 2023-12-11 MED ORDER — ACETAMINOPHEN 650 MG RE SUPP: 650.0000 mg | Freq: Four times a day (QID) | RECTAL | Status: DC | PRN

## 2023-12-11 MED ORDER — PRAVASTATIN SODIUM 20 MG PO TABS
20.0000 mg | ORAL_TABLET | Freq: Every day | ORAL | Status: DC
  Administered 2023-12-12 – 2023-12-13 (×2): 20 mg via ORAL
  Filled 2023-12-11 (×2): qty 1

## 2023-12-11 MED ORDER — CLONAZEPAM 0.5 MG PO TABS: 0.5000 mg | ORAL_TABLET | Freq: Every day | ORAL | Status: DC

## 2023-12-11 MED ORDER — CLINDAMYCIN PHOSPHATE 600 MG/50ML IV SOLN
600.0000 mg | Freq: Once | INTRAVENOUS | Status: AC
  Administered 2023-12-11: 600 mg via INTRAVENOUS
  Filled 2023-12-11: qty 50

## 2023-12-11 MED ORDER — SODIUM CHLORIDE 0.9 % IV SOLN: INTRAVENOUS | Status: AC

## 2023-12-11 MED ORDER — PIPERACILLIN-TAZOBACTAM 3.375 G IVPB
3.3750 g | Freq: Three times a day (TID) | INTRAVENOUS | Status: DC
  Administered 2023-12-12 – 2023-12-13 (×5): 3.375 g via INTRAVENOUS
  Filled 2023-12-11 (×5): qty 50

## 2023-12-11 MED ORDER — ONDANSETRON HCL 4 MG PO TABS: 4.0000 mg | ORAL_TABLET | Freq: Four times a day (QID) | ORAL | Status: DC | PRN

## 2023-12-11 NOTE — ED Triage Notes (Signed)
 Pt presents for a wound check.  Pt had an I&D of an abscess in the R axilla yesterday.  Redness and swelling noted down R arm.  Pt was prescribed several PO antibiotic but infection seems to be worsening.    Pt was given Rocephin IM at UC prior to arrival today.

## 2023-12-11 NOTE — ED Provider Notes (Signed)
 [facility]    CSN: [id number] Arrival date & time: 12/11/23  1650      History   Chief Complaint Chief Complaint  Patient presents with   Wound Check    HPI Anyela Aymee Fomby is a 53 y.o. female.   Patient presents today for wound recheck.  She reports that she has had spreading of redness past border drawn yesterday in office.  She was given Rocephin injection yesterday and has been taking oral antibiotics.  She reports diffuse itching to her arm now.  The history is provided by the patient.  Wound Check Pertinent negatives include no shortness of breath.    Past Medical History:  Diagnosis Date   Acute congestive heart failure (HCC) 08/05/2021   Anemia    Anxiety    COPD (chronic obstructive pulmonary disease) (HCC)    Depression    GERD (gastroesophageal reflux disease)    Heart murmur    Hypertension     Patient Active Problem List   Diagnosis Date Noted   Rheumatism 10/09/2021   Heart failure, type unknown (HCC) 08/30/2021   Thrombocytopenia (HCC) 08/10/2021   Adrenal mass (HCC) 08/09/2021   Pleural effusion on right 08/09/2021   Acute respiratory failure with hypoxia (HCC) 08/07/2021   AKI (acute kidney injury) (HCC) 08/07/2021   Glucose intolerance 08/07/2021   Cirrhosis of liver (HCC) 08/06/2021   Generalized anxiety disorder 08/06/2021   GERD without esophagitis 08/06/2021   Hypokalemia 08/06/2021   Chronic diastolic heart failure (HCC) 08/06/2021   Elevated troponin level not due myocardial infarction 08/05/2021   Nicotine dependence, cigarettes, uncomplicated 08/05/2021   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   Hypertensive urgency 02/08/2015   Headache 02/08/2015    Past Surgical History:  Procedure Laterality Date   CHOLECYSTECTOMY     cyst from neck     IR THORACENTESIS ASP PLEURAL SPACE W/IMG GUIDE  08/09/2021   RIGHT HEART CATH N/A 08/10/2021   Procedure: RIGHT HEART CATH;  Surgeon: Darlis Eisenmenger, MD;  Location:  [facility] INVASIVE CV LAB;  Service: Cardiovascular;  Laterality: N/A;   RIGHT HEART CATH N/A 10/18/2022   Procedure: RIGHT HEART CATH;  Surgeon: Darlis Eisenmenger, MD;  Location: [facility] INVASIVE CV LAB;  Service: Cardiovascular;  Laterality: N/A;    OB History   No obstetric history on file.      Home Medications    Prior to Admission medications   Medication Sig Start Date End Date Taking? Authorizing Provider  acetaminophen  (TYLENOL ) 500 MG tablet Take 500-1,000 mg by mouth every 6 (six) hours as needed for mild pain or headache.    [provider]  Carboxymethylcellul-Glycerin (LUBRICATING EYE DROPS OP) Place 1 drop into both eyes daily as needed (irritation). Blink-N-Clear Patient not taking: Reported on 12/10/2023    [provider]  cephALEXin (KEFLEX) 500 MG capsule Take 1 capsule (500 mg total) by mouth 3 (three) times daily for 7 days. 12/10/23 12/17/23  Murrill, Samantha, FNP  clonazePAM  (KLONOPIN ) 0.5 MG tablet Take 0.5 mg by mouth in the morning.    [provider]  furosemide  (LASIX ) 40 MG tablet Take 2 tablets (80 mg total) by mouth daily. 10/02/23   Sheryl Donna, NP  hydrocortisone cream 1 % Apply 1 Application topically 2 (two) times daily as needed for itching. Patient not taking: Reported on 12/10/2023    [provider]  loperamide (IMODIUM A-D) 2 MG tablet Take 2 mg by mouth as needed for diarrhea or loose stools. Patient  not taking: Reported on 12/10/2023    [provider]  macitentan  (OPSUMIT ) 10 MG tablet Take 1 tablet (10 mg total) by mouth in the morning. 05/19/23   Darlis Eisenmenger, MD  metoprolol  succinate (TOPROL -XL) 100 MG 24 hr tablet Take 1 tablet (100 mg total) by mouth daily. 10/02/23   Sheryl Donna, NP  omeprazole (PRILOSEC) 20 MG capsule Take 20 mg by mouth daily before breakfast.    [provider]  Potassium Chloride  ER 20 MEQ TBCR Take 2 tablets by mouth once daily 12/08/23   McLean, Dalton S, MD  pravastatin   (PRAVACHOL ) 20 MG tablet Take 20 mg by mouth in the morning.    [provider]  Selexipag 1600 MCG TABS Take 1,600 mcg by mouth in the morning and at bedtime. 04/29/22   [provider]  sertraline  (ZOLOFT ) 100 MG tablet Take 150 mg by mouth daily.    [provider]  spironolactone  (ALDACTONE ) 25 MG tablet Take 1 tablet by mouth once daily 10/21/23   Diaz, Alma L, NP  sulfamethoxazole-trimethoprim (BACTRIM DS) 800-160 MG tablet Take 1 tablet by mouth 2 (two) times daily for 7 days. 12/10/23 12/17/23  Murrill, Samantha, FNP  Vitamin D , Ergocalciferol , (DRISDOL ) 1.25 MG (50000 UNIT) CAPS capsule Take 1 capsule (50,000 Units total) by mouth every 7 (seven) days. 01/14/23   Elois Hair, MD    Family History Family History  Problem Relation Age of Onset   Diabetes Mother    Heart disease Mother    Hyperlipidemia Mother    Diabetes Father    Cancer Father    Heart disease Father    Hyperlipidemia Father    Hypertension Father    Heart disease Maternal Grandmother    Hyperlipidemia Maternal Grandmother    Hypertension Maternal Grandmother    Heart disease Maternal Grandfather    Hyperlipidemia Maternal Grandfather    Hypertension Maternal Grandfather    Heart disease Paternal Grandmother    Hyperlipidemia Paternal Grandmother    Hypertension Paternal Grandmother    Stroke Paternal Grandmother    Diabetes Paternal Grandmother    Cancer Paternal Grandmother    Breast cancer Neg Hx     Social History Social History   Tobacco Use   Smoking status: Former    Current packs/day: 0.00    Average packs/day: 1.5 packs/day for 30.0 years (45.0 ttl pk-yrs)    Types: Cigarettes    Start date: 08/06/1991    Quit date: 08/05/2021    Years since quitting: 2.3    Passive exposure: Past   Smokeless tobacco: Never  Vaping Use   Vaping status: Never Used  Substance Use Topics   Alcohol use: No    Alcohol/week: 0.0 standard drinks of alcohol   Drug use: No      Allergies   Tyvaso [treprostinil], Lexapro [escitalopram], and Lisinopril    Review of Systems Review of Systems  Constitutional:  Negative for chills and fever.  Eyes:  Negative for discharge and redness.  Respiratory:  Negative for shortness of breath.   Skin:  Positive for color change.     Physical Exam Triage Vital Signs ED Triage Vitals  Encounter Vitals Group     BP 12/11/23 1705 124/76     Girls Systolic BP Percentile --      Girls Diastolic BP Percentile --      Boys Systolic BP Percentile --      Boys Diastolic BP Percentile --      Pulse Rate  12/11/23 1705 85     Resp 12/11/23 1705 20     Temp 12/11/23 1705 98.8 F (37.1 C)     Temp Source 12/11/23 1705 Oral     SpO2 12/11/23 1705 91 %     Weight --      Height --      Head Circumference --      Peak Flow --      Pain Score 12/11/23 1708 1     Pain Loc --      Pain Education --      Exclude from Growth Chart --    No data found.  Updated Vital Signs BP 124/76 (BP Location: Left Arm)   Pulse 85   Temp 98.8 F (37.1 C) (Oral)   Resp 20   LMP 06/24/2017 (Approximate)   SpO2 91%   Visual Acuity Right Eye Distance:   Left Eye Distance:   Bilateral Distance:    Right Eye Near:   Left Eye Near:    Bilateral Near:     Physical Exam Vitals and nursing note reviewed.  Constitutional:      General: She is not in acute distress.    Appearance: Normal appearance. She is not ill-appearing.  HENT:     Head: Normocephalic and atraumatic.   Eyes:     Conjunctiva/sclera: Conjunctivae normal.    Cardiovascular:     Rate and Rhythm: Normal rate.  Pulmonary:     Effort: Pulmonary effort is normal. No respiratory distress.   Skin:    Comments: Diffuse erythema to upper right arm with spreading into right forearm several inches past border drawn with skin marker   Neurological:     Mental Status: She is alert.   Psychiatric:        Mood and Affect: Mood normal.        Behavior: Behavior  normal.        Thought Content: Thought content normal.      UC Treatments / Results  Labs (all labs ordered are listed, but only abnormal results are displayed) Labs Reviewed - No data to display  EKG   Radiology No results found.  Procedures Procedures (including critical care time)  Medications Ordered in UC Medications - No data to display  Initial Impression / Assessment and Plan / UC Course  I have reviewed the triage vital signs and the nursing notes.  Pertinent labs & imaging results that were available during my care of the patient were reviewed by me and considered in my medical decision making (see chart for details).    Given worsening erythema despite oral antibiotic therapy and injection yesterday recommended further evaluation in the emergency room for stat imaging and labs, likely IV antibiotic treatment.  Patient is agreeable to same, family member with her will transport her via POV.  Final Clinical Impressions(s) / UC Diagnoses   Final diagnoses:  Right arm cellulitis   Discharge Instructions   None    ED Prescriptions   None    PDMP not reviewed this encounter.   Vernestine Gondola, PA-C 12/11/23 1736

## 2023-12-11 NOTE — ED Provider Notes (Signed)
 Pamela Ho   CSN: [id number] Arrival date & time: 12/11/23  1743     Patient presents with: Abscess and Recurrent Skin Infections   Pamela Ho is a 53 y.o. female.   53 y.o female with a PMH of HS presents to the ED with a chief complaint of right arm abscess x 1week.  Patient reports approximately a week ago she began to notice an abscess under her left arm, this went away on its own.  Later she developed 1 under her right armpit, reports she was seen at urgent care yesterday received a shot of Rocephin, in addition was sent home on Bactrim, Keflex.  She reports having taken 3 doses of each antibiotic total without any improvement in symptoms.  Reports the wound was actually marked by urgent care, and now redness has expanded past the wound.  She does not have any other systemic signs at this time, no nausea, no vomiting, no fevers. Reports that her history of at bedtime is usually self resolve at home.  She does have an ongoing history of tobacco use but denies any diabetes.  The history is provided by the patient.  Abscess Associated symptoms: no fever        Prior to Admission medications   Medication Sig Start Date End Date Taking? Authorizing Provider  acetaminophen  (TYLENOL ) 500 MG tablet Take 500-1,000 mg by mouth every 6 (six) hours as needed for mild pain or headache.    [provider]  Carboxymethylcellul-Glycerin (LUBRICATING EYE DROPS OP) Place 1 drop into both eyes daily as needed (irritation). Blink-N-Clear Patient not taking: Reported on 12/10/2023    [provider]  cephALEXin (KEFLEX) 500 MG capsule Take 1 capsule (500 mg total) by mouth 3 (three) times daily for 7 days. 12/10/23 12/17/23  Murrill, Samantha, FNP  clonazePAM  (KLONOPIN ) 0.5 MG tablet Take 0.5 mg by mouth in the morning.    [provider]  furosemide  (LASIX ) 40 MG tablet Take 2 tablets (80 mg total) by  mouth daily. 10/02/23   Sheryl Donna, NP  hydrocortisone cream 1 % Apply 1 Application topically 2 (two) times daily as needed for itching. Patient not taking: Reported on 12/10/2023    [provider]  loperamide (IMODIUM A-D) 2 MG tablet Take 2 mg by mouth as needed for diarrhea or loose stools. Patient not taking: Reported on 12/10/2023    [provider]  macitentan  (OPSUMIT ) 10 MG tablet Take 1 tablet (10 mg total) by mouth in the morning. 05/19/23   Darlis Eisenmenger, MD  metoprolol  succinate (TOPROL -XL) 100 MG 24 hr tablet Take 1 tablet (100 mg total) by mouth daily. 10/02/23   Sheryl Donna, NP  omeprazole (PRILOSEC) 20 MG capsule Take 20 mg by mouth daily before breakfast.    [provider]  Potassium Chloride  ER 20 MEQ TBCR Take 2 tablets by mouth once daily 12/08/23   McLean, Dalton S, MD  pravastatin  (PRAVACHOL ) 20 MG tablet Take 20 mg by mouth in the morning.    [provider]  Selexipag 1600 MCG TABS Take 1,600 mcg by mouth in the morning and at bedtime. 04/29/22   [provider]  sertraline  (ZOLOFT ) 100 MG tablet Take 150 mg by mouth daily.    [provider]  spironolactone  (ALDACTONE ) 25 MG tablet Take 1 tablet by mouth once daily 10/21/23   Sheryl Donna, NP  sulfamethoxazole-trimethoprim (BACTRIM DS) 800-160 MG tablet Take  1 tablet by mouth 2 (two) times daily for 7 days. 12/10/23 12/17/23  Maryruth Sol, FNP  Vitamin D , Ergocalciferol , (DRISDOL ) 1.25 MG (50000 UNIT) CAPS capsule Take 1 capsule (50,000 Units total) by mouth every 7 (seven) days. 01/14/23   Elois Hair, MD    Allergies: Tyvaso [treprostinil], Lexapro [escitalopram], and Lisinopril     Review of Systems  Constitutional:  Negative for chills and fever.  Respiratory:  Negative for shortness of breath.   Cardiovascular:  Negative for chest pain.  Skin:  Positive for wound.  All other systems reviewed and are negative.   Updated Vital Signs BP (!) 151/69  (BP Location: Left Arm)   Pulse 80   Temp 98.3 F (36.8 C) (Oral)   Resp 20   Ht 5' 2 (1.575 m)   Wt 93 kg   LMP 06/24/2017 (Approximate)   SpO2 92%   BMI 37.49 kg/m   Physical Exam Vitals and nursing Ho reviewed.  Constitutional:      Appearance: Normal appearance.   Cardiovascular:     Rate and Rhythm: Normal rate.  Pulmonary:     Effort: Pulmonary effort is normal.  Abdominal:     General: Abdomen is flat.   Musculoskeletal:     Cervical back: Normal range of motion and neck supple.   Skin:    General: Skin is warm and dry.     Findings: Erythema present.     Comments: Streaking, erythema, swelling noted right under the right armpit.  Streaking now goes outside of the markings placed yesterday.   Neurological:     Mental Status: She is alert and oriented to person, place, and time.        (all labs ordered are listed, but only abnormal results are displayed) Labs Reviewed  COMPREHENSIVE METABOLIC PANEL WITH GFR - Abnormal; Notable for the following components:      Result Value   Glucose, Bld 160 (*)    All other components within normal limits  CBC WITH DIFFERENTIAL/PLATELET - Abnormal; Notable for the following components:   WBC 3.5 (*)    RBC 5.36 (*)    Hemoglobin 11.6 (*)    MCV 77.6 (*)    MCH 21.6 (*)    MCHC 27.9 (*)    RDW 18.6 (*)    Platelets 105 (*)    Lymphs Abs 0.6 (*)    All other components within normal limits  I-STAT CG4 LACTIC ACID, ED - Abnormal; Notable for the following components:   Lactic Acid, Venous 2.9 (*)    All other components within normal limits  CULTURE, BLOOD (ROUTINE X 2)  CULTURE, BLOOD (ROUTINE X 2)  I-STAT CG4 LACTIC ACID, ED    EKG: None  Radiology: No results found.   Procedures   Medications Ordered in the ED  clindamycin (CLEOCIN) IVPB 600 mg (has no administration in time range)    Clinical Course as of 12/11/23 2015  Thu Dec 11, 2023  1859 Lactic Acid, Venous(!!): 2.9 [JS]    Clinical  Course User Index [JS] Leenah Seidner, PA-C                                 Medical Decision Making Amount and/or Complexity of Data Reviewed Labs: ordered. Decision-making details documented in ED Course.  Risk Prescription drug management.    This patient presents to the ED for concern of cellulitis, this involves a number of treatment  options, and is a complaint that carries with it a high risk of complications and morbidity.  The differential diagnosis includes recurrent cellulitis, sepsis.    Co morbidities: Discussed in HPI   Brief History:  See HPI  EMR reviewed including pt PMHx, past surgical history and past visits to ER.   See HPI for more details   Lab Tests:  I ordered and independently interpreted labs.  The pertinent results include:    CBC with some leukopenia present.  Hemoglobin stable.  CMP with no electrolyte derangement, LFTs are within normal limits.  Lactic acid is 2.9, blood cultures have been ordered.   Imaging Studies:  No imaging studies ordered for this patient  Medicines ordered:  I ordered medication including IV clindamycin for cellulitis Reevaluation of the patient after these medicines showed that the patient stayed the same I have reviewed the patients home medicines and have made adjustments as needed  Reevaluation:  After the interventions noted above I re-evaluated patient and found that they have :stayed the same  Social Determinants of Health:  The patient's social determinants of health were a factor in the care of this patient   Problem List / ED Course:  Patient presented to the ED with a chief complaint of right underarm abscess which has been ongoing for about a week.  Reports she had an I&D at urgent care yesterday, states that she has taken 3 doses of her Bactrim, Keflex without any improvement in symptoms.  She does report most of her at bedtime does resolve at home, she has never had to come in for I&D's or  admission.  There is increased streaking in the skin, erythema, does appear to pass the markings that urgent care placed yesterday.  She does not have any systemic signs.  No leukocytosis, however lactic acid is elevated on today's visit.  Blood cultures have been ordered, she was started on IV clindamycin to help treat her failed patient treatment of cellulitis. Spoke to hospitalist service Dr. Andy Bannister, who agreed with admission and management at this time.  Patient hemodynamically stable for admission. I did discuss with hospitalist patient being on selexipag 1600 micrograms, will obtain medication reconciliation in order to provide this for patient.  According to her med recons she takes 1 at bedtime and 1 in the morning.   Dispostion:  After consideration of the diagnostic results and the patients response to treatment, I feel that the patent would benefit from admission for further management of her colitis after failed outpatient treatment.   Portions of this Ho were generated with Scientist, clinical (histocompatibility and immunogenetics). Dictation errors may occur despite best attempts at proofreading.      Final diagnoses:  Cellulitis of right axilla    ED Discharge Orders     None          Luellen Sages, PA-C 12/11/23 2015    Tegeler, Marine Sia, MD 12/11/23 2314

## 2023-12-11 NOTE — Progress Notes (Signed)
 Pharmacy Antibiotic Note  Pamela Ho is a 53 y.o. female admitted on 12/11/2023 with cellulitis.  Pharmacy has been consulted for vancomycin and Zosyn dosing.  Plan: Zosyn 3.375g IV q8h (4 hour infusion). Vancomycin 2000 mg IV x1 then vancomycin 1250 mg IV q24h ( AUC 555, Scr 0.96 mg/dL, Vd 0.5)  Daily SCr while on vancomycin and Zosyn combo   Height: 5' 2 (157.5 cm) Weight: 93 kg (205 lb) IBW/kg (Calculated) : 50.1  Temp (24hrs), Avg:98.6 F (37 C), Min:98.3 F (36.8 C), Max:98.8 F (37.1 C)  Recent Labs  Lab 12/10/23 1747 12/11/23 1825 12/11/23 1836  WBC 3.8 3.5*  --   CREATININE  --  0.96  --   LATICACIDVEN  --   --  2.9*    Estimated Creatinine Clearance: 71.2 mL/min (by C-G formula based on SCr of 0.96 mg/dL).    Allergies  Allergen Reactions   Tyvaso [Treprostinil] Cough    Headaches    Lexapro [Escitalopram] Other (See Comments)    Made me crazy    Lisinopril  Other (See Comments)    Angioedema    Antimicrobials this admission: 6/19 clindamycin x 1  6/19 vancomycin >>  6/19 Zosyn >>   Dose adjustments this admission:   Microbiology results: 6/19 BCx:  6/19 MRSA PCR:     Van Gelinas, PharmD, BCPS 12/11/2023 9:11 PM

## 2023-12-11 NOTE — Telephone Encounter (Signed)
 Call received from [place] with [facility] stating the Aerobic Culture w Gram Stain (superficial specimen) (Order [id number]) was collected with a UTM-RT swab (single swab red top) and needs to be collected with a copan swab (double swab in red top) in order to be processed. It will need to be recollected.   Pt's discharge instructions indicate pt is to return for a wound check today. Jami Mcclintock, PA-C provider on duty made aware.

## 2023-12-11 NOTE — ED Triage Notes (Addendum)
 Here for wound check s/p I&D right axilla yesterday. Redness noted to extend beyond skin marker from yesterday. Dressing in place and not removed. States she has taken kephlex x 3 and bactrim x 2. Pt's O2 sats 89-91% on RA. States this is her baseline and she wears oxygen  at night.   Wound culture obtained yesterday was collected with incorrect swab. Needs to recollected to be processed per microlab. Dressing not removed prior to pt leaving for ED so unable to collect here.

## 2023-12-11 NOTE — H&P (Signed)
 History and Physical    Pamela Ho ZOX:[id number] DOB: [birth year] DOA: 12/11/2023  PCP: Washington Hacker, MD  Patient coming from: home  I have personally briefly reviewed patient's old medical records in [facility]  Chief Complaint: abscess with expanding cellulitis despite  I and D  HPI: Pamela Ho is a 53 y.o. female with medical history significant of CHF, Anxiety, COPD on home O2 at night, Depression , GERD , Hypertension, Anemia, hidradenitis who presents to ED with Right underarm abscess x 6days. Per patient she was taking left over antibiotic at home by after 3 days no improvement. Patient states she then followed up at [facility] yesterday. At UC she has I and D of abscess and was then given IM CTX and discharged on Bactrim and keflex. Patient states overnight area of redness as progressed so she returned to wound care for wound check and further evaluation. Due to noted expansion pass prior marked lines patient was sent to ED.  ED Course:  Afeb, bp 151/69, hr 80 , rr 20 , sat 92%  Wbc 3.5, hgb 11.6 Na 141, K 4,  CL 101, cr 0.96,  Lactic 2.9 Wbc 3.3, plt 108 Cr 1.05 ( 0.96) Tx clindamycin Review of Systems: As per HPI otherwise 10 point review of systems negative.   Past Medical History:  Diagnosis Date   Acute congestive heart failure (HCC) 08/05/2021   Anemia    Anxiety    COPD (chronic obstructive pulmonary disease) (HCC)    Depression    GERD (gastroesophageal reflux disease)    Heart murmur    Hypertension     Past Surgical History:  Procedure Laterality Date   CHOLECYSTECTOMY     cyst from neck     IR THORACENTESIS ASP PLEURAL SPACE W/IMG GUIDE  08/09/2021   RIGHT HEART CATH N/A 08/10/2021   Procedure: RIGHT HEART CATH;  Surgeon: Darlis Eisenmenger, MD;  Location: [facility] INVASIVE CV LAB;  Service: Cardiovascular;  Laterality: N/A;   RIGHT HEART CATH N/A 10/18/2022   Procedure: RIGHT HEART CATH;  Surgeon: Darlis Eisenmenger, MD;  Location: [facility] INVASIVE CV  LAB;  Service: Cardiovascular;  Laterality: N/A;     reports that she quit smoking about 2 years ago. Her smoking use included cigarettes. She started smoking about 32 years ago. She has a 45 pack-year smoking history. She has been exposed to tobacco smoke. She has never used smokeless tobacco. She reports that she does not drink alcohol and does not use drugs.  Allergies  Allergen Reactions   Tyvaso [Treprostinil] Cough    Headaches    Lexapro [Escitalopram] Other (See Comments)    Made me crazy    Lisinopril  Other (See Comments)    Angioedema    Family History  Problem Relation Age of Onset   Diabetes Mother    Heart disease Mother    Hyperlipidemia Mother    Diabetes Father    Cancer Father    Heart disease Father    Hyperlipidemia Father    Hypertension Father    Heart disease Maternal Grandmother    Hyperlipidemia Maternal Grandmother    Hypertension Maternal Grandmother    Heart disease Maternal Grandfather    Hyperlipidemia Maternal Grandfather    Hypertension Maternal Grandfather    Heart disease Paternal Grandmother    Hyperlipidemia Paternal Grandmother    Hypertension Paternal Grandmother    Stroke Paternal Grandmother    Diabetes Paternal Grandmother    Cancer Paternal Grandmother  Breast cancer Neg Hx     Prior to Admission medications   Medication Sig Start Date End Date Taking? Authorizing Provider  acetaminophen  (TYLENOL ) 500 MG tablet Take 500-1,000 mg by mouth every 6 (six) hours as needed for mild pain or headache.    [provider]  Carboxymethylcellul-Glycerin (LUBRICATING EYE DROPS OP) Place 1 drop into both eyes daily as needed (irritation). Blink-N-Clear Patient not taking: Reported on 12/10/2023    [provider]  cephALEXin (KEFLEX) 500 MG capsule Take 1 capsule (500 mg total) by mouth 3 (three) times daily for 7 days. 12/10/23 12/17/23  Murrill, Samantha, FNP  clonazePAM  (KLONOPIN ) 0.5 MG tablet Take 0.5 mg by mouth in  the morning.    [provider]  furosemide  (LASIX ) 40 MG tablet Take 2 tablets (80 mg total) by mouth daily. 10/02/23   Sheryl Donna, NP  hydrocortisone cream 1 % Apply 1 Application topically 2 (two) times daily as needed for itching. Patient not taking: Reported on 12/10/2023    [provider]  loperamide (IMODIUM A-D) 2 MG tablet Take 2 mg by mouth as needed for diarrhea or loose stools. Patient not taking: Reported on 12/10/2023    [provider]  macitentan  (OPSUMIT ) 10 MG tablet Take 1 tablet (10 mg total) by mouth in the morning. 05/19/23   Darlis Eisenmenger, MD  metoprolol  succinate (TOPROL -XL) 100 MG 24 hr tablet Take 1 tablet (100 mg total) by mouth daily. 10/02/23   Sheryl Donna, NP  omeprazole (PRILOSEC) 20 MG capsule Take 20 mg by mouth daily before breakfast.    [provider]  Potassium Chloride  ER 20 MEQ TBCR Take 2 tablets by mouth once daily 12/08/23   McLean, Dalton S, MD  pravastatin  (PRAVACHOL ) 20 MG tablet Take 20 mg by mouth in the morning.    [provider]  Selexipag 1600 MCG TABS Take 1,600 mcg by mouth in the morning and at bedtime. 04/29/22   [provider]  sertraline  (ZOLOFT ) 100 MG tablet Take 150 mg by mouth daily.    [provider]  spironolactone  (ALDACTONE ) 25 MG tablet Take 1 tablet by mouth once daily 10/21/23   Diaz, Alma L, NP  sulfamethoxazole-trimethoprim (BACTRIM DS) 800-160 MG tablet Take 1 tablet by mouth 2 (two) times daily for 7 days. 12/10/23 12/17/23  Maryruth Sol, FNP  Vitamin D , Ergocalciferol , (DRISDOL ) 1.25 MG (50000 UNIT) CAPS capsule Take 1 capsule (50,000 Units total) by mouth every 7 (seven) days. 01/14/23   Elois Hair, MD    Physical Exam: Vitals:   12/11/23 1748 12/11/23 1812  BP: (!) 151/69   Pulse: 80   Resp: 20   Temp: 98.3 F (36.8 C)   TempSrc: Oral   SpO2: 92%   Weight:  93 kg  Height:  5' 2 (1.575 m)    Constitutional: NAD, calm,  comfortable Vitals:   12/11/23 1748 12/11/23 1812  BP: (!) 151/69   Pulse: 80   Resp: 20   Temp: 98.3 F (36.8 C)   TempSrc: Oral   SpO2: 92%   Weight:  93 kg  Height:  5' 2 (1.575 m)   Eyes: PERRL, lids and conjunctivae normal ENMT: Mucous membranes are moist. Posterior pharynx clear of any exudate or lesions.Normal dentition.  Neck: normal, supple, no masses, no thyromegaly Respiratory: clear to auscultation bilaterally, no wheezing, no crackles. Normal respiratory effort. No accessory muscle use.  Cardiovascular: Regular rate and rhythm, no murmurs / rubs / gallops.  No extremity edema. 2+ pedal pulses. No carotid bruits.  Abdomen: no tenderness, no masses palpated. No hepatosplenomegaly. Bowel sounds positive.  Musculoskeletal: no clubbing / cyanosis. No joint deformity upper and lower extremities. Good ROM, no contractures. Normal muscle tone.  Skin: right under arm with noted indurated area , mild tender to touch  note associated erythema streaking down arm, past initial mark , no significant tenderness Neurologic: CN 2-12 grossly intact. Sensation intact, . Strength 5/5 in all 4.  Psychiatric: Normal judgment and insight. Alert and oriented x 3. Normal mood.    Labs on Admission: I have personally reviewed following labs and imaging studies  CBC: Recent Labs  Lab 12/10/23 1747 12/11/23 1825  WBC 3.8 3.5*  NEUTROABS 2.8 2.6  HGB 11.9 11.6*  HCT 42.7 41.6  MCV 79 77.6*  PLT 125* 105*   Basic Metabolic Panel: Recent Labs  Lab 12/11/23 1825  NA 141  K 4.0  CL 101  CO2 27  GLUCOSE 160*  BUN 12  CREATININE 0.96  CALCIUM 9.6   GFR: Estimated Creatinine Clearance: 71.2 mL/min (by C-G formula based on SCr of 0.96 mg/dL). Liver Function Tests: Recent Labs  Lab 12/11/23 1825  AST 16  ALT 18  ALKPHOS 93  BILITOT 0.6  PROT 7.1  ALBUMIN  3.6   No results for input(s): LIPASE, AMYLASE in the last 168 hours. No results for input(s): AMMONIA in the last  168 hours. Coagulation Profile: No results for input(s): INR, PROTIME in the last 168 hours. Cardiac Enzymes: No results for input(s): CKTOTAL, CKMB, CKMBINDEX, TROPONINI in the last 168 hours. BNP (last 3 results) No results for input(s): PROBNP in the last 8760 hours. HbA1C: No results for input(s): HGBA1C in the last 72 hours. CBG: No results for input(s): GLUCAP in the last 168 hours. Lipid Profile: No results for input(s): CHOL, HDL, LDLCALC, TRIG, CHOLHDL, LDLDIRECT in the last 72 hours. Thyroid  Function Tests: No results for input(s): TSH, T4TOTAL, FREET4, T3FREE, THYROIDAB in the last 72 hours. Anemia Panel: No results for input(s): VITAMINB12, FOLATE, FERRITIN, TIBC, IRON, RETICCTPCT in the last 72 hours. Urine analysis: No results found for: COLORURINE, APPEARANCEUR, LABSPEC, PHURINE, GLUCOSEU, HGBUR, BILIRUBINUR, KETONESUR, PROTEINUR, UROBILINOGEN, NITRITE, LEUKOCYTESUR  Radiological Exams on Admission: No results found.  EKG: Independently reviewed. N/a  Assessment/Plan  Right under Arm Abscess with associated expanding cellulitis  - s/p I/d 6/18 at UC -tx with CTX IM, bactrim and kelfex for less than 24 hours  - p/w progression past previously marked out line  - all in background of Hidradenitis  - admit to med tele - start vanc/zosyn , -de-escalate as able  -MRSA pcr  - consider surgery consult  if no improvement  -would care consult placed  -cycle inflammatory markers, trend lactic acid    CHFpef -well compensated  -resume GDMT once med rec completed   COPD  -on home O2 at bedtime  -resume home regimen    Pulmonary hypertension  -resume home regimen -medication to be brought in by husband in am     Depression /Anxiety  -continue home regimen   GERD -ppi   Hypertension -stable  -resume once med rec completed   Anemia -stable     DVT prophylaxis: heparin  Code  Status: full/ as discussed per patient wishes in event of cardiac arrest  Family Communication: none at bedside Disposition Plan: full/ as discussed per patient wishes in event of cardiac arrest  Consults called: wound care Admission status: med tele   Wiliam Harder  Brandy Cal MD [facility]   If 7PM-7AM, please contact night-coverage [email or web address] Password [facility]  12/11/2023, 7:56 PM

## 2023-12-11 NOTE — ED Notes (Signed)
 Patient is being discharged from the Urgent Care and sent to the Emergency Department via pov . Per Jami Mcclintock, PA-C, patient is in need of higher level of care due to wound infection. Patient is aware and verbalizes understanding of plan of care.  Vitals:   12/11/23 1705  BP: 124/76  Pulse: 85  Resp: 20  Temp: 98.8 F (37.1 C)  SpO2: 91%

## 2023-12-12 ENCOUNTER — Other Ambulatory Visit: Payer: Self-pay

## 2023-12-12 DIAGNOSIS — L03111 Cellulitis of right axilla: Secondary | ICD-10-CM

## 2023-12-12 DIAGNOSIS — D696 Thrombocytopenia, unspecified: Secondary | ICD-10-CM

## 2023-12-12 DIAGNOSIS — E876 Hypokalemia: Secondary | ICD-10-CM

## 2023-12-12 LAB — CBC
HCT: 36.7 % (ref 36.0–46.0)
Hemoglobin: 10.2 g/dL — ABNORMAL LOW (ref 12.0–15.0)
MCH: 22.1 pg — ABNORMAL LOW (ref 26.0–34.0)
MCHC: 27.8 g/dL — ABNORMAL LOW (ref 30.0–36.0)
MCV: 79.4 fL — ABNORMAL LOW (ref 80.0–100.0)
Platelets: 90 10*3/uL — ABNORMAL LOW (ref 150–400)
RBC: 4.62 MIL/uL (ref 3.87–5.11)
RDW: 17.9 % — ABNORMAL HIGH (ref 11.5–15.5)
WBC: 2.5 10*3/uL — ABNORMAL LOW (ref 4.0–10.5)
nRBC: 0 % (ref 0.0–0.2)

## 2023-12-12 LAB — COMPREHENSIVE METABOLIC PANEL WITH GFR
ALT: 16 U/L (ref 0–44)
AST: 12 U/L — ABNORMAL LOW (ref 15–41)
Albumin: 3 g/dL — ABNORMAL LOW (ref 3.5–5.0)
Alkaline Phosphatase: 72 U/L (ref 38–126)
Anion gap: 10 (ref 5–15)
BUN: 12 mg/dL (ref 6–20)
CO2: 24 mmol/L (ref 22–32)
Calcium: 9.4 mg/dL (ref 8.9–10.3)
Chloride: 105 mmol/L (ref 98–111)
Creatinine, Ser: 1.03 mg/dL — ABNORMAL HIGH (ref 0.44–1.00)
GFR, Estimated: >60 mL/min (ref >60)
Glucose, Bld: 157 mg/dL — ABNORMAL HIGH (ref 70–99)
Potassium: 3.4 mmol/L — ABNORMAL LOW (ref 3.5–5.1)
Sodium: 139 mmol/L (ref 135–145)
Total Bilirubin: 0.8 mg/dL (ref 0.0–1.2)
Total Protein: 5.9 g/dL — ABNORMAL LOW (ref 6.5–8.1)

## 2023-12-12 LAB — LACTIC ACID, PLASMA
Lactic Acid, Venous: 1 mmol/L (ref 0.5–1.9)
Lactic Acid, Venous: 1.4 mmol/L (ref 0.5–1.9)

## 2023-12-12 LAB — C-REACTIVE PROTEIN: CRP: 1.3 mg/dL — ABNORMAL HIGH (ref <1.0)

## 2023-12-12 LAB — HIV ANTIBODY (ROUTINE TESTING W REFLEX): HIV Screen 4th Generation wRfx: NONREACTIVE

## 2023-12-12 LAB — CG4 I-STAT (LACTIC ACID): Lactic Acid, Venous: 2.4 mmol/L (ref 0.5–1.9)

## 2023-12-12 MED ORDER — CLONAZEPAM 0.125 MG PO TBDP
0.2500 mg | ORAL_TABLET | Freq: Two times a day (BID) | ORAL | Status: DC
  Administered 2023-12-12 – 2023-12-13 (×3): 0.25 mg via ORAL
  Filled 2023-12-12 (×3): qty 2

## 2023-12-12 MED ORDER — LINEZOLID 600 MG/300ML IV SOLN
600.0000 mg | Freq: Two times a day (BID) | INTRAVENOUS | Status: DC
  Administered 2023-12-12: 600 mg via INTRAVENOUS
  Filled 2023-12-12 (×2): qty 300

## 2023-12-12 MED ORDER — MACITENTAN 10 MG PO TABS
10.0000 mg | ORAL_TABLET | ORAL | Status: AC
  Administered 2023-12-12: 10 mg via ORAL

## 2023-12-12 MED ORDER — SELEXIPAG 1600 MCG PO TABS: 1.0000 | ORAL_TABLET | Freq: Once | ORAL | Status: DC

## 2023-12-12 MED ORDER — DOXYCYCLINE HYCLATE 100 MG PO TABS
100.0000 mg | ORAL_TABLET | Freq: Two times a day (BID) | ORAL | Status: DC
  Administered 2023-12-12 – 2023-12-13 (×3): 100 mg via ORAL
  Filled 2023-12-12 (×4): qty 1

## 2023-12-12 MED ORDER — SELEXIPAG 1600 MCG PO TABS
1.0000 | ORAL_TABLET | Freq: Once | ORAL | Status: AC
  Administered 2023-12-12: 1600 ug via ORAL

## 2023-12-12 MED ORDER — POTASSIUM CHLORIDE 20 MEQ PO PACK
40.0000 meq | PACK | Freq: Once | ORAL | Status: AC
  Administered 2023-12-12: 40 meq via ORAL
  Filled 2023-12-12: qty 2

## 2023-12-12 MED ORDER — DIPHENHYDRAMINE HCL 25 MG PO CAPS
25.0000 mg | ORAL_CAPSULE | Freq: Once | ORAL | Status: AC
  Administered 2023-12-12: 25 mg via ORAL
  Filled 2023-12-12: qty 1

## 2023-12-12 NOTE — Progress Notes (Signed)
 [facility]  PROGRESS NOTE  Pamela Ho ZOX:[id number] DOB: [birth year] DOA: 12/11/2023 PCP: Washington Hacker, MD   Brief HPI:   53 y.o. female with medical history significant of CHF, Anxiety, COPD on home O2 at night, Depression , GERD , Hypertension, Anemia, hidradenitis who presents to ED with Right underarm abscess x 6days. Per patient she was taking left over antibiotic at home by after 3 days no improvement. Patient states she then followed up at [facility] yesterday. At UC she has I and D of abscess and was then given IM CTX and discharged on Bactrim and keflex. Patient states overnight area of redness as progressed so she returned to wound care for wound check and further evaluation     Assessment/Plan:   Right under Arm Abscess with associated expanding cellulitis  -Status post incision and drainage on 6/18 at urgent care center -Treated with ceftriaxone IM, Bactrim and Keflex for less than 24 hours -Due to worsening erythema, came to ED -She has history of hidradenitis suppurativa -Started vancomycin and Zosyn, vancomycin stopped after possible red man syndrome -Erythema has significantly improved -Started on Zyvox and Zosyn, will discontinue Zyvox and start p.o. doxycycline  -Continue doxycycline  and Zosyn -If improving, can be discharged on Augmentin and doxycycline  in a.m. - Lactic acid is down to 1.4, CRP 1.3   CHFpef -well compensated     COPD  -on home O2 at bedtime  -resume home regimen      Pulmonary hypertension  -resume home regimen       Depression /Anxiety  -continue home regimen    GERD -ppi    Hypertension -stable      Anemia -stable   Hypokalemia - Potassium was 3.4 - Replace potassium and follow BMP in am  Medications     clonazepam   0.25 mg Oral BID   heparin   5,000 Units Subcutaneous Q8H   pravastatin   20 mg Oral Daily     Data Reviewed:   CBG:  No results for input(s): GLUCAP in the last 168 hours.  SpO2: 99 % O2  Flow Rate (L/min): 3.5 L/min    Vitals:   12/11/23 1748 12/11/23 1812 12/11/23 2308 12/12/23 0436  BP: (!) 151/69  128/63 130/88  Pulse: 80  77 72  Resp: 20  18 18   Temp: 98.3 F (36.8 C)  98.7 F (37.1 C) 98.4 F (36.9 C)  TempSrc: Oral  Oral Oral  SpO2: 92%  100% 99%  Weight:  93 kg    Height:  5' 2 (1.575 m)        Data Reviewed:  Basic Metabolic Panel: Recent Labs  Lab 12/11/23 1825 12/11/23 2052 12/12/23 0350  NA 141  --  139  K 4.0  --  3.4*  CL 101  --  105  CO2 27  --  24  GLUCOSE 160*  --  157*  BUN 12  --  12  CREATININE 0.96 1.05* 1.03*  CALCIUM 9.6  --  9.4    CBC: Recent Labs  Lab 12/10/23 1747 12/11/23 1825 12/11/23 2052 12/12/23 0350  WBC 3.8 3.5* 3.3* 2.5*  NEUTROABS 2.8 2.6  --   --   HGB 11.9 11.6* 12.0 10.2*  HCT 42.7 41.6 41.3 36.7  MCV 79 77.6* 76.1* 79.4*  PLT 125* 105* 108* 90*    LFT Recent Labs  Lab 12/11/23 1825 12/12/23 0350  AST 16 12*  ALT 18 16  ALKPHOS 93 72  BILITOT 0.6 0.8  PROT 7.1 5.9*  ALBUMIN  3.6 3.0*     Antibiotics: Anti-infectives (From admission, onward)    Start     Dose/Rate Route Frequency Ordered Stop   12/12/23 2200  vancomycin (VANCOREADY) IVPB 1250 mg/250 mL  Status:  Discontinued        1,250 mg 166.7 mL/hr over 90 Minutes Intravenous Every 24 hours 12/11/23 2112 12/12/23 0104   12/12/23 0600  linezolid (ZYVOX) IVPB 600 mg        600 mg 300 mL/hr over 60 Minutes Intravenous Every 12 hours 12/12/23 0116     12/11/23 2200  piperacillin-tazobactam (ZOSYN) IVPB 3.375 g        3.375 g 12.5 mL/hr over 240 Minutes Intravenous Every 8 hours 12/11/23 2112     12/11/23 2115  vancomycin (VANCOCIN) IVPB 1000 mg/200 mL premix  Status:  Discontinued        1,000 mg 200 mL/hr over 60 Minutes Intravenous  Once 12/11/23 2103 12/11/23 2107   12/11/23 2115  vancomycin (VANCOREADY) IVPB 2000 mg/400 mL  Status:  Discontinued        2,000 mg 200 mL/hr over 120 Minutes Intravenous  Once 12/11/23 2112  12/12/23 0040   12/11/23 1930  clindamycin (CLEOCIN) IVPB 600 mg        600 mg 100 mL/hr over 30 Minutes Intravenous  Once 12/11/23 1929 12/11/23 2137        DVT prophylaxis: SCDs, discontinue heparin  due to no platelet count  Code Status: Full code  Family Communication: Discussed with patient's husband at bedside   CONSULTS none   Subjective   Arm swelling and redness has improved today.   Objective    Physical Examination:   General-appears in no acute distress Heart-S1-S2, regular, no murmur auscultated Lungs-clear to auscultation bilaterally, no wheezing or crackles auscultated Abdomen-soft, nontender, no organomegaly Extremities-no edema in the lower extremities Neuro-alert, oriented x3, no focal deficit noted Skin-right arm erythema has significantly improved  Status is: Inpatient:             Pamela Ho   [facility] If 7PM-7AM, please contact night-coverage at [email or web address], Office  [phone]   12/12/2023, 7:51 AM  LOS: 1 day

## 2023-12-12 NOTE — Progress Notes (Signed)
   12/12/23 0906  TOC Brief Assessment  Insurance and Status Reviewed  Patient has primary care physician Yes  Home environment has been reviewed home w/ family  Prior level of function: independent  Prior/Current Home Services No current home services  Social Drivers of Health Review SDOH reviewed no interventions necessary  Readmission risk has been reviewed Yes  Transition of care needs transition of care needs identified, TOC will continue to follow

## 2023-12-12 NOTE — Plan of Care (Signed)
   Problem: Activity: Goal: Risk for activity intolerance will decrease Outcome: Progressing   Problem: Pain Managment: Goal: General experience of comfort will improve and/or be controlled Outcome: Progressing   Problem: Safety: Goal: Ability to remain free from injury will improve Outcome: Progressing

## 2023-12-13 LAB — CBC WITH DIFFERENTIAL/PLATELET
Abs Immature Granulocytes: 0.01 10*3/uL (ref 0.00–0.07)
Basophils Absolute: 0 10*3/uL (ref 0.0–0.1)
Basophils Relative: 0 %
Eosinophils Absolute: 0 10*3/uL (ref 0.0–0.5)
Eosinophils Relative: 2 %
HCT: 36.4 % (ref 36.0–46.0)
Hemoglobin: 10.2 g/dL — ABNORMAL LOW (ref 12.0–15.0)
Immature Granulocytes: 1 %
Lymphocytes Relative: 31 %
Lymphs Abs: 0.6 10*3/uL — ABNORMAL LOW (ref 0.7–4.0)
MCH: 22.1 pg — ABNORMAL LOW (ref 26.0–34.0)
MCHC: 28 g/dL — ABNORMAL LOW (ref 30.0–36.0)
MCV: 79 fL — ABNORMAL LOW (ref 80.0–100.0)
Monocytes Absolute: 0.1 10*3/uL (ref 0.1–1.0)
Monocytes Relative: 6 %
Neutro Abs: 1.3 10*3/uL — ABNORMAL LOW (ref 1.7–7.7)
Neutrophils Relative %: 60 %
Platelets: 86 10*3/uL — ABNORMAL LOW (ref 150–400)
RBC: 4.61 MIL/uL (ref 3.87–5.11)
RDW: 18 % — ABNORMAL HIGH (ref 11.5–15.5)
WBC: 2.1 10*3/uL — ABNORMAL LOW (ref 4.0–10.5)
nRBC: 0 % (ref 0.0–0.2)

## 2023-12-13 LAB — BASIC METABOLIC PANEL WITH GFR
Anion gap: 7 (ref 5–15)
BUN: 12 mg/dL (ref 6–20)
CO2: 27 mmol/L (ref 22–32)
Calcium: 9.5 mg/dL (ref 8.9–10.3)
Chloride: 106 mmol/L (ref 98–111)
Creatinine, Ser: 0.75 mg/dL (ref 0.44–1.00)
GFR, Estimated: >60 mL/min (ref >60)
Glucose, Bld: 135 mg/dL — ABNORMAL HIGH (ref 70–99)
Potassium: 3.6 mmol/L (ref 3.5–5.1)
Sodium: 140 mmol/L (ref 135–145)

## 2023-12-13 MED ORDER — DOXYCYCLINE HYCLATE 100 MG PO TABS: 100.0000 mg | ORAL_TABLET | Freq: Two times a day (BID) | ORAL | 0 refills | Status: AC

## 2023-12-13 MED ORDER — SELEXIPAG 1600 MCG PO TABS
1.0000 | ORAL_TABLET | Freq: Once | ORAL | Status: AC
  Administered 2023-12-13: 1600 ug via ORAL
  Filled 2023-12-13 (×2): qty 1

## 2023-12-13 MED ORDER — SELEXIPAG 1600 MCG PO TABS: 1600.0000 ug | ORAL_TABLET | Freq: Two times a day (BID) | ORAL | Status: DC

## 2023-12-13 MED ORDER — AMOXICILLIN-POT CLAVULANATE 875-125 MG PO TABS: 1.0000 | ORAL_TABLET | Freq: Two times a day (BID) | ORAL | 0 refills | Status: AC

## 2023-12-13 NOTE — Discharge Summary (Signed)
 Physician Discharge Summary   Patient: Pamela Ho MRN: [id number] DOB: 06-06-1971  Admit date:     12/11/2023  Discharge date: 12/13/23  Discharge Physician: Sabas GORMAN Brod   PCP: Berkley Cole, MD   Recommendations at discharge:   Follow-up PCP as outpatient Follow-up hematology as outpatient  Discharge Diagnoses: Principal Problem:   Abscess  Resolved Problems:   * No resolved hospital problems. *  Hospital Course: 53 y.o. female with medical history significant of CHF, Anxiety, COPD on home O2 at night, Depression , GERD , Hypertension, Anemia, hidradenitis who presents to ED with Right underarm abscess x 6days. Per patient she was taking left over antibiotic at home by after 3 days no improvement. Patient states she then followed up at [facility] yesterday. At UC she has I and D of abscess and was then given IM CTX and discharged on Bactrim and keflex. Patient states overnight area of redness as progressed so she returned to wound care for wound check and further evaluation   Assessment and Plan:  Right axillary abscess with associated expanding cellulitis  -Status post incision and drainage on 6/18 at urgent care center -Treated with ceftriaxone  IM, Bactrim and Keflex for less than 24 hours -Due to worsening erythema, came to ED -She has history of hidradenitis suppurativa -Started vancomycin  and Zosyn , vancomycin  stopped after possible red man syndrome -Erythema has significantly improved -Started on Zyvox  and Zosyn , Zyvox  was discontinued and started on p.o. doxycycline  -Discussed with general surgery, packing has been removed.  No need to follow-up. -She will be discharged on Augmentin and doxycycline  for 5 more days. - Lactic acid is down to 1.4, CRP 1.3   CHFpef -well compensated      COPD  -on home O2 at bedtime  -resume home regimen      Pulmonary hypertension  -resume home regimen     Thrombocytopenia/leukopenia -Longstanding since 2023, - Right  upper quadrant ultrasound from 10/23 showed hepatic cirrhosis with splenomegaly - Discussed with hematology, Dr. Timmy - He recommends follow-up as outpatient       Consultants:  Procedures performed:  Disposition: Home Diet recommendation:  Discharge Diet Orders (From admission, onward)     Start     Ordered   12/13/23 0000  Diet - low sodium heart healthy        12/13/23 1038           Regular diet DISCHARGE MEDICATION: Allergies as of 12/13/2023       Reactions   Tyvaso [treprostinil] Cough   Headaches    Vancomycin  Itching   Itching/burning sensation all over; no rash   Lexapro [escitalopram] Other (See Comments)   Made me crazy    Lisinopril  Other (See Comments)   Angioedema        Medication List     STOP taking these medications    cephALEXin 500 MG capsule Commonly known as: KEFLEX   sulfamethoxazole-trimethoprim 800-160 MG tablet Commonly known as: BACTRIM DS       TAKE these medications    acetaminophen  500 MG tablet Commonly known as: TYLENOL  Take 500-1,000 mg by mouth every 6 (six) hours as needed for mild pain or headache.   amoxicillin-clavulanate 875-125 MG tablet Commonly known as: AUGMENTIN Take 1 tablet by mouth 2 (two) times daily for 5 days.   clonazePAM  0.5 MG tablet Commonly known as: KLONOPIN  Take 0.25-0.5 mg by mouth 2 (two) times daily as needed for anxiety.   doxycycline  100 MG tablet Commonly known as: VIBRA -TABS  Take 1 tablet (100 mg total) by mouth every 12 (twelve) hours for 5 days.   furosemide  40 MG tablet Commonly known as: LASIX  Take 2 tablets (80 mg total) by mouth daily.   metoprolol  succinate 100 MG 24 hr tablet Commonly known as: TOPROL -XL Take 1 tablet (100 mg total) by mouth daily.   omeprazole 20 MG capsule Commonly known as: PRILOSEC Take 20 mg by mouth daily before breakfast.   Opsumit  10 MG tablet Generic drug: macitentan  Take 1 tablet (10 mg total) by mouth in the morning.    Potassium Chloride  ER 20 MEQ Tbcr Take 2 tablets by mouth once daily   pravastatin  20 MG tablet Commonly known as: PRAVACHOL  Take 20 mg by mouth in the morning.   Selexipag  1600 MCG Tabs Take 1,600 mcg by mouth in the morning and at bedtime.   sertraline  100 MG tablet Commonly known as: ZOLOFT  Take 150 mg by mouth daily.   spironolactone  25 MG tablet Commonly known as: ALDACTONE  Take 1 tablet by mouth once daily   Vitamin D  (Ergocalciferol ) 1.25 MG (50000 UNIT) Caps capsule Commonly known as: DRISDOL  Take 1 capsule (50,000 Units total) by mouth every 7 (seven) days.        Follow-up Information     Berkley Cole, MD Follow up.   Specialty: Family Medicine Contact information: [address] [place] [address] [phone]         Timmy Maude SAUNDERS, MD. Schedule an appointment as soon as possible for a visit.   Specialty: Oncology Contact information: [address] [place] [address] [phone]                Discharge Exam: Pamela Ho   12/11/23 1812  Weight: 93 kg   General-appears in no acute distress Heart-S1-S2, regular, no murmur auscultated Lungs-clear to auscultation bilaterally, no wheezing or crackles auscultated Abdomen-soft, nontender, no organomegaly Extremities-no edema in the lower extremities Neuro-alert, oriented x3, no focal deficit noted Skin-gaping wound noted at right axilla, packing removed.  No pus or serous discharge noted.  Condition at discharge: good  The results of significant diagnostics from this hospitalization (including imaging, microbiology, ancillary and laboratory) are listed below for reference.   Imaging Studies: No results found.  Microbiology: Results for orders placed or performed during the hospital encounter of 12/11/23  Blood culture (routine x 2)     Status: None (Preliminary result)   Collection Time: 12/11/23  8:52 PM   Specimen: BLOOD  Result Value Ref Range Status    Specimen Description   Final    BLOOD SITE NOT SPECIFIED Performed at [facility], [address], [place], [place] [address]    Special Requests   Final    BOTTLES DRAWN AEROBIC AND ANAEROBIC Blood Culture results may not be optimal due to an inadequate volume of blood received in culture bottles Performed at [facility], [address], [place], [place] [address]    Culture   Final    NO GROWTH 2 DAYS Performed at [facility], [address], [place], [place] [address]    Report Status PENDING  Incomplete  Blood culture (routine x 2)     Status: None (Preliminary result)   Collection Time: 12/11/23  9:30 PM   Specimen: BLOOD LEFT HAND  Result Value Ref Range Status   Specimen Description   Final    BLOOD LEFT HAND Performed at [facility], [address], [place], [place] [address]    Special Requests   Final    BOTTLES DRAWN AEROBIC AND ANAEROBIC Blood Culture results may not be optimal due to an inadequate volume of blood received in culture bottles Performed at [facility], [address] [place], [place] [address]    Culture   Final    NO GROWTH 1 DAY Performed at [facility], [address] [place], [place] [address]    Report Status PENDING  Incomplete  MRSA Next Gen by PCR, Nasal     Status: None   Collection Time: 12/11/23  9:35 PM   Specimen: Nasal Mucosa; Nasal Swab  Result Value Ref Range Status   MRSA by PCR Next Gen NOT DETECTED NOT DETECTED Final    Comment: (NOTE) The GeneXpert MRSA Assay (FDA approved for NASAL specimens only), is one component of a comprehensive MRSA colonization surveillance program. It is not intended to diagnose MRSA infection nor to guide or monitor treatment for MRSA infections. Test performance is not FDA approved in patients less than 68 years old. Performed at [facility], [address], [place], [place] [address]      Labs: CBC: Recent Labs  Lab 12/10/23 1747 12/11/23 1825 12/11/23 2052 12/12/23 0350 12/13/23 0349  WBC 3.8 3.5* 3.3* 2.5* 2.1*  NEUTROABS 2.8 2.6  --   --  1.3*  HGB 11.9 11.6* 12.0 10.2* 10.2*  HCT 42.7 41.6 41.3 36.7 36.4  MCV 79 77.6* 76.1* 79.4* 79.0*  PLT 125* 105* 108* 90* 86*   Basic Metabolic Panel: Recent Labs  Lab 12/11/23 1825 12/11/23 2052 12/12/23 0350 12/13/23 0349  NA 141  --  139 140  K 4.0  --  3.4* 3.6  CL 101  --  105 106  CO2 27  --  24 27  GLUCOSE 160*  --  157* 135*  BUN 12  --  12 12  CREATININE 0.96 1.05* 1.03* 0.75  CALCIUM 9.6  --  9.4 9.5   Liver Function Tests: Recent Labs  Lab 12/11/23 1825 12/12/23 0350  AST 16 12*  ALT 18 16  ALKPHOS 93 72  BILITOT 0.6 0.8  PROT 7.1 5.9*  ALBUMIN  3.6 3.0*   CBG: No results for input(s): GLUCAP in the last 168 hours.  Discharge time spent: greater than 30 minutes.  Signed: Sabas GORMAN Brod, MD [facility] 12/13/2023

## 2023-12-13 NOTE — Progress Notes (Signed)
 Patient's RUE with redness and mild swelling.  RUE redness has been marked, redness noted to be receding-no redness noted outside of marked borders.  Skin is also noted to be dry and flaky.  R axilla with packing-packed at an outside facility on 12/10/2023.  Dr. Drusilla at beside-RUE axilla packing removed.  Small amount of drainage noted on packing dressing. No odor.  No active bleeding or drainage noted. Patient denies pain. Per provider's request new 4x4 dressing and tape.  Rendall JINNY Quale, RN 11:32 AM 93/78/74

## 2023-12-16 LAB — CULTURE, BLOOD (ROUTINE X 2): Culture: NO GROWTH

## 2023-12-17 LAB — CULTURE, BLOOD (ROUTINE X 2): Culture: NO GROWTH

## 2024-01-06 ENCOUNTER — Telehealth: Payer: Self-pay | Admitting: Physician Assistant

## 2024-01-06 DIAGNOSIS — M2540 Effusion, unspecified joint: Secondary | ICD-10-CM

## 2024-01-06 MED ORDER — PREDNISONE 10 MG (21) PO TBPK
ORAL_TABLET | Freq: Every day | ORAL | 0 refills | Status: AC
Start: 2024-01-06 — End: ?

## 2024-01-06 NOTE — Progress Notes (Deleted)
 Virtual Visit Consent   Pamela Ho, you are scheduled for a virtual visit with a [facility] provider today. Just as with appointments in the office, your consent must be obtained to participate. Your consent will be active for this visit and any virtual visit you may have with one of our providers in the next 365 days. If you have a MyChart account, a copy of this consent can be sent to you electronically.  As this is a virtual visit, video technology does not allow for your provider to perform a traditional examination. This may limit your provider's ability to fully assess your condition. If your provider identifies any concerns that need to be evaluated in person or the need to arrange testing (such as labs, EKG, etc.), we will make arrangements to do so. Although advances in technology are sophisticated, we cannot ensure that it will always work on either your end or our end. If the connection with a video visit is poor, the visit may have to be switched to a telephone visit. With either a video or telephone visit, we are not always able to ensure that we have a secure connection.  By engaging in this virtual visit, you consent to the provision of healthcare and authorize for your insurance to be billed (if applicable) for the services provided during this visit. Depending on your insurance coverage, you may receive a charge related to this service.  I need to obtain your verbal consent now. Are you willing to proceed with your visit today? Pamela Ho has provided verbal consent on 01/06/2024 for a virtual visit (video or telephone). Pamela Ho, [place]  Date: 01/06/2024 3:28 PM   Virtual Visit via Video Note   I, Pamela Ho, connected with  Pamela Ho  ([id number], Aug 26, 1970) on 01/06/24 at  4:30 PM EDT by a video-enabled telemedicine application and verified that I am speaking with the correct person using two identifiers.  Location: Patient:  Virtual Visit Location Patient: Home Provider: Virtual Visit Location Provider: Home Office   I discussed the limitations of evaluation and management by telemedicine and the availability of in person appointments. The patient expressed understanding and agreed to proceed.    History of Present Illness: Pamela Ho is a 53 y.o. who identifies as a female who was assigned female at birth, and is being seen today for ***.  HPI: HPI  Problems:  Patient Active Problem List   Diagnosis Date Noted   Abscess 12/11/2023   Rheumatism 10/09/2021   Heart failure, type unknown (HCC) 08/30/2021   Thrombocytopenia (HCC) 08/10/2021   Adrenal mass (HCC) 08/09/2021   Pleural effusion on right 08/09/2021   Acute respiratory failure with hypoxia (HCC) 08/07/2021   AKI (acute kidney injury) (HCC) 08/07/2021   Glucose intolerance 08/07/2021   Cirrhosis of liver (HCC) 08/06/2021   Generalized anxiety disorder 08/06/2021   GERD without esophagitis 08/06/2021   Hypokalemia 08/06/2021   Chronic diastolic heart failure (HCC) 08/06/2021   Elevated troponin level not due myocardial infarction 08/05/2021   Nicotine dependence, cigarettes, uncomplicated 08/05/2021   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   Hypertensive urgency 02/08/2015   Headache 02/08/2015    Allergies:  Allergies  Allergen Reactions   Tyvaso [Treprostinil] Cough    Headaches    Vancomycin  Itching    Itching/burning sensation all over; no rash   Lexapro [Escitalopram] Other (See Comments)    Made me crazy    Lisinopril  Other (See Comments)    Angioedema  Medications:  Current Outpatient Medications:    acetaminophen  (TYLENOL ) 500 MG tablet, Take 500-1,000 mg by mouth every 6 (six) hours as needed for mild pain or headache., Disp: , Rfl:    clonazePAM  (KLONOPIN ) 0.5 MG tablet, Take 0.25-0.5 mg by mouth 2 (two) times daily as needed for anxiety., Disp: , Rfl:    furosemide  (LASIX ) 40 MG tablet, Take 2 tablets (80  mg total) by mouth daily., Disp: 180 tablet, Rfl: 3   macitentan  (OPSUMIT ) 10 MG tablet, Take 1 tablet (10 mg total) by mouth in the morning., Disp: 30 tablet, Rfl: 11   metoprolol  succinate (TOPROL -XL) 100 MG 24 hr tablet, Take 1 tablet (100 mg total) by mouth daily., Disp: 90 tablet, Rfl: 3   omeprazole (PRILOSEC) 20 MG capsule, Take 20 mg by mouth daily before breakfast., Disp: , Rfl:    Potassium Chloride  ER 20 MEQ TBCR, Take 2 tablets by mouth once daily, Disp: 180 tablet, Rfl: 3   pravastatin  (PRAVACHOL ) 20 MG tablet, Take 20 mg by mouth in the morning., Disp: , Rfl:    Selexipag  1600 MCG TABS, Take 1,600 mcg by mouth in the morning and at bedtime., Disp: , Rfl:    sertraline  (ZOLOFT ) 100 MG tablet, Take 150 mg by mouth daily., Disp: , Rfl:    spironolactone  (ALDACTONE ) 25 MG tablet, Take 1 tablet by mouth once daily, Disp: 90 tablet, Rfl: 3   Vitamin D , Ergocalciferol , (DRISDOL ) 1.25 MG (50000 UNIT) CAPS capsule, Take 1 capsule (50,000 Units total) by mouth every 7 (seven) days., Disp: 16 capsule, Rfl: 0  Observations/Objective: Patient is well-developed, well-nourished in no acute distress.  Resting comfortably *** at home.  Head is normocephalic, atraumatic.  No labored breathing. *** Speech is clear and coherent with logical content.  Patient is alert and oriented at baseline.  ***  Assessment and Plan: There are no diagnoses linked to this encounter. ***  Follow Up Instructions: I discussed the assessment and treatment plan with the patient. The patient was provided an opportunity to ask questions and all were answered. The patient agreed with the plan and demonstrated an understanding of the instructions.  A copy of instructions were sent to the patient via MyChart unless otherwise noted below.   {EMAIL AVS:[id number]::Patient has requested to receive PHI (AVS, Work Notes, etc) pertaining to this video visit through e-mail as they are currently without active MyChart. They have  voiced understand that email is not considered secure and their health information could be viewed by someone other than the patient. }  The patient was advised to call back or seek an in-person evaluation if the symptoms worsen or if the condition fails to improve as anticipated.    Pamela Velma Lunger, PA-C

## 2024-01-06 NOTE — Patient Instructions (Signed)
 Pamela Ho, thank you for joining Lynden GORMAN Snuffer, PA-C for today's virtual visit.  While this provider is not your primary care provider (PCP), if your PCP is located in our provider database this encounter information will be shared with them immediately following your visit.   A [facility] MyChart account gives you access to today's visit and all your visits, tests, and labs performed at [facility]  click here if you don't have a [facility] MyChart account or go to [email or web address]  Consent: (Patient) Pamela Ho provided verbal consent for this virtual visit at the beginning of the encounter.  Current Medications:  Current Outpatient Medications:    acetaminophen  (TYLENOL ) 500 MG tablet, Take 500-1,000 mg by mouth every 6 (six) hours as needed for mild pain or headache., Disp: , Rfl:    clonazePAM  (KLONOPIN ) 0.5 MG tablet, Take 0.25-0.5 mg by mouth 2 (two) times daily as needed for anxiety., Disp: , Rfl:    furosemide  (LASIX ) 40 MG tablet, Take 2 tablets (80 mg total) by mouth daily., Disp: 180 tablet, Rfl: 3   macitentan  (OPSUMIT ) 10 MG tablet, Take 1 tablet (10 mg total) by mouth in the morning., Disp: 30 tablet, Rfl: 11   metoprolol  succinate (TOPROL -XL) 100 MG 24 hr tablet, Take 1 tablet (100 mg total) by mouth daily., Disp: 90 tablet, Rfl: 3   omeprazole (PRILOSEC) 20 MG capsule, Take 20 mg by mouth daily before breakfast., Disp: , Rfl:    Potassium Chloride  ER 20 MEQ TBCR, Take 2 tablets by mouth once daily, Disp: 180 tablet, Rfl: 3   pravastatin  (PRAVACHOL ) 20 MG tablet, Take 20 mg by mouth in the morning., Disp: , Rfl:    Selexipag  1600 MCG TABS, Take 1,600 mcg by mouth in the morning and at bedtime., Disp: , Rfl:    sertraline  (ZOLOFT ) 100 MG tablet, Take 150 mg by mouth daily., Disp: , Rfl:    spironolactone  (ALDACTONE ) 25 MG tablet, Take 1 tablet by mouth once daily, Disp: 90 tablet, Rfl: 3   Vitamin D , Ergocalciferol , (DRISDOL )  1.25 MG (50000 UNIT) CAPS capsule, Take 1 capsule (50,000 Units total) by mouth every 7 (seven) days., Disp: 16 capsule, Rfl: 0   Medications ordered in this encounter:  No orders of the defined types were placed in this encounter.    *If you need refills on other medications prior to your next appointment, please contact your pharmacy*  Follow-Up: Call back or seek an in-person evaluation if the symptoms worsen or if the condition fails to improve as anticipated.  [facility] [phone]  Other Instructions Take prednisone  as directed   Follow up with your regular doctor in 1 week for reassessment and seek care sooner if your symptoms worsen or fail to improve.   If you have been instructed to have an in-person evaluation today at a local Urgent Care facility, please use the link below. It will take you to a list of all of our available [facility], including address, phone number and hours of operation. Please do not delay care.  [facility]  If you or a family member do not have a primary care provider, use the link below to schedule a visit and establish care. When you choose a [facility] primary care physician or advanced practice provider, you gain a long-term partner in health. Find a Primary Care Provider  Learn more about [facility]'s in-office and virtual care options: [facility] - Get  Care Now

## 2024-01-06 NOTE — Progress Notes (Signed)
 [name], Pamela Ho are scheduled for a virtual visit with your provider today.    Just as we do with appointments in the office, we must obtain your consent to participate.  Your consent will be active for this visit and any virtual visit you may have with one of our providers in the next 365 days.    If you have a MyChart account, I can also send a copy of this consent to you electronically.  All virtual visits are billed to your insurance company just like a traditional visit in the office.  As this is a virtual visit, video technology does not allow for your provider to perform a traditional examination.  This may limit your provider's ability to fully assess your condition.  If your provider identifies any concerns that need to be evaluated in person or the need to arrange testing such as labs, EKG, etc, we will make arrangements to do so.    Although advances in technology are sophisticated, we cannot ensure that it will always work on either your end or our end.  If the connection with a video visit is poor, we may have to switch to a telephone visit.  With either a video or telephone visit, we are not always able to ensure that we have a secure connection.   I need to obtain your verbal consent now.   Are you willing to proceed with your visit today?   [name] has provided verbal consent on 01/06/2024 for a virtual visit (video or telephone).   Lynden GORMAN Snuffer, [place] 01/06/2024  4:42 PM   Date:  01/06/2024   ID:  Pamela Ho, DOB 17-Dec-1970, MRN [id number]  Patient Location: Home Provider Location: Home Office   Participants: Patient and Provider for Visit and Wrap up  Method of visit: Video  Location of Patient: Home Location of Provider: Home Office Consent was obtain for visit over the video. Services rendered by provider: Visit was performed via video  A video enabled telemedicine application was used and I verified that I am speaking with the correct person  using two identifiers.  PCP:  Berkley Cole, MD   Chief Complaint:  joint swelling/pain   History of Present Illness:    Pamela Ho is a 53 y.o. female with history as stated below. Presents video telehealth for an acute care visit  Pt states that 1 week ago she had a fever of 101F and fatigue for 2 days. She also experienced rhinorrhea, congestion, cough.  She reports she also started having joint swelling and pain to her hands/wrists/fingers and toes/feet/ankles. States that the swelling in her feet has improved. She reports she still has stiffness and soreness in her hands as well.   Past Medical, Surgical, Social History, Allergies, and Medications have been Reviewed.  Past Medical History:  Diagnosis Date   Acute congestive heart failure (HCC) 08/05/2021   Anemia    Anxiety    COPD (chronic obstructive pulmonary disease) (HCC)    Depression    GERD (gastroesophageal reflux disease)    Heart murmur    Hypertension     Current Meds  Medication Sig   predniSONE  (STERAPRED UNI-PAK 21 TAB) 10 MG (21) TBPK tablet Take by mouth daily. Take 6 tabs by mouth daily  for 2 days, then 5 tabs for 2 days, then 4 tabs for 2 days, then 3 tabs for 2 days, 2 tabs for 2 days, then 1 tab by mouth daily for 2 days  Allergies:   Tyvaso [treprostinil], Vancomycin , Lexapro [escitalopram], and Lisinopril    ROS See HPI for history of present illness.  Physical Exam Constitutional:      Appearance: Normal appearance. She is not ill-appearing.  Neurological:     Mental Status: She is alert.              MDM: Pt with joint swelling to hands and feet after viral infection. Patient has had a partial rheumatologic workup but has yet to follow up with rheumatology. Suspect this may be related to autoimmune/rheumatologic disorder. Recommended follow up with pcp and rheum. Will tx with prednisone  taper.    Tests Ordered: No orders of the defined types were placed in this  encounter.   Medication Changes: Meds ordered this encounter  Medications   predniSONE  (STERAPRED UNI-PAK 21 TAB) 10 MG (21) TBPK tablet    Sig: Take by mouth daily. Take 6 tabs by mouth daily  for 2 days, then 5 tabs for 2 days, then 4 tabs for 2 days, then 3 tabs for 2 days, 2 tabs for 2 days, then 1 tab by mouth daily for 2 days    Dispense:  42 tablet    Refill:  0     Disposition:  Follow up  Signed, Lynden GORMAN Snuffer, PA-C  01/06/2024 4:42 PM

## 2024-01-08 ENCOUNTER — Inpatient Hospital Stay: Payer: MEDICAID | Admitting: Hematology & Oncology

## 2024-01-08 ENCOUNTER — Inpatient Hospital Stay: Payer: MEDICAID | Attending: Hematology & Oncology

## 2024-01-09 ENCOUNTER — Encounter: Payer: Self-pay | Admitting: Advanced Practice Midwife

## 2024-01-26 ENCOUNTER — Inpatient Hospital Stay: Payer: Self-pay | Admitting: Internal Medicine

## 2024-01-26 ENCOUNTER — Inpatient Hospital Stay: Payer: Self-pay

## 2024-02-03 ENCOUNTER — Inpatient Hospital Stay: Payer: Self-pay | Attending: Internal Medicine | Admitting: Internal Medicine

## 2024-02-03 ENCOUNTER — Inpatient Hospital Stay: Payer: Self-pay

## 2024-04-22 ENCOUNTER — Other Ambulatory Visit (HOSPITAL_COMMUNITY): Payer: Self-pay | Admitting: Cardiology

## 2024-04-22 MED ORDER — OPSUMIT 10 MG PO TABS: 10.0000 mg | ORAL_TABLET | Freq: Every morning | ORAL | 11 refills | Status: DC

## 2024-04-23 ENCOUNTER — Other Ambulatory Visit (HOSPITAL_COMMUNITY): Payer: Self-pay

## 2024-04-23 DIAGNOSIS — I5032 Chronic diastolic (congestive) heart failure: Secondary | ICD-10-CM

## 2024-04-23 MED ORDER — OPSUMIT 10 MG PO TABS: 10.0000 mg | ORAL_TABLET | Freq: Every morning | ORAL | 0 refills | Status: DC

## 2024-05-11 ENCOUNTER — Other Ambulatory Visit (HOSPITAL_COMMUNITY): Payer: Self-pay | Admitting: Cardiology

## 2024-05-11 MED ORDER — SELEXIPAG 1600 MCG PO TABS: 1600.0000 ug | ORAL_TABLET | Freq: Two times a day (BID) | ORAL | 11 refills | Status: DC

## 2024-06-01 ENCOUNTER — Other Ambulatory Visit (HOSPITAL_COMMUNITY): Payer: Self-pay | Admitting: Cardiology

## 2024-06-01 ENCOUNTER — Other Ambulatory Visit (HOSPITAL_COMMUNITY): Payer: Self-pay

## 2024-06-01 MED ORDER — SELEXIPAG 1600 MCG PO TABS: 1600.0000 ug | ORAL_TABLET | Freq: Two times a day (BID) | ORAL | 3 refills | Status: AC

## 2024-06-02 ENCOUNTER — Other Ambulatory Visit (HOSPITAL_COMMUNITY): Payer: Self-pay

## 2024-06-02 ENCOUNTER — Telehealth (HOSPITAL_COMMUNITY): Payer: Self-pay

## 2024-06-02 NOTE — Telephone Encounter (Signed)
 Advanced Heart Failure Patient Advocate Encounter  The patient was approved for a [employer] grant that will help cover the cost of Opsumit , Uptravi .  Total amount awarded, $6,500.  Effective: 05/03/2024 - 05/02/2025.  BIN [id number] PCN [id number] [id number] ID [id number]  Pharmacy confirmed copay for Uptravi  is already $0, and will be delivered to patient tomorrow. Lorrene not needed at this time, but will be available if patient needs access to funds in the future. Patient informed via paramedicine.   Rachel DEL, CPhT Rx Patient Advocate Phone: [phone]

## 2024-07-02 ENCOUNTER — Other Ambulatory Visit (HOSPITAL_COMMUNITY): Payer: Self-pay

## 2024-07-02 ENCOUNTER — Telehealth (HOSPITAL_COMMUNITY): Payer: Self-pay

## 2024-07-02 DIAGNOSIS — I5032 Chronic diastolic (congestive) heart failure: Secondary | ICD-10-CM

## 2024-07-02 NOTE — Telephone Encounter (Signed)
 Advanced Heart Failure Patient Advocate Encounter  Patient contacted office regarding Opsumit . Patient is having trouble refilling with [facility]. Contacted [employer] who transferred me to [facility]. [facility] confirmed that due to change in insurance patient will now need to fill with [facility]. Provided updated insurance information, [facility] will [facility] to [facility].  Informed patient by phone, provided patient with grant information in case [facility] reaches out over the weekend to ship medication. If not I will follow up with [facility] on Monday to expedite medication to patient if possible.  Rachel DEL, CPhT Rx Patient Advocate Phone: [phone]

## 2024-07-05 ENCOUNTER — Other Ambulatory Visit (HOSPITAL_COMMUNITY): Payer: Self-pay

## 2024-07-05 MED ORDER — OPSUMIT 10 MG PO TABS: 10.0000 mg | ORAL_TABLET | Freq: Every morning | ORAL | 0 refills | Status: AC

## 2024-07-05 NOTE — Telephone Encounter (Signed)
 Patient has not heard from [facility] yet. New rx for Opsumit  sent directly to [facility] today 07/05/2024. I have spoken with [employer], they are marking triage as urgent for the patient and are forwarding active refills to [facility] as well. Patient informed by phone.

## 2024-07-09 ENCOUNTER — Telehealth (HOSPITAL_COMMUNITY): Payer: Self-pay | Admitting: Pharmacist

## 2024-07-09 ENCOUNTER — Other Ambulatory Visit (HOSPITAL_COMMUNITY): Payer: Self-pay

## 2024-07-09 NOTE — Telephone Encounter (Signed)
 Advanced Heart Failure Patient Advocate Encounter  Prior Authorization for Uptravi  has been approved (key [id number]).     Effective dates: 07/09/24 through 06/23/25  Tinnie Redman, PharmD, BCPS, BCCP, CPP Heart Failure Clinic Pharmacist [phone]

## 2024-07-09 NOTE — Telephone Encounter (Signed)
 Advanced Heart Failure Patient Advocate Encounter  Prior Authorization for Opsumit  has been approved (key [id number]).    Effective dates: 07/09/2024 through 06/23/2025  Tinnie Redman, PharmD, BCPS, BCCP, CPP Heart Failure Clinic Pharmacist [phone]

## 2024-07-16 ENCOUNTER — Other Ambulatory Visit (HOSPITAL_COMMUNITY): Payer: Self-pay | Admitting: Cardiology

## 2024-07-16 DIAGNOSIS — I5032 Chronic diastolic (congestive) heart failure: Secondary | ICD-10-CM

## 2024-07-30 ENCOUNTER — Other Ambulatory Visit: Payer: Self-pay | Admitting: Family Medicine

## 2024-07-30 DIAGNOSIS — Z1231 Encounter for screening mammogram for malignant neoplasm of breast: Secondary | ICD-10-CM
